# Patient Record
Sex: Male | Born: 1935 | Race: White | Hispanic: No | Marital: Married | Smoking: Former smoker
Health system: Southern US, Community
[De-identification: ages and names within clinical notes are randomized; demographics above are authoritative.]

## PROBLEM LIST (undated history)

## (undated) DIAGNOSIS — R011 Cardiac murmur, unspecified: Secondary | ICD-10-CM

## (undated) DIAGNOSIS — J45909 Unspecified asthma, uncomplicated: Secondary | ICD-10-CM

## (undated) DIAGNOSIS — C61 Malignant neoplasm of prostate: Secondary | ICD-10-CM

## (undated) DIAGNOSIS — E785 Hyperlipidemia, unspecified: Secondary | ICD-10-CM

## (undated) DIAGNOSIS — R0602 Shortness of breath: Secondary | ICD-10-CM

## (undated) DIAGNOSIS — M869 Osteomyelitis, unspecified: Secondary | ICD-10-CM

## (undated) HISTORY — DX: Osteomyelitis, unspecified: M86.9

## (undated) HISTORY — PX: EXCISIONAL HEMORRHOIDECTOMY: SHX1541

## (undated) HISTORY — DX: Malignant neoplasm of prostate: C61

## (undated) HISTORY — PX: TONSILLECTOMY: SUR1361

## (undated) HISTORY — DX: Hypocalcemia: E83.51

---

## 2004-12-29 ENCOUNTER — Ambulatory Visit: Payer: Self-pay | Admitting: Urology

## 2005-01-18 ENCOUNTER — Ambulatory Visit: Payer: Self-pay | Admitting: Radiation Oncology

## 2005-02-04 ENCOUNTER — Ambulatory Visit: Payer: Self-pay | Admitting: Radiation Oncology

## 2005-03-07 ENCOUNTER — Ambulatory Visit: Payer: Self-pay | Admitting: Radiation Oncology

## 2005-04-06 ENCOUNTER — Ambulatory Visit: Payer: Self-pay | Admitting: Radiation Oncology

## 2005-05-07 ENCOUNTER — Ambulatory Visit: Payer: Self-pay | Admitting: Radiation Oncology

## 2005-07-09 ENCOUNTER — Ambulatory Visit: Payer: Self-pay | Admitting: Radiation Oncology

## 2005-08-05 ENCOUNTER — Ambulatory Visit: Payer: Self-pay | Admitting: Radiation Oncology

## 2005-11-08 ENCOUNTER — Ambulatory Visit: Payer: Self-pay | Admitting: Radiation Oncology

## 2005-12-05 ENCOUNTER — Ambulatory Visit: Payer: Self-pay | Admitting: Radiation Oncology

## 2005-12-27 ENCOUNTER — Ambulatory Visit: Payer: Self-pay | Admitting: Urology

## 2006-01-09 ENCOUNTER — Ambulatory Visit: Payer: Self-pay | Admitting: Radiation Oncology

## 2006-02-04 ENCOUNTER — Ambulatory Visit: Payer: Self-pay | Admitting: Radiation Oncology

## 2006-08-14 ENCOUNTER — Ambulatory Visit: Payer: Self-pay | Admitting: Radiation Oncology

## 2006-09-05 ENCOUNTER — Ambulatory Visit: Payer: Self-pay | Admitting: Radiation Oncology

## 2006-10-14 ENCOUNTER — Ambulatory Visit: Payer: Self-pay | Admitting: Gastroenterology

## 2007-02-19 ENCOUNTER — Ambulatory Visit: Payer: Self-pay | Admitting: Radiation Oncology

## 2007-03-08 ENCOUNTER — Ambulatory Visit: Payer: Self-pay | Admitting: Radiation Oncology

## 2007-06-08 ENCOUNTER — Ambulatory Visit: Payer: Self-pay | Admitting: Radiation Oncology

## 2007-06-11 ENCOUNTER — Ambulatory Visit: Payer: Self-pay | Admitting: Radiation Oncology

## 2007-07-06 ENCOUNTER — Ambulatory Visit: Payer: Self-pay | Admitting: Radiation Oncology

## 2007-12-06 ENCOUNTER — Ambulatory Visit: Payer: Self-pay | Admitting: Radiation Oncology

## 2007-12-08 ENCOUNTER — Ambulatory Visit: Payer: Self-pay | Admitting: Radiation Oncology

## 2008-01-06 ENCOUNTER — Ambulatory Visit: Payer: Self-pay | Admitting: Radiation Oncology

## 2009-01-04 ENCOUNTER — Ambulatory Visit: Payer: Self-pay | Admitting: Gastroenterology

## 2009-02-04 ENCOUNTER — Ambulatory Visit: Payer: Self-pay | Admitting: Radiation Oncology

## 2009-03-07 ENCOUNTER — Ambulatory Visit: Payer: Self-pay | Admitting: Radiation Oncology

## 2009-08-05 ENCOUNTER — Ambulatory Visit: Payer: Self-pay | Admitting: Radiation Oncology

## 2009-08-10 ENCOUNTER — Ambulatory Visit: Payer: Self-pay | Admitting: Radiation Oncology

## 2009-09-04 ENCOUNTER — Ambulatory Visit: Payer: Self-pay | Admitting: Radiation Oncology

## 2010-01-05 ENCOUNTER — Ambulatory Visit: Payer: Self-pay | Admitting: Radiation Oncology

## 2010-01-11 ENCOUNTER — Ambulatory Visit: Payer: Self-pay | Admitting: Radiation Oncology

## 2010-02-04 ENCOUNTER — Ambulatory Visit: Payer: Self-pay | Admitting: Radiation Oncology

## 2010-02-15 ENCOUNTER — Ambulatory Visit: Payer: Self-pay | Admitting: Urology

## 2010-08-10 ENCOUNTER — Ambulatory Visit: Payer: Self-pay | Admitting: Radiation Oncology

## 2010-08-11 LAB — PSA

## 2010-09-05 ENCOUNTER — Ambulatory Visit: Payer: Self-pay | Admitting: Radiation Oncology

## 2010-12-27 ENCOUNTER — Ambulatory Visit: Payer: Self-pay | Admitting: Radiation Oncology

## 2011-01-06 ENCOUNTER — Ambulatory Visit: Payer: Self-pay | Admitting: Radiation Oncology

## 2011-02-05 ENCOUNTER — Ambulatory Visit: Payer: Self-pay | Admitting: Oncology

## 2011-04-27 ENCOUNTER — Ambulatory Visit: Payer: Self-pay | Admitting: Oncology

## 2011-05-08 ENCOUNTER — Ambulatory Visit: Payer: Self-pay | Admitting: Oncology

## 2011-07-05 ENCOUNTER — Ambulatory Visit: Payer: Self-pay | Admitting: Oncology

## 2011-07-06 ENCOUNTER — Ambulatory Visit: Payer: Self-pay | Admitting: Oncology

## 2011-08-24 ENCOUNTER — Ambulatory Visit: Payer: Self-pay | Admitting: Oncology

## 2011-08-24 LAB — COMPREHENSIVE METABOLIC PANEL
Alkaline Phosphatase: 94 U/L (ref 50–136)
Anion Gap: 9 (ref 7–16)
Calcium, Total: 9.2 mg/dL (ref 8.5–10.1)
Chloride: 102 mmol/L (ref 98–107)
Creatinine: 0.81 mg/dL (ref 0.60–1.30)
EGFR (African American): >60
EGFR (Non-African Amer.): >60
Glucose: 100 mg/dL — ABNORMAL HIGH (ref 65–99)
Osmolality: 271 (ref 275–301)
Sodium: 135 mmol/L — ABNORMAL LOW (ref 136–145)

## 2011-08-24 LAB — CBC CANCER CENTER
Eosinophil #: 0.1 x10 3/mm (ref 0.0–0.7)
Eosinophil %: 1.4 %
Lymphocyte %: 27.3 %
MCH: 30.7 pg (ref 26.0–34.0)
Monocyte #: 0.5 x10 3/mm (ref 0.2–1.0)
Monocyte %: 9.5 %
Neutrophil #: 3.3 x10 3/mm (ref 1.4–6.5)
WBC: 5.5 x10 3/mm (ref 3.8–10.6)

## 2011-08-26 LAB — PSA: PSA: 2.2 ng/mL (ref 0.0–4.0)

## 2011-09-05 ENCOUNTER — Ambulatory Visit: Payer: Self-pay | Admitting: Oncology

## 2012-01-11 ENCOUNTER — Ambulatory Visit: Payer: Self-pay | Admitting: Oncology

## 2012-01-11 LAB — CBC CANCER CENTER
Basophil #: 0.1 x10 3/mm (ref 0.0–0.1)
Basophil %: 1 %
Eosinophil #: 0.1 x10 3/mm (ref 0.0–0.7)
HGB: 12.4 g/dL — ABNORMAL LOW (ref 13.0–18.0)
Lymphocyte #: 1.7 x10 3/mm (ref 1.0–3.6)
Lymphocyte %: 29.1 %
MCH: 30.8 pg (ref 26.0–34.0)
MCHC: 33.6 g/dL (ref 32.0–36.0)
Monocyte #: 0.5 x10 3/mm (ref 0.2–1.0)
Neutrophil #: 3.5 x10 3/mm (ref 1.4–6.5)
Neutrophil %: 60.1 %
Platelet: 217 x10 3/mm (ref 150–440)
RDW: 14.1 % (ref 11.5–14.5)

## 2012-01-11 LAB — COMPREHENSIVE METABOLIC PANEL
Bilirubin,Total: 0.4 mg/dL (ref 0.2–1.0)
Calcium, Total: 10 mg/dL (ref 8.5–10.1)
Chloride: 103 mmol/L (ref 98–107)
Co2: 26 mmol/L (ref 21–32)
Creatinine: 0.8 mg/dL (ref 0.60–1.30)
EGFR (African American): >60
EGFR (Non-African Amer.): >60
Osmolality: 275 (ref 275–301)
Potassium: 4.6 mmol/L (ref 3.5–5.1)
SGPT (ALT): 18 U/L (ref 12–78)
Sodium: 137 mmol/L (ref 136–145)

## 2012-01-12 LAB — PSA: PSA: 6.8 ng/mL — ABNORMAL HIGH (ref 0.0–4.0)

## 2012-02-05 ENCOUNTER — Ambulatory Visit: Payer: Self-pay | Admitting: Oncology

## 2012-02-19 ENCOUNTER — Ambulatory Visit: Payer: Self-pay | Admitting: Gastroenterology

## 2012-02-21 LAB — PATHOLOGY REPORT

## 2012-03-07 ENCOUNTER — Ambulatory Visit: Payer: Self-pay | Admitting: Oncology

## 2012-04-24 ENCOUNTER — Ambulatory Visit: Payer: Self-pay | Admitting: Oncology

## 2012-04-24 LAB — CBC CANCER CENTER
Basophil %: 0.8 %
Eosinophil %: 1.1 %
HCT: 36.6 % — ABNORMAL LOW (ref 40.0–52.0)
HGB: 12.6 g/dL — ABNORMAL LOW (ref 13.0–18.0)
Lymphocyte #: 1.9 x10 3/mm (ref 1.0–3.6)
MCHC: 34.5 g/dL (ref 32.0–36.0)
Monocyte #: 0.6 x10 3/mm (ref 0.2–1.0)
Neutrophil #: 5 x10 3/mm (ref 1.4–6.5)
Platelet: 208 x10 3/mm (ref 150–440)
RDW: 14.3 % (ref 11.5–14.5)
WBC: 7.7 x10 3/mm (ref 3.8–10.6)

## 2012-04-24 LAB — COMPREHENSIVE METABOLIC PANEL
Albumin: 3.7 g/dL (ref 3.4–5.0)
Anion Gap: 12 (ref 7–16)
BUN: 17 mg/dL (ref 7–18)
Bilirubin,Total: 0.9 mg/dL (ref 0.2–1.0)
Calcium, Total: 8.9 mg/dL (ref 8.5–10.1)
Chloride: 106 mmol/L (ref 98–107)
Co2: 26 mmol/L (ref 21–32)
EGFR (African American): >60
Osmolality: 288 (ref 275–301)
Potassium: 3.8 mmol/L (ref 3.5–5.1)
SGOT(AST): 16 U/L (ref 15–37)
Total Protein: 7.2 g/dL (ref 6.4–8.2)

## 2012-04-25 LAB — PSA: PSA: 1.9 ng/mL (ref 0.0–4.0)

## 2012-05-07 ENCOUNTER — Ambulatory Visit: Payer: Self-pay | Admitting: Oncology

## 2012-06-13 ENCOUNTER — Ambulatory Visit: Payer: Self-pay | Admitting: Oncology

## 2012-07-05 ENCOUNTER — Ambulatory Visit: Payer: Self-pay | Admitting: Oncology

## 2012-09-01 ENCOUNTER — Inpatient Hospital Stay: Payer: Self-pay | Admitting: Specialist

## 2012-09-01 LAB — CBC
HCT: 35 % — ABNORMAL LOW (ref 40.0–52.0)
MCV: 91 fL (ref 80–100)
Platelet: 245 10*3/uL (ref 150–440)
RDW: 13.3 % (ref 11.5–14.5)
WBC: 8.1 10*3/uL (ref 3.8–10.6)

## 2012-09-01 LAB — COMPREHENSIVE METABOLIC PANEL
Albumin: 3.6 g/dL (ref 3.4–5.0)
Alkaline Phosphatase: 90 U/L (ref 50–136)
Anion Gap: 9 (ref 7–16)
BUN: 13 mg/dL (ref 7–18)
Bilirubin,Total: 0.8 mg/dL (ref 0.2–1.0)
Calcium, Total: 9 mg/dL (ref 8.5–10.1)
EGFR (African American): >60
EGFR (Non-African Amer.): >60

## 2012-09-01 LAB — URINALYSIS, COMPLETE
Blood: NEGATIVE
Glucose,UR: NEGATIVE mg/dL (ref 0–75)
Ph: 8 (ref 4.5–8.0)
Protein: NEGATIVE
RBC,UR: 3 /HPF (ref 0–5)

## 2012-09-02 LAB — CBC WITH DIFFERENTIAL/PLATELET
Eosinophil #: 0 10*3/uL (ref 0.0–0.7)
Eosinophil %: 0 %
HCT: 28.2 % — ABNORMAL LOW (ref 40.0–52.0)
Lymphocyte #: 0.7 10*3/uL — ABNORMAL LOW (ref 1.0–3.6)
MCHC: 35.4 g/dL (ref 32.0–36.0)
MCV: 90 fL (ref 80–100)
Monocyte #: 0.5 x10 3/mm (ref 0.2–1.0)
Monocyte %: 6 %
Neutrophil #: 7.3 10*3/uL — ABNORMAL HIGH (ref 1.4–6.5)
Neutrophil %: 85.9 %
Platelet: 193 10*3/uL (ref 150–440)
RBC: 3.12 10*6/uL — ABNORMAL LOW (ref 4.40–5.90)
RDW: 13.7 % (ref 11.5–14.5)

## 2012-09-02 LAB — BASIC METABOLIC PANEL
BUN: 12 mg/dL (ref 7–18)
Calcium, Total: 8.2 mg/dL — ABNORMAL LOW (ref 8.5–10.1)
Chloride: 106 mmol/L (ref 98–107)
EGFR (Non-African Amer.): >60
Glucose: 130 mg/dL — ABNORMAL HIGH (ref 65–99)
Osmolality: 275 (ref 275–301)
Potassium: 4 mmol/L (ref 3.5–5.1)

## 2012-09-04 ENCOUNTER — Encounter: Payer: Self-pay | Admitting: Endocrinology

## 2012-09-04 LAB — CBC WITH DIFFERENTIAL/PLATELET
Eosinophil #: 0 10*3/uL (ref 0.0–0.7)
Eosinophil %: 0.4 %
HGB: 9.7 g/dL — ABNORMAL LOW (ref 13.0–18.0)
Lymphocyte #: 1.9 10*3/uL (ref 1.0–3.6)
Lymphocyte %: 25 %
Monocyte #: 0.6 x10 3/mm (ref 0.2–1.0)
Monocyte %: 8.2 %
Neutrophil #: 4.9 10*3/uL (ref 1.4–6.5)
Platelet: 194 10*3/uL (ref 150–440)
RBC: 3.06 10*6/uL — ABNORMAL LOW (ref 4.40–5.90)

## 2012-09-04 LAB — BASIC METABOLIC PANEL
Calcium, Total: 8.6 mg/dL (ref 8.5–10.1)
Chloride: 106 mmol/L (ref 98–107)
Co2: 24 mmol/L (ref 21–32)
Creatinine: 0.67 mg/dL (ref 0.60–1.30)
EGFR (African American): >60
EGFR (Non-African Amer.): >60
Glucose: 91 mg/dL (ref 65–99)
Osmolality: 275 (ref 275–301)
Potassium: 3.8 mmol/L (ref 3.5–5.1)

## 2012-09-18 LAB — CBC WITH DIFFERENTIAL/PLATELET
Basophil %: 1.1 %
Eosinophil #: 0.1 10*3/uL (ref 0.0–0.7)
HCT: 34.1 % — ABNORMAL LOW (ref 40.0–52.0)
Lymphocyte #: 1.7 10*3/uL (ref 1.0–3.6)
Lymphocyte %: 20.6 %
MCV: 91 fL (ref 80–100)
Monocyte #: 0.5 x10 3/mm (ref 0.2–1.0)
Monocyte %: 6.5 %
Neutrophil #: 5.8 10*3/uL (ref 1.4–6.5)
Neutrophil %: 70.8 %
WBC: 8.1 10*3/uL (ref 3.8–10.6)

## 2012-09-18 LAB — BASIC METABOLIC PANEL
BUN: 14 mg/dL (ref 7–18)
Calcium, Total: 8.9 mg/dL (ref 8.5–10.1)
Co2: 23 mmol/L (ref 21–32)
EGFR (African American): >60
EGFR (Non-African Amer.): >60
Potassium: 4.2 mmol/L (ref 3.5–5.1)

## 2012-09-19 ENCOUNTER — Ambulatory Visit: Payer: Self-pay | Admitting: Gerontology

## 2012-10-10 ENCOUNTER — Ambulatory Visit: Payer: Self-pay | Admitting: Oncology

## 2012-10-10 LAB — COMPREHENSIVE METABOLIC PANEL
Albumin: 3.3 g/dL — ABNORMAL LOW (ref 3.4–5.0)
BUN: 12 mg/dL (ref 7–18)
Bilirubin,Total: 0.5 mg/dL (ref 0.2–1.0)
Calcium, Total: 8.4 mg/dL — ABNORMAL LOW (ref 8.5–10.1)
Chloride: 105 mmol/L (ref 98–107)
Co2: 30 mmol/L (ref 21–32)
Creatinine: 0.8 mg/dL (ref 0.60–1.30)
EGFR (African American): >60
Glucose: 98 mg/dL (ref 65–99)
Osmolality: 285 (ref 275–301)
Potassium: 3.8 mmol/L (ref 3.5–5.1)

## 2012-10-10 LAB — CBC CANCER CENTER
Basophil %: 1.1 %
Eosinophil #: 0.1 x10 3/mm (ref 0.0–0.7)
Eosinophil %: 1.5 %
HCT: 34.3 % — ABNORMAL LOW (ref 40.0–52.0)
HGB: 11.3 g/dL — ABNORMAL LOW (ref 13.0–18.0)
Lymphocyte #: 1.5 x10 3/mm (ref 1.0–3.6)
Lymphocyte %: 21 %
MCHC: 33.1 g/dL (ref 32.0–36.0)
MCV: 90 fL (ref 80–100)
Monocyte #: 0.5 x10 3/mm (ref 0.2–1.0)
Monocyte %: 7.6 %
Neutrophil #: 4.9 x10 3/mm (ref 1.4–6.5)
Neutrophil %: 68.8 %
RBC: 3.82 10*6/uL — ABNORMAL LOW (ref 4.40–5.90)
RDW: 13.8 % (ref 11.5–14.5)

## 2012-11-04 ENCOUNTER — Ambulatory Visit: Payer: Self-pay | Admitting: Oncology

## 2012-12-05 ENCOUNTER — Ambulatory Visit: Payer: Self-pay | Admitting: Oncology

## 2013-01-05 ENCOUNTER — Ambulatory Visit: Payer: Self-pay | Admitting: Oncology

## 2013-01-09 LAB — COMPREHENSIVE METABOLIC PANEL
Albumin: 3.6 g/dL (ref 3.4–5.0)
BUN: 15 mg/dL (ref 7–18)
Bilirubin,Total: 0.6 mg/dL (ref 0.2–1.0)
Co2: 30 mmol/L (ref 21–32)
EGFR (African American): >60
SGOT(AST): 14 U/L — ABNORMAL LOW (ref 15–37)

## 2013-01-09 LAB — CBC CANCER CENTER
HCT: 35.4 % — ABNORMAL LOW (ref 40.0–52.0)
Lymphocyte #: 1.8 x10 3/mm (ref 1.0–3.6)
Lymphocyte %: 24.2 %
MCH: 29.5 pg (ref 26.0–34.0)
MCHC: 33.2 g/dL (ref 32.0–36.0)
MCV: 89 fL (ref 80–100)
Neutrophil #: 5 x10 3/mm (ref 1.4–6.5)
Platelet: 218 x10 3/mm (ref 150–440)
RDW: 15.4 % — ABNORMAL HIGH (ref 11.5–14.5)
WBC: 7.4 x10 3/mm (ref 3.8–10.6)

## 2013-02-04 ENCOUNTER — Ambulatory Visit: Payer: Self-pay | Admitting: Oncology

## 2013-02-20 LAB — COMPREHENSIVE METABOLIC PANEL WITH GFR
Albumin: 3.4 g/dL
Alkaline Phosphatase: 99 U/L
Anion Gap: 10
BUN: 14 mg/dL
Bilirubin,Total: 0.8 mg/dL
Calcium, Total: 8.8 mg/dL
Chloride: 105 mmol/L
Co2: 28 mmol/L
Creatinine: 0.8 mg/dL
EGFR (African American): >60
EGFR (Non-African Amer.): >60
Glucose: 91 mg/dL
Osmolality: 285
Potassium: 3.8 mmol/L
SGOT(AST): 15 U/L
SGPT (ALT): 22 U/L
Sodium: 143 mmol/L
Total Protein: 7.1 g/dL

## 2013-02-20 LAB — CBC CANCER CENTER
Basophil #: 0.1 "x10 3/mm "
Basophil %: 1.1 %
Eosinophil #: 0.1 "x10 3/mm "
Eosinophil %: 1.2 %
HCT: 37 % — ABNORMAL LOW
HGB: 12.4 g/dL — ABNORMAL LOW
Lymphocyte %: 30 %
Lymphs Abs: 1.8 "x10 3/mm "
MCH: 30.4 pg
MCHC: 33.5 g/dL
MCV: 91 fL
Monocyte #: 0.4 "x10 3/mm "
Monocyte %: 6.7 %
Neutrophil #: 3.8 "x10 3/mm "
Neutrophil %: 61 %
Platelet: 209 "x10 3/mm "
RBC: 4.08 "x10 6/mm " — ABNORMAL LOW
RDW: 15 % — ABNORMAL HIGH
WBC: 6.2 "x10 3/mm "

## 2013-03-07 ENCOUNTER — Ambulatory Visit: Payer: Self-pay | Admitting: Oncology

## 2013-04-24 ENCOUNTER — Ambulatory Visit: Payer: Self-pay | Admitting: Oncology

## 2013-04-24 LAB — COMPREHENSIVE METABOLIC PANEL
Albumin: 3.8 g/dL (ref 3.4–5.0)
Alkaline Phosphatase: 87 U/L
Anion Gap: 9 (ref 7–16)
Bilirubin,Total: 0.9 mg/dL (ref 0.2–1.0)
Calcium, Total: 9.5 mg/dL (ref 8.5–10.1)
Chloride: 103 mmol/L (ref 98–107)
Co2: 28 mmol/L (ref 21–32)
Creatinine: 0.87 mg/dL (ref 0.60–1.30)
EGFR (African American): >60
EGFR (Non-African Amer.): >60
Glucose: 97 mg/dL (ref 65–99)
Osmolality: 281 (ref 275–301)
Total Protein: 7.5 g/dL (ref 6.4–8.2)

## 2013-04-24 LAB — CBC CANCER CENTER
Basophil #: 0.1 x10 3/mm (ref 0.0–0.1)
Basophil %: 0.9 %
HCT: 37.6 % — ABNORMAL LOW (ref 40.0–52.0)
Lymphocyte #: 2.4 x10 3/mm (ref 1.0–3.6)
Lymphocyte %: 26.1 %
MCHC: 33.6 g/dL (ref 32.0–36.0)
MCV: 91 fL (ref 80–100)
Neutrophil #: 5.9 x10 3/mm (ref 1.4–6.5)
Neutrophil %: 65 %
Platelet: 227 x10 3/mm (ref 150–440)
RBC: 4.13 10*6/uL — ABNORMAL LOW (ref 4.40–5.90)

## 2013-05-07 ENCOUNTER — Ambulatory Visit: Payer: Self-pay | Admitting: Oncology

## 2013-07-03 ENCOUNTER — Ambulatory Visit: Payer: Self-pay | Admitting: Oncology

## 2013-07-03 LAB — CBC CANCER CENTER
Basophil #: 0.1 x10 3/mm (ref 0.0–0.1)
Basophil %: 0.9 %
EOS PCT: 1.4 %
Eosinophil #: 0.1 x10 3/mm (ref 0.0–0.7)
HCT: 35.9 % — ABNORMAL LOW (ref 40.0–52.0)
HGB: 12.1 g/dL — AB (ref 13.0–18.0)
Lymphocyte #: 2.4 x10 3/mm (ref 1.0–3.6)
Lymphocyte %: 37 %
MCH: 30.7 pg (ref 26.0–34.0)
MCHC: 33.6 g/dL (ref 32.0–36.0)
MCV: 91 fL (ref 80–100)
Monocyte #: 0.4 x10 3/mm (ref 0.2–1.0)
Monocyte %: 6.8 %
NEUTROS PCT: 53.9 %
Neutrophil #: 3.4 x10 3/mm (ref 1.4–6.5)
PLATELETS: 240 x10 3/mm (ref 150–440)
RBC: 3.93 10*6/uL — ABNORMAL LOW (ref 4.40–5.90)
RDW: 14.1 % (ref 11.5–14.5)
WBC: 6.4 x10 3/mm (ref 3.8–10.6)

## 2013-07-03 LAB — COMPREHENSIVE METABOLIC PANEL
ALBUMIN: 3.5 g/dL (ref 3.4–5.0)
ALK PHOS: 90 U/L
ANION GAP: 9 (ref 7–16)
BILIRUBIN TOTAL: 0.7 mg/dL (ref 0.2–1.0)
BUN: 11 mg/dL (ref 7–18)
Calcium, Total: 8.9 mg/dL (ref 8.5–10.1)
Chloride: 105 mmol/L (ref 98–107)
Co2: 27 mmol/L (ref 21–32)
Creatinine: 0.81 mg/dL (ref 0.60–1.30)
EGFR (African American): >60
GLUCOSE: 99 mg/dL (ref 65–99)
Osmolality: 281 (ref 275–301)
POTASSIUM: 3.9 mmol/L (ref 3.5–5.1)
SGOT(AST): 15 U/L (ref 15–37)
SGPT (ALT): 22 U/L (ref 12–78)
Sodium: 141 mmol/L (ref 136–145)
Total Protein: 7 g/dL (ref 6.4–8.2)

## 2013-07-04 LAB — PSA: PSA: 0.1 ng/mL

## 2013-07-05 ENCOUNTER — Ambulatory Visit: Payer: Self-pay | Admitting: Oncology

## 2013-09-18 ENCOUNTER — Ambulatory Visit: Payer: Self-pay | Admitting: Oncology

## 2013-09-18 LAB — CBC CANCER CENTER
Basophil #: 0 x10 3/mm (ref 0.0–0.1)
Basophil %: 0.6 %
EOS ABS: 0.1 x10 3/mm (ref 0.0–0.7)
Eosinophil %: 1 %
HCT: 38.3 % — AB (ref 40.0–52.0)
HGB: 12.8 g/dL — ABNORMAL LOW (ref 13.0–18.0)
LYMPHS ABS: 2.1 x10 3/mm (ref 1.0–3.6)
Lymphocyte %: 33.2 %
MCH: 30.7 pg (ref 26.0–34.0)
MCHC: 33.5 g/dL (ref 32.0–36.0)
MCV: 92 fL (ref 80–100)
Monocyte #: 0.3 x10 3/mm (ref 0.2–1.0)
Monocyte %: 5.1 %
NEUTROS ABS: 3.8 x10 3/mm (ref 1.4–6.5)
Neutrophil %: 60.1 %
Platelet: 235 x10 3/mm (ref 150–440)
RBC: 4.18 10*6/uL — ABNORMAL LOW (ref 4.40–5.90)
RDW: 14 % (ref 11.5–14.5)
WBC: 6.3 x10 3/mm (ref 3.8–10.6)

## 2013-09-18 LAB — COMPREHENSIVE METABOLIC PANEL
ALK PHOS: 88 U/L
ANION GAP: 7 (ref 7–16)
AST: 21 U/L (ref 15–37)
Albumin: 3.6 g/dL (ref 3.4–5.0)
BUN: 12 mg/dL (ref 7–18)
Bilirubin,Total: 0.9 mg/dL (ref 0.2–1.0)
CALCIUM: 8.9 mg/dL (ref 8.5–10.1)
CREATININE: 0.87 mg/dL (ref 0.60–1.30)
Chloride: 104 mmol/L (ref 98–107)
Co2: 30 mmol/L (ref 21–32)
EGFR (African American): >60
EGFR (Non-African Amer.): >60
GLUCOSE: 127 mg/dL — AB (ref 65–99)
OSMOLALITY: 283 (ref 275–301)
Potassium: 3.8 mmol/L (ref 3.5–5.1)
SGPT (ALT): 26 U/L (ref 12–78)
Sodium: 141 mmol/L (ref 136–145)
TOTAL PROTEIN: 7.1 g/dL (ref 6.4–8.2)

## 2013-09-21 LAB — PSA: PSA: 0.3 ng/mL (ref 0.0–4.0)

## 2013-10-05 ENCOUNTER — Ambulatory Visit: Payer: Self-pay | Admitting: Oncology

## 2013-12-18 ENCOUNTER — Ambulatory Visit: Payer: Self-pay | Admitting: Oncology

## 2013-12-18 LAB — CBC CANCER CENTER
Basophil #: 0 x10 3/mm (ref 0.0–0.1)
Basophil %: 0.5 %
EOS PCT: 1.1 %
Eosinophil #: 0.1 x10 3/mm (ref 0.0–0.7)
HCT: 36.9 % — ABNORMAL LOW (ref 40.0–52.0)
HGB: 12.1 g/dL — AB (ref 13.0–18.0)
LYMPHS ABS: 2.2 x10 3/mm (ref 1.0–3.6)
Lymphocyte %: 28 %
MCH: 30.6 pg (ref 26.0–34.0)
MCHC: 32.9 g/dL (ref 32.0–36.0)
MCV: 93 fL (ref 80–100)
MONOS PCT: 6.7 %
Monocyte #: 0.5 x10 3/mm (ref 0.2–1.0)
NEUTROS ABS: 4.9 x10 3/mm (ref 1.4–6.5)
Neutrophil %: 63.7 %
Platelet: 230 x10 3/mm (ref 150–440)
RBC: 3.97 10*6/uL — AB (ref 4.40–5.90)
RDW: 14.5 % (ref 11.5–14.5)
WBC: 7.7 x10 3/mm (ref 3.8–10.6)

## 2013-12-18 LAB — COMPREHENSIVE METABOLIC PANEL
ALBUMIN: 3.5 g/dL (ref 3.4–5.0)
ALT: 22 U/L
Alkaline Phosphatase: 74 U/L
Anion Gap: 7 (ref 7–16)
BUN: 15 mg/dL (ref 7–18)
Bilirubin,Total: 0.6 mg/dL (ref 0.2–1.0)
CHLORIDE: 101 mmol/L (ref 98–107)
CO2: 31 mmol/L (ref 21–32)
Calcium, Total: 9.1 mg/dL (ref 8.5–10.1)
Creatinine: 0.71 mg/dL (ref 0.60–1.30)
EGFR (Non-African Amer.): >60
GLUCOSE: 91 mg/dL (ref 65–99)
OSMOLALITY: 278 (ref 275–301)
Potassium: 3.8 mmol/L (ref 3.5–5.1)
SGOT(AST): 12 U/L — ABNORMAL LOW (ref 15–37)
Sodium: 139 mmol/L (ref 136–145)
Total Protein: 6.9 g/dL (ref 6.4–8.2)

## 2013-12-21 LAB — PSA: PSA: 1.1 ng/mL (ref 0.0–4.0)

## 2014-01-05 ENCOUNTER — Ambulatory Visit: Payer: Self-pay | Admitting: Oncology

## 2014-01-15 LAB — COMPREHENSIVE METABOLIC PANEL
ALK PHOS: 81 U/L
AST: 12 U/L — AB (ref 15–37)
Albumin: 3.4 g/dL (ref 3.4–5.0)
Anion Gap: 8 (ref 7–16)
BUN: 15 mg/dL (ref 7–18)
Bilirubin,Total: 0.7 mg/dL (ref 0.2–1.0)
CHLORIDE: 102 mmol/L (ref 98–107)
Calcium, Total: 8.8 mg/dL (ref 8.5–10.1)
Co2: 29 mmol/L (ref 21–32)
Creatinine: 0.73 mg/dL (ref 0.60–1.30)
EGFR (African American): >60
EGFR (Non-African Amer.): >60
GLUCOSE: 95 mg/dL (ref 65–99)
Osmolality: 278 (ref 275–301)
Potassium: 4 mmol/L (ref 3.5–5.1)
SGPT (ALT): 18 U/L
Sodium: 139 mmol/L (ref 136–145)
Total Protein: 6.6 g/dL (ref 6.4–8.2)

## 2014-01-15 LAB — CBC CANCER CENTER
Basophil #: 0 x10 3/mm (ref 0.0–0.1)
Basophil %: 0.5 %
EOS ABS: 0.1 x10 3/mm (ref 0.0–0.7)
EOS PCT: 0.9 %
HCT: 36.3 % — AB (ref 40.0–52.0)
HGB: 12.1 g/dL — ABNORMAL LOW (ref 13.0–18.0)
Lymphocyte #: 2.3 x10 3/mm (ref 1.0–3.6)
Lymphocyte %: 27.8 %
MCH: 30.9 pg (ref 26.0–34.0)
MCHC: 33.3 g/dL (ref 32.0–36.0)
MCV: 93 fL (ref 80–100)
MONO ABS: 0.6 x10 3/mm (ref 0.2–1.0)
Monocyte %: 7.4 %
Neutrophil #: 5.3 x10 3/mm (ref 1.4–6.5)
Neutrophil %: 63.4 %
Platelet: 239 x10 3/mm (ref 150–440)
RBC: 3.91 10*6/uL — ABNORMAL LOW (ref 4.40–5.90)
RDW: 13.9 % (ref 11.5–14.5)
WBC: 8.3 x10 3/mm (ref 3.8–10.6)

## 2014-01-18 LAB — PSA: PSA: 1.7 ng/mL (ref 0.0–4.0)

## 2014-01-20 ENCOUNTER — Ambulatory Visit: Payer: Self-pay | Admitting: Family Medicine

## 2014-02-04 ENCOUNTER — Ambulatory Visit: Payer: Self-pay | Admitting: Oncology

## 2014-03-05 LAB — COMPREHENSIVE METABOLIC PANEL
AST: 14 U/L — AB (ref 15–37)
Albumin: 3.7 g/dL (ref 3.4–5.0)
Alkaline Phosphatase: 99 U/L
Anion Gap: 7 (ref 7–16)
BUN: 16 mg/dL (ref 7–18)
Bilirubin,Total: 0.8 mg/dL (ref 0.2–1.0)
CHLORIDE: 102 mmol/L (ref 98–107)
CO2: 30 mmol/L (ref 21–32)
Calcium, Total: 9.1 mg/dL (ref 8.5–10.1)
Creatinine: 0.85 mg/dL (ref 0.60–1.30)
EGFR (African American): >60
EGFR (Non-African Amer.): >60
GLUCOSE: 106 mg/dL — AB (ref 65–99)
Osmolality: 279 (ref 275–301)
POTASSIUM: 4.1 mmol/L (ref 3.5–5.1)
SGPT (ALT): 19 U/L
SODIUM: 139 mmol/L (ref 136–145)
Total Protein: 6.9 g/dL (ref 6.4–8.2)

## 2014-03-05 LAB — CBC CANCER CENTER
BASOS PCT: 0.8 %
Basophil #: 0.1 x10 3/mm (ref 0.0–0.1)
Eosinophil #: 0.2 x10 3/mm (ref 0.0–0.7)
Eosinophil %: 2.5 %
HCT: 35.9 % — ABNORMAL LOW (ref 40.0–52.0)
HGB: 12.1 g/dL — AB (ref 13.0–18.0)
Lymphocyte #: 2.1 x10 3/mm (ref 1.0–3.6)
Lymphocyte %: 34.4 %
MCH: 30.9 pg (ref 26.0–34.0)
MCHC: 33.7 g/dL (ref 32.0–36.0)
MCV: 92 fL (ref 80–100)
MONO ABS: 0.5 x10 3/mm (ref 0.2–1.0)
MONOS PCT: 7.8 %
Neutrophil #: 3.3 x10 3/mm (ref 1.4–6.5)
Neutrophil %: 54.5 %
Platelet: 250 x10 3/mm (ref 150–440)
RBC: 3.92 10*6/uL — AB (ref 4.40–5.90)
RDW: 13.5 % (ref 11.5–14.5)
WBC: 6.1 x10 3/mm (ref 3.8–10.6)

## 2014-03-07 ENCOUNTER — Ambulatory Visit: Payer: Self-pay | Admitting: Oncology

## 2014-03-08 LAB — PSA: PSA: 1.1 ng/mL (ref 0.0–4.0)

## 2014-04-26 ENCOUNTER — Ambulatory Visit: Payer: Self-pay | Admitting: Oncology

## 2014-04-26 LAB — COMPREHENSIVE METABOLIC PANEL
ALBUMIN: 3.7 g/dL (ref 3.4–5.0)
ALK PHOS: 93 U/L
ALT: 15 U/L
ANION GAP: 10 (ref 7–16)
BUN: 13 mg/dL (ref 7–18)
Bilirubin,Total: 0.5 mg/dL (ref 0.2–1.0)
Calcium, Total: 8.6 mg/dL (ref 8.5–10.1)
Chloride: 105 mmol/L (ref 98–107)
Co2: 26 mmol/L (ref 21–32)
Creatinine: 0.66 mg/dL (ref 0.60–1.30)
EGFR (African American): >60
EGFR (Non-African Amer.): >60
Glucose: 95 mg/dL (ref 65–99)
OSMOLALITY: 281 (ref 275–301)
Potassium: 4.4 mmol/L (ref 3.5–5.1)
SGOT(AST): 13 U/L — ABNORMAL LOW (ref 15–37)
Sodium: 141 mmol/L (ref 136–145)
Total Protein: 6.7 g/dL (ref 6.4–8.2)

## 2014-04-26 LAB — CBC CANCER CENTER
BASOS ABS: 0.1 x10 3/mm (ref 0.0–0.1)
BASOS PCT: 0.9 %
EOS ABS: 0.1 x10 3/mm (ref 0.0–0.7)
EOS PCT: 1.7 %
HCT: 36.7 % — ABNORMAL LOW (ref 40.0–52.0)
HGB: 12.1 g/dL — AB (ref 13.0–18.0)
LYMPHS ABS: 2 x10 3/mm (ref 1.0–3.6)
LYMPHS PCT: 35.4 %
MCH: 30.3 pg (ref 26.0–34.0)
MCHC: 32.9 g/dL (ref 32.0–36.0)
MCV: 92 fL (ref 80–100)
Monocyte #: 0.5 x10 3/mm (ref 0.2–1.0)
Monocyte %: 8.4 %
NEUTROS ABS: 3.1 x10 3/mm (ref 1.4–6.5)
NEUTROS PCT: 53.6 %
Platelet: 237 x10 3/mm (ref 150–440)
RBC: 3.98 10*6/uL — AB (ref 4.40–5.90)
RDW: 14.4 % (ref 11.5–14.5)
WBC: 5.7 x10 3/mm (ref 3.8–10.6)

## 2014-04-27 LAB — PSA: PSA: 1 ng/mL (ref 0.0–4.0)

## 2014-05-07 ENCOUNTER — Ambulatory Visit: Payer: Self-pay | Admitting: Oncology

## 2014-07-29 ENCOUNTER — Ambulatory Visit
Admit: 2014-07-29 | Disposition: A | Discharge status: Home / Self Care | Payer: Self-pay | Attending: Oncology | Admitting: Oncology

## 2014-07-29 LAB — COMPREHENSIVE METABOLIC PANEL
ALBUMIN: 4.1 g/dL
ALK PHOS: 71 U/L
ANION GAP: 8 (ref 7–16)
BUN: 16 mg/dL
Bilirubin,Total: 0.6 mg/dL
CREATININE: 0.74 mg/dL
Calcium, Total: 8.7 mg/dL — ABNORMAL LOW
Chloride: 106 mmol/L
Co2: 24 mmol/L
EGFR (African American): >60
EGFR (Non-African Amer.): >60
GLUCOSE: 118 mg/dL — AB
Potassium: 3.9 mmol/L
SGOT(AST): 22 U/L
SGPT (ALT): 17 U/L
Sodium: 138 mmol/L
TOTAL PROTEIN: 6.9 g/dL

## 2014-07-29 LAB — CBC CANCER CENTER
Basophil #: 0 x10 3/mm (ref 0.0–0.1)
Basophil %: 0.8 %
Eosinophil #: 0.1 x10 3/mm (ref 0.0–0.7)
Eosinophil %: 2.4 %
HCT: 34.7 % — AB (ref 40.0–52.0)
HGB: 11.7 g/dL — ABNORMAL LOW (ref 13.0–18.0)
Lymphocyte #: 2 x10 3/mm (ref 1.0–3.6)
Lymphocyte %: 37.5 %
MCH: 30.9 pg (ref 26.0–34.0)
MCHC: 33.6 g/dL (ref 32.0–36.0)
MCV: 92 fL (ref 80–100)
MONO ABS: 0.5 x10 3/mm (ref 0.2–1.0)
MONOS PCT: 9.3 %
NEUTROS PCT: 50 %
Neutrophil #: 2.6 x10 3/mm (ref 1.4–6.5)
PLATELETS: 226 x10 3/mm (ref 150–440)
RBC: 3.77 10*6/uL — AB (ref 4.40–5.90)
RDW: 14 % (ref 11.5–14.5)
WBC: 5.2 x10 3/mm (ref 3.8–10.6)

## 2014-07-30 LAB — PSA: PSA: 1.6 ng/mL (ref 0.0–4.0)

## 2014-08-06 ENCOUNTER — Ambulatory Visit
Admit: 2014-08-06 | Disposition: A | Discharge status: Home / Self Care | Payer: Self-pay | Attending: Oncology | Admitting: Oncology

## 2014-08-25 LAB — CBC CANCER CENTER
BASOS PCT: 0.9 %
Basophil #: 0.1 x10 3/mm (ref 0.0–0.1)
EOS PCT: 2.8 %
Eosinophil #: 0.2 x10 3/mm (ref 0.0–0.7)
HCT: 35.5 % — ABNORMAL LOW (ref 40.0–52.0)
HGB: 11.8 g/dL — ABNORMAL LOW (ref 13.0–18.0)
LYMPHS PCT: 33.5 %
Lymphocyte #: 2 x10 3/mm (ref 1.0–3.6)
MCH: 30.6 pg (ref 26.0–34.0)
MCHC: 33.3 g/dL (ref 32.0–36.0)
MCV: 92 fL (ref 80–100)
MONO ABS: 0.5 x10 3/mm (ref 0.2–1.0)
Monocyte %: 7.7 %
NEUTROS PCT: 55.1 %
Neutrophil #: 3.3 x10 3/mm (ref 1.4–6.5)
PLATELETS: 226 x10 3/mm (ref 150–440)
RBC: 3.87 10*6/uL — ABNORMAL LOW (ref 4.40–5.90)
RDW: 13.8 % (ref 11.5–14.5)
WBC: 6 x10 3/mm (ref 3.8–10.6)

## 2014-08-25 LAB — COMPREHENSIVE METABOLIC PANEL
ALK PHOS: 73 U/L
AST: 18 U/L
Albumin: 3.9 g/dL
Anion Gap: 7 (ref 7–16)
BUN: 19 mg/dL
Bilirubin,Total: 0.7 mg/dL
CALCIUM: 9 mg/dL
CHLORIDE: 106 mmol/L
CO2: 25 mmol/L
CREATININE: 0.53 mg/dL — AB
EGFR (African American): >60
Glucose: 138 mg/dL — ABNORMAL HIGH
POTASSIUM: 4.1 mmol/L
SGPT (ALT): 13 U/L — ABNORMAL LOW
Sodium: 138 mmol/L
Total Protein: 6.8 g/dL

## 2014-08-26 LAB — PSA: PSA: 2 ng/mL (ref 0.0–4.0)

## 2014-08-27 NOTE — Consult Note (Signed)
PATIENT NAME:  Preston Wilson, Preston Wilson MR#:  [id number] DATE OF BIRTH:  07-Jun-1935  INTERNAL MEDICINE CONSULTATION  DATE OF CONSULTATION:  09/01/2012  CONSULTING PHYSICIAN:  Vivien Presto, MD  REFERRING PHYSICIAN: Dr. Sabra Heck, from orthopedics.   PRIMARY CARE PHYSICIAN: Dr. Ronnald Collum.   ONCOLOGIST: Dr. Oliva Bustard.   REASON FOR CONSULTATION: Preop evaluation; medical management.   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 He is an active person overall and was doing some yard work, vacuuming leaves and tripped over a cord. This was an hour and a half to 2 hours after initiation of yard work. He is usually active and has no anginal symptoms or shortness of breath. At this time had a fall after tripping over the wire, sustaining a right hip fracture. He is being admitted to the orthopedic service, and the hospitalist service was contacted for preoperative evaluation and medical management. He has no chest pains.   He can go a flight of stairs without any significant symptoms shortness of breath or pains in the chest. He has no history of heart attacks or CHF.   PAST MEDICAL HISTORY: Hyperlipidemia, prostate cancer stage IV, seasonal allergies.   PAST SURGICAL HISTORY: Hemorrhoid surgery, history of osteomyelitis when he was  29 months old, and a right ankle surgery.   FAMILY HISTORY: Dad with colon cancer.   SOCIAL HISTORY: Has smoked a half a pack a day for 55 years. Occasional alcohol. No drugs. Is retired.   OUTPATIENT MEDICATIONS: Aspirin 81 mg, 2 tablets once a day, Centrum  Silver 1 tablet  once a day, prednisone 5 mg 2 times a day, simvastatin 20 mg daily, vitamin D2 50,000 international units 2 times a week, and Zytiga 250 mg tablets, take 4 tablets once a day.   REVIEW OF SYSTEMS:  CONSTITUTIONAL: No fever, fatigue, weakness. Has right hip pain.   EYES: No blurry vision or doubled vision.  EARS/NOSE/THROAT: Has some seasonal allergies and sinus issues when his allergies flare up. No tinnitus. Has decreased hearing.  RESPIRATORY: Has a smoker's cough. No wheezing, shortness of breath, dyspnea on exertion or COPD.  CARDIOVASCULAR: No chest pain. No hypertension. No dyspnea on exertion or palpitations.  GASTROINTESTINAL: No nausea, vomiting, diarrhea, abdominal pain, melena or dark stools.  GENITOURINARY: Denies dysuria, hematuria.  HEMATOLOGIC/LYMPHATIC: He denies anemia or easy bruising.  SKIN: Denies any rashes.  MUSCULOSKELETAL: Has right hip pain.  NEUROLOGIC: Denies weakness focally, stroke or TIA. PSYCHIATRIC: Denies anxiety or insomnia.   PHYSICAL EXAMINATION: VITAL SIGNS: Temperature on arrival 98.9, pulse rate 92, respiratory rate 20, blood pressure 161/76, O2 sat 99% on room air.  GENERAL: The patient is a well-developed Caucasian male laying in bed in mild distress, somewhat irritated-looking.  HEENT: Normocephalic, atraumatic. Pupils are equal and reactive. Anicteric sclerae. Extraocular muscles intact. Moist mucous membranes.  NECK: Supple. No thyroid tenderness. No cervical lymphadenopathy.  CARDIOVASCULAR: S1 and S2, regular rate and rhythm. A 3/6 systolic murmur in the aortic area.  LUNGS: Good air entry. No wheezing, or crackles or rhonchi.  ABDOMEN: Soft, nontender, nondistended. Positive bowel sounds in all quadrants.  EXTREMITIES: No significant lower extremity edema. The patient has significant tenderness on moving the right hip, which appears to be somewhat shorter than the left in terms of lower extremity.  SKIN: No obvious rashes.   NEUROLOGIC: Cranial nerves II-XII appear to be grossly intact. Strength is 5/5  in all extremities. Sensation is intact to light touch.  PSYCHIATRIC: Awake, alert, oriented x 3.   LABS: Pelvis x-ray: No acute bony abnormalities of the pelvis.   Chest x-ray: 1-view finding of  low-grade CHF superimposed upon COPD. No focal pneumonia. There are degenerative changes of both shoulders.   Right hip, complete, showing acute intra-trochanteric fracture of the right hip.   Glucose 135, BUN 13, creatinine 0.88, sodium 140, potassium 4.2.   LFTs within normal limits.   WBC 8.1, hemoglobin 12, hematocrit 35, which is around his baseline. Platelets 245. UA: 1+ leukocyte esterase, trace bacteria, 8 WBCs.  EKG showing normal sinus rhythm, rate 67, no acute ST elevations or depressions.   ASSESSMENT AND PLAN: We have a pleasant 79 year old male with stage IV prostate cancer with metastases to bone, on Zytiga and prednisone, was usually active without any symptoms of shortness of breath or chest pains, who presents with a right hip fracture after a fall. He will undergo right hip surgery per orthopedics, and medicine was consulted for preoperative evaluation. He has no chest pains. He has at least 4 metabolic equivalents and has no congestive heart failure or anginal symptoms. He has no acute EKG changes on EKG as well.   Would recommend proceeding to surgery without further cardiac workup, but given the aortic murmur which likely is stenosis, recommend doing an outpatient echocardiogram. However it appears he is not symptomatic from it, and family was not aware of a previous murmur.   Would also recommend outpatient pulmonary function tests and smoking cessation, which was discussed with him at length.   He is a low-risk patient going for a low- to moderate-risk surgery. Given long-term smoking history will add nebulizers p.r.n. if he needs it. Would start IV hydrocortisone as the patient is on prednisone twice daily to help prevent adrenal crisis, and the prednisone could be started in a day or so. Aspirin can be held today, and we did could start simvastatin tomorrow as he has taken a dose already, as well as his prostate cancer medication.   I would recommend DVT prophylaxis once  he is at acceptable bleeding risk per orthopedics. His pain is somewhat controlled, and he is on Dilaudid 1 mg q.4 h. p.r.n., and we will see how that treats his pain.   Total time spent: Fifty-five minutes.   ____________________________ Vivien Presto, MD sa:dm D: 09/01/2012 15:21:34 ET T: 09/01/2012 15:39:20 ET JOB#: [id number]  cc: Vivien Presto, MD, <Dictator> Lenard Simmer, MD Vivien Presto MD ELECTRONICALLY SIGNED 09/29/2012 15:15

## 2014-08-27 NOTE — Op Note (Signed)
PATIENT NAME:  Preston Wilson, Preston Wilson MR#:  [id number] DATE OF BIRTH:  1936/05/04  DATE OF PROCEDURE:  09/01/2012  PREOPERATIVE DIAGNOSIS: Intertrochanteric fracture, right hip.   POSTOPERATIVE DIAGNOSIS: Intertrochanteric fracture, right hip.  PROCEDURE PERFORMED: Open reduction internal fixation of right hip with a Synthes 140 DHS compression plate and screws ([id number] 4-hole plate, 105 mm lag screw, 4 cortical screws).   SURGEON: Park Breed, MD   ANESTHESIA: Spinal.   COMPLICATIONS: None.   DRAINS: Two Hemovacs.   ESTIMATED BLOOD LOSS: 200 mL.   REPLACEMENT: None.   DESCRIPTION OF PROCEDURE: The patient was brought to the operating room where he underwent satisfactory spinal anesthesia and was placed on the fracture table. The left leg was flexed and abducted, and the right leg was placed in traction and internally rotated. The fracture was anatomically reduced. The hip was prepped and draped in sterile fashion and a longitudinal incision made laterally. Dissection was carried out sharply through subcutaneous tissue and fascia. The vastus lateralis  muscle was elevated up off of the femur from posteriorly and a 1/4-inch drill hole made in the lateral cortex. A guide pin was inserted using a 140-degree guide into excellent position on AP and lateral views. This was advanced into the head and neck. A step cut reamer was then used to enlarge the opening in the cortex and create a tract in the femoral neck. A 105 mm lag screw with a 140-degree 4-hole plate was then inserted, and the plate was fixed to the shaft with 4 cortical screws. The traction on this was released. There was little or no settling. Final fluoroscopy showed all hardware to be in good position. The wound was irrigated and the deep fascia closed with 0 Vicryl over a Hemovac drain. Subcutaneous tissue was closed with 2-0 Vicryl over another Hemovac, and the skin was closed with staples. Dry sterile dressing was applied, and the  Hemovac was activated. The patient was transferred to his hospital bed and taken to recovery in good condition. He had good motion of the hip without crepitus.   ____________________________ Park Breed, MD hem:cb D: 09/01/2012 21:30:37 ET T: 09/01/2012 23:14:13 ET JOB#: [id number]  cc: Park Breed, MD, <Dictator> Park Breed MD ELECTRONICALLY SIGNED 09/02/2012 12:52

## 2014-08-27 NOTE — H&P (Signed)
 Subjective/Chief Complaint Pain right hip   History of Present Illness 79 year old male got his feet tangled in a cord this AM and fell on the right hip.  Brought to Emergency Room where exam and X-rays show a minimally displaced right intertrochanteric hip fracture.  Admitted for surgery following medical clearance which has been done.  He has known metastatic prostate carcinoma and is treated at [facility].  On prednisone and other  meds.  Discussed surgery with patient and wife who wish to proceed as soon as possible.  Risks and benefits of surgery were discussed at length including but not limited to infection, non union, nerve or blood vessed damage, non union, need for repeat surgery, blood clots and lung emboli, and death.   Past Med/Surgical Hx:  Hypocalcemia:   Osteomyelitis:   Prostate Cancer:   Hemorrhoidectomy:   Tonsillectomy:   ALLERGIES:  No Known Allergies:   HOME MEDICATIONS: Medication Instructions Status  Vitamin D2 50,000 intl units oral capsule 1 cap(s) orally 2 times a week Active  simvastatin 20 mg oral tablet 1 tab(s) orally once a day  Active  Centrum Silver oral tablet 1 tab(s) orally once a day Active  predniSONE 5 mg oral tablet 1 tab(s) orally 2 times a day Active  Zytiga 250 mg oral tablet 4 tab(s) orally once a day Active  aspirin 81 mg oral delayed release tablet 2 tab(s) orally once a day Active   Family and Social History:  Family History Non-Contributory   Social History negative tobacco   Place of Living Home   Review of Systems:  Fever/Chills No   Cough No   Sputum No   Abdominal Pain No   Physical Exam:  GEN well developed, well nourished   HEENT pink conjunctivae   NECK supple   RESP normal resp effort   CARD regular rate   ABD denies tenderness   GU foley catheter in place   LYMPH negative neck   EXTR Severe pain with range of motion.  circulation/sensation/motor function good and skin intact.   Externally rotated.   SKIN normal to palpation   NEURO motor/sensory function intact   PSYCH alert, A+O to time, place, person, good insight   Lab Results: Hepatic:  28-Apr-14 13:03   Bilirubin, Total 0.8  Alkaline Phosphatase 90  SGPT (ALT) 25  SGOT (AST) 21  Total Protein, Serum 6.8  Albumin, Serum 3.6  Routine BB:  28-Apr-14 13:03   ABO Group + Rh Type O Positive  Antibody Screen NEGATIVE (Result(s) reported on 01 Sep 2012 at 02:31PM.)  Routine Chem:  28-Apr-14 13:03   Glucose, Serum  135  BUN 13  Creatinine (comp) 0.88  Sodium, Serum 140  Potassium, Serum 4.2  Chloride, Serum 106  CO2, Serum 25  Calcium (Total), Serum 9.0  Osmolality (calc) 282  eGFR (African American) >60  eGFR (Non-African American) >60 (eGFR values <60mL/min/1.73 m2 may be an indication of chronic kidney disease (CKD). Calculated eGFR is useful in patients with stable renal function. The eGFR calculation will not be reliable in acutely ill patients when serum creatinine is changing rapidly. It is not useful in  patients on dialysis. The eGFR calculation may not be applicable to patients at the low and high extremes of body sizes, pregnant women, and vegetarians.)  Anion Gap 9  Routine UA:  28-Apr-14 13:03   Color (UA) Straw  Clarity (UA) Cloudy  Glucose (UA) Negative  Bilirubin (UA) Negative  Ketones (UA) 1+    Specific Gravity (UA) 1.015  Blood (UA) Negative  pH (UA) 8.0  Protein (UA) Negative  Nitrite (UA) Negative  Leukocyte Esterase (UA) 1+ (Result(s) reported on 01 Sep 2012 at 02:57PM.)  RBC (UA) 3 /HPF  WBC (UA) 8 /HPF  Bacteria (UA) TRACE  Epithelial Cells (UA) <1 /HPF  Mucous (UA) PRESENT (Result(s) reported on 01 Sep 2012 at 02:57PM.)  Routine Hem:  28-Apr-14 13:03   WBC (CBC) 8.1  RBC (CBC)  3.84  Hemoglobin (CBC)  12.0  Hematocrit (CBC)  35.0  Platelet Count (CBC) 245 (Result(s) reported on 01 Sep 2012 at 01:29PM.)  MCV 91  MCH 31.2  MCHC 34.3  RDW 13.3    Radiology Results: XRay:    28-Apr-14 12:47, Hip Right Complete  Hip Right Complete  REASON FOR EXAM:    fall, right hip pain  COMMENTS:       PROCEDURE: DXR - DXR HIP RIGHT COMPLETE  - Sep 01 2012 12:47PM     RESULT: AP and lateral views of the right hip reveal the patient to have   sustained an acute intertrochanteric fracture. Thefemoral head and neck   are grossly intact. The observed portions of the right hemipelvis appear   normal.    IMPRESSION:  The patient has sustained an acute intertrochanteric   fracture of the right hip.     Dictation Site: 2    Verified By: DAVID A. JORDAN, M.D., MD    28-Apr-14 12:47, Pelvis AP Only  Pelvis AP Only  REASON FOR EXAM:    preop  COMMENTS:       PROCEDURE: DXR - DXR PELVIS AP ONLY  - Sep 01 2012 12:47PM     RESULT: The bony pelvis is osteopenic. There is lucency that projects   over the intertrochanteric region of the right hip consistent with an   intertrochanteric fracture. No acute pelvic fracture is demonstrated. The   left hip appears intact. The observed portions of the sacrum exhibit no   acute abnormalities.    IMPRESSION:  There is no acute bony abnormality of the pelvis. There is   likely an intertrochanteric fracture of the right hip.     Dictation Site: 2    Verified By: DAVID A. JORDAN, M.D., MD  LabUnknown:    28-Apr-14 12:47, Hip Right Complete  PACS Image    28-Apr-14 12:47, Pelvis AP Only  PACS Image    Assessment/Admission Diagnosis Right intertrochanteric hip fracture   Plan Right compression hip nailing   Electronic Signatures: Miller, Howard E (MD)  (Signed 28-Apr-14 17:48)  Authored: CHIEF COMPLAINT and HISTORY, PAST MEDICAL/SURGIAL HISTORY, ALLERGIES, HOME MEDICATIONS, FAMILY AND SOCIAL HISTORY, REVIEW OF SYSTEMS, PHYSICAL EXAM, LABS, Radiology, ASSESSMENT AND PLAN   Last Updated: 28-Apr-14 17:48 by Miller, Howard E (MD) 

## 2014-08-27 NOTE — Consult Note (Signed)
Brief Consult Note: Diagnosis: right hip fx. pre op eval.   Patient was seen by consultant.   Consult note dictated.   Recommend to proceed with surgery or procedure.   Recommend further assessment or treatment.   Orders entered.   Discussed with Attending MD.   Comments: 79 yo with prostate ca on zytiga/prednisone stage 4, seed dr Oliva Bustard.  active person. doing yard work, tripped over cord.  sustaining right hip fx.  active and mets>4 and no sig ekg changes. likley aortic stenosis but not symptomatic from it.  recommend proceed to or w/t further cardiac workup given urgent surgery and pt not symptomatic.  op echo and pfts. d/w him about smoking cessation for 3 min.   start iv hydrocortisone as pt on prednisone (can be resume in a day or so).  long time smoker but no wheezy.  will add prn nebs.  AC once he is at acceptable bleeding risk per ortho.  Electronic Signatures: Vivien Presto (MD)  (Signed 28-Apr-14 15:11)  Authored: Brief Consult Note   Last Updated: 28-Apr-14 15:11 by Vivien Presto (MD)

## 2014-08-27 NOTE — Discharge Summary (Signed)
PATIENT NAME:  Preston Wilson, Preston Wilson MR#:  [id number] DATE OF BIRTH:  Nov 10, 1935  DATE OF ADMISSION:  09/01/2012  DATE OF DISCHARGE:  09/04/2012    FINAL DIAGNOSES: 1.  Minimally displaced intertrochanteric fracture of right hip.  2.  History of metastatic prostate cancer.  3.  Chronic prednisone use.  4.  Hypocalcemia.  5.  History of osteomyelitis.  6.  Hyperlipidemia.   OPERATION: On 09/01/2012, open reduction and internal fixation of the right hip with a Synthes DHS compression plate and screw.   COMPLICATIONS:  None.   CONSULTATION:  [facility].  HOME AND DISCHARGE MEDICATIONS:  Prednisone 5 mg b.i.d., simvastatin 20 mg daily, vitamin D2, 50,000 units twice a week, Zytiga 250 mg 4 tablets daily, enteric-coated aspirin 1 p.o. b.i.d., Norco 5/325 p.r.n. pain.   HISTORY OF PRESENT ILLNESS:  The patient is a 79 year old male under treatment for metastatic prostate cancer. He got tangled up in a hose out in the yard on the day of admission and tripped over a cord. He fell and injured the right hip. He was brought to the Emergency Room where exam and x-rays revealed a minimally displaced intertrochanteric fracture of the hip. He was seen by the [facility] service and cleared medically for surgery. He was having severe pain and requested surgery as soon as possible. The risks and benefits were discussed with the patient and his wife, who elected to proceed with surgery that night.   PAST MEDICAL HISTORY AND ILLNESSES:  As above.   ALLERGIES: None.   REVIEW OF SYSTEMS:  Unremarkable.   PAST SURGICAL HISTORY:  Hemorrhoid surgery, osteomyelitis when he was young  , and right ankle surgery.     FAMILY HISTORY:  Positive for colon cancer in his father.   SOCIAL HISTORY:  He smoked 1/2 pack a day for 55 years. Occasional alcohol. The patient is retired, lives with his wife at home.   PHYSICAL EXAMINATION:  This is healthy-appearing male in moderate distress. The right leg was rotated but not  shortened. There was severe pain with movement. Neurovascular status was intact. The skin was intact. The left hip and leg were normal. The pelvis was normal otherwise.   LABORATORY DATA:  On admission was satisfactory.   HOSPITAL COURSE:  The patient was cleared for surgery and underwent right compression hip nailing on the evening of admission. Postoperatively, he did quite well. Hemoglobin dropped to 9.1 and was 9.7 on the day of discharge. He made good progress with PT, but had minimal pain. His wife recently had surgery on her arm last week, and she cannot help to lift her or assist him. It is felt that he would benefit from skilled nursing rehab for a short time until he can be more independent. He is discharged today to be seen in my office in 2 weeks. His rehabilitation potential is good.    ____________________________ Park Breed, MD hem:dmm D: 09/04/2012 09:41:00 ET T: 09/04/2012 09:51:49 ET JOB#: [id number]  cc: Lenard Simmer, MD Martie Lee. Oliva Bustard, MD Park Breed, MD, <Dictator> Park Breed MD ELECTRONICALLY SIGNED 09/05/2012 12:25

## 2014-09-17 ENCOUNTER — Other Ambulatory Visit: Payer: Self-pay | Admitting: *Deleted

## 2014-09-17 DIAGNOSIS — C61 Malignant neoplasm of prostate: Secondary | ICD-10-CM

## 2014-09-22 ENCOUNTER — Inpatient Hospital Stay: Payer: Medicare Other | Attending: Oncology | Admitting: Oncology

## 2014-09-22 ENCOUNTER — Encounter (INDEPENDENT_AMBULATORY_CARE_PROVIDER_SITE_OTHER): Payer: Self-pay

## 2014-09-22 ENCOUNTER — Inpatient Hospital Stay: Payer: Medicare Other

## 2014-09-22 ENCOUNTER — Encounter: Payer: Self-pay | Admitting: Oncology

## 2014-09-22 VITALS — BP 135/77 | HR 63 | Temp 96.3°F | Ht 72.0 in | Wt 170.6 lb

## 2014-09-22 DIAGNOSIS — C7951 Secondary malignant neoplasm of bone: Secondary | ICD-10-CM

## 2014-09-22 DIAGNOSIS — R918 Other nonspecific abnormal finding of lung field: Secondary | ICD-10-CM | POA: Insufficient documentation

## 2014-09-22 DIAGNOSIS — Z79899 Other long term (current) drug therapy: Secondary | ICD-10-CM | POA: Insufficient documentation

## 2014-09-22 DIAGNOSIS — Z923 Personal history of irradiation: Secondary | ICD-10-CM | POA: Insufficient documentation

## 2014-09-22 DIAGNOSIS — C61 Malignant neoplasm of prostate: Secondary | ICD-10-CM

## 2014-09-22 DIAGNOSIS — F17210 Nicotine dependence, cigarettes, uncomplicated: Secondary | ICD-10-CM | POA: Diagnosis not present

## 2014-09-22 DIAGNOSIS — Z79818 Long term (current) use of other agents affecting estrogen receptors and estrogen levels: Secondary | ICD-10-CM | POA: Insufficient documentation

## 2014-09-22 LAB — CBC WITH DIFFERENTIAL/PLATELET
BASOS ABS: 0.1 10*3/uL (ref 0–0.1)
Basophils Relative: 1 %
Eosinophils Absolute: 0.1 10*3/uL (ref 0–0.7)
Eosinophils Relative: 2 %
HCT: 35.5 % — ABNORMAL LOW (ref 40.0–52.0)
HEMOGLOBIN: 11.9 g/dL — AB (ref 13.0–18.0)
Lymphocytes Relative: 35 %
Lymphs Abs: 1.8 10*3/uL (ref 1.0–3.6)
MCH: 30.7 pg (ref 26.0–34.0)
MCHC: 33.5 g/dL (ref 32.0–36.0)
MCV: 91.9 fL (ref 80.0–100.0)
MONO ABS: 0.4 10*3/uL (ref 0.2–1.0)
Monocytes Relative: 8 %
Neutro Abs: 2.8 10*3/uL (ref 1.4–6.5)
Neutrophils Relative %: 54 %
Platelets: 215 10*3/uL (ref 150–440)
RBC: 3.87 MIL/uL — ABNORMAL LOW (ref 4.40–5.90)
RDW: 13.6 % (ref 11.5–14.5)
WBC: 5.2 10*3/uL (ref 3.8–10.6)

## 2014-09-22 LAB — COMPREHENSIVE METABOLIC PANEL
ALT: 14 U/L — AB (ref 17–63)
AST: 18 U/L (ref 15–41)
Albumin: 4 g/dL (ref 3.5–5.0)
Alkaline Phosphatase: 74 U/L (ref 38–126)
Anion gap: 6 (ref 5–15)
BUN: 14 mg/dL (ref 6–20)
CO2: 24 mmol/L (ref 22–32)
CREATININE: 0.6 mg/dL — AB (ref 0.61–1.24)
Calcium: 8.6 mg/dL — ABNORMAL LOW (ref 8.9–10.3)
Chloride: 104 mmol/L (ref 101–111)
GFR calc Af Amer: >60 mL/min (ref >60)
Glucose, Bld: 124 mg/dL — ABNORMAL HIGH (ref 65–99)
Potassium: 3.7 mmol/L (ref 3.5–5.1)
Sodium: 134 mmol/L — ABNORMAL LOW (ref 135–145)
Total Bilirubin: 0.5 mg/dL (ref 0.3–1.2)
Total Protein: 6.7 g/dL (ref 6.5–8.1)

## 2014-09-22 LAB — PSA: PSA: 1.99 ng/mL (ref 0.00–4.00)

## 2014-09-22 NOTE — Progress Notes (Signed)
[place] @ [facility] Telephone:(336) [phone]  Fax:(336) [place] OB: 1935/09/20  MR#: [id number]  VOZ#:[id number]  Patient Care Team: Lenard Simmer, MD as PCP - General (Endocrinology)  CHIEF COMPLAINT:  Chief Complaint  Patient presents with  . Follow-up    Oncology History   1. Carcinoma of prostate diagnosis in 2006.  Had received radiation therapy, external beam to prostate and pelvic lymph nodes.  Had Gleason 9 (4+5.)  PainBaseline PSA was 13. 2. Received Lupron injection after her radiation therapy also.  PSA was going up.  Last year or so received Lupron injection in September but did not have any followup.   3. Recently (this August, 2012) PSA was found to be 17. Patient was started on Lupron from September of 2012. 4. Patient has progressive disease by PSA criteria (October, 2013) 5. Patient was started on Zytiga and prednisone in November of 2013. 6.traumatic hip fracture ((right femur) status post internally fixed ,no evidence of metastatic disease(May of 2014) 7.bone scan in July of 2014 at outside institutions shows metastases to left scapula and left 11th rib/.. 8.progressive On ZYTIGA by PSA criteria.  Will start patient on Xtandi, September of2015     Prostate cancer   09/22/2014 Initial Diagnosis Prostate cancer    No flowsheet data found.  INTERVAL HISTORY: 79 year old gentleman was a chronic smoker and is stage IV carcinoma prostate presently onXTANDI and on Lupron.  Patient's appetite is getting better.  Patient continues to smoke.  No difficulty passing urine.  Tolerating treatment very well.  Here for further follow-up and treatment consideration   REVIEW OF SYSTEMS:   GENERAL:  Feels good.  Active.  No fevers, sweats or weight loss. PERFORMANCE STATUS (ECOG):  01 HEENT:  No visual changes, runny nose, sore throat, mouth sores or tenderness. Lungs: No shortness of breath or cough.  No hemoptysis. Cardiac:  No chest pain,  palpitations, orthopnea, or PND. GI:  No nausea, vomiting, diarrhea, constipation, melena or hematochezia. GU:  No urgency, frequency, dysuria, or hematuria. Musculoskeletal:  No back pain.  No joint pain.  No muscle tenderness. Extremities:  No pain or swelling. Skin:  No rashes or skin changes. Neuro:  No headache, numbness or weakness, balance or coordination issues. Endocrine:  No diabetes, thyroid issues, hot flashes or night sweats. Psych:  No mood changes, depression or anxiety. Pain:  No focal pain. Review of systems:  All other systems reviewed and found to be negative. As per HPI. Otherwise, a complete review of systems is negatve.  PAST MEDICAL HISTORY: Past Medical History  Diagnosis Date  . Prostate cancer   . Osteomyelitis   . Hypocalcemia     PAST SURGICAL HISTORY: Past Surgical History  Procedure Laterality Date  . Excisional hemorrhoidectomy    . Tonsillectomy      FAMILY HISTORY  No Known Allergies:   Significant History/PMH:   Hypocalcemia:    Osteomyelitis:    Prostate Cancer:    Right Pinning of Intertrochanteric Femur Fracture  Right: 01-Sep-2012   Hemorrhoidectomy:    Tonsillectomy:   Preventive Screening:  Has patient had any of the following test? Colonscopy  Prostate Exam   Last Colonoscopy: 2011   Last Prostate Exam: August 2012   Smoking History: Smoking History 0.5 Packs per day. Smoking Cessation Information Given to Patient .  PFSH: Family History: No family history of colorectal cancer, breast cancer, or ovarian cancer.  Comments: Patient does not smoke does not drink  Additional Past Medical and Surgical History: Hyper cholesteremia  No other significant past medical history        ADVANCED DIRECTIVES:    HEALTH MAINTENANCE: History  Substance Use Topics  . Smoking status: Not on file  . Smokeless tobacco: Not on file  . Alcohol Use: Not on file     Colonoscopy:  PAP:  Bone density:  Lipid panel:  No  Known Allergies  Current Outpatient Prescriptions  Medication Sig Dispense Refill  . calcium-vitamin D (OSCAL WITH D) 500-200 MG-UNIT per tablet Take 1 tablet by mouth.    . Multiple Vitamin (MULTIVITAMIN) tablet Take 1 tablet by mouth daily.    . Vitamin D, Ergocalciferol, (DRISDOL) 50000 UNITS CAPS capsule Take 50,000 Units by mouth once a week.  0  . XTANDI 40 MG capsule     . calcium-vitamin D 250-100 MG-UNIT per tablet Take 1 tablet by mouth 2 (two) times daily.     No current facility-administered medications for this visit.    OBJECTIVE:  Filed Vitals:   09/22/14 1434  BP: 135/77  Pulse: 63  Temp: 96.3 F (35.7 C)     Body mass index is 23.14 kg/(m^2).    ECOG FS:1 - Symptomatic but completely ambulatory  PHYSICAL EXAM: Gen. status: Slightly apprehensive individual not any acute distress.Head exam was generally normal. There was no scleral icterus or corneal arcus. Mucous membranes were moist.  Lungs: Occasional rhonchi.  No crepitations.  Examination of the chest was unremarkable. There were no bony deformities, no asymmetry, and no other abnormalities.Abdominal exam revealed normal bowel sounds. The abdomen was soft, non-tender, and without masses, organomegaly, or appreciable enlargement of the abdominal aorta.Neurologically, the patient was awake, alert, and oriented to person, place and time. There were no obvious focal neurologic abnormalities.Examination of the skin revealed no evidence of significant rashes, suspicious appearing nevi or other concerning lesions.  Lower extremity no edema. All other systems were examined and they were normal   LAB RESULTS:  Appointment on 09/22/2014  Component Date Value Ref Range Status  . WBC 09/22/2014 5.2  3.8 - 10.6 K/uL Final  . RBC 09/22/2014 3.87* 4.40 - 5.90 MIL/uL Final  . Hemoglobin 09/22/2014 11.9* 13.0 - 18.0 g/dL Final  . HCT 09/22/2014 35.5* 40.0 - 52.0 % Final  . MCV 09/22/2014 91.9  80.0 - 100.0 fL Final  . MCH  09/22/2014 30.7  26.0 - 34.0 pg Final  . MCHC 09/22/2014 33.5  32.0 - 36.0 g/dL Final  . RDW 09/22/2014 13.6  11.5 - 14.5 % Final  . Platelets 09/22/2014 215  150 - 440 K/uL Final  . Neutrophils Relative % 09/22/2014 54   Final  . Neutro Abs 09/22/2014 2.8  1.4 - 6.5 K/uL Final  . Lymphocytes Relative 09/22/2014 35   Final  . Lymphs Abs 09/22/2014 1.8  1.0 - 3.6 K/uL Final  . Monocytes Relative 09/22/2014 8   Final  . Monocytes Absolute 09/22/2014 0.4  0.2 - 1.0 K/uL Final  . Eosinophils Relative 09/22/2014 2   Final  . Eosinophils Absolute 09/22/2014 0.1  0 - 0.7 K/uL Final  . Basophils Relative 09/22/2014 1   Final  . Basophils Absolute 09/22/2014 0.1  0 - 0.1 K/uL Final  . Sodium 09/22/2014 134* 135 - 145 mmol/L Final  . Potassium 09/22/2014 3.7  3.5 - 5.1 mmol/L Final  . Chloride 09/22/2014 104  101 - 111 mmol/L Final  . CO2 09/22/2014 24  22 - 32 mmol/L Final  .   Glucose, Bld 09/22/2014 124* 65 - 99 mg/dL Final  . BUN 09/22/2014 14  6 - 20 mg/dL Final  . Creatinine, Ser 09/22/2014 0.60* 0.61 - 1.24 mg/dL Final  . Calcium 09/22/2014 8.6* 8.9 - 10.3 mg/dL Final  . Total Protein 09/22/2014 6.7  6.5 - 8.1 g/dL Final  . Albumin 09/22/2014 4.0  3.5 - 5.0 g/dL Final  . AST 09/22/2014 18  15 - 41 U/L Final  . ALT 09/22/2014 14* 17 - 63 U/L Final  . Alkaline Phosphatase 09/22/2014 74  38 - 126 U/L Final  . Total Bilirubin 09/22/2014 0.5  0.3 - 1.2 mg/dL Final  . GFR calc non Af Amer 09/22/2014 >60  >60 mL/min Final  . GFR calc Af Amer 09/22/2014 >60  >60 mL/min Final   Comment: (NOTE) The eGFR has been calculated using the CKD EPI equation. This calculation has not been validated in all clinical situations. eGFR's persistently <60 mL/min signify possible Chronic Kidney Disease.   . Anion gap 09/22/2014 6  5 - 15 Final    No results found for: LABCA2 No results found for: CA199 No results found for: CEA Lab Results  Component Value Date   PSA 2.0 08/25/2014         ASSESSMENT: 79 year old gentleman with stage IV carcinoma prostate.  Presently onXTANDI and Lupron. We are waiting for PSA He PSA is declining then will continue present approach.  If PSA continues to go up possibility of chemotherapy can be considered.  MEDICAL DECISION MAKING:  All lab data has been reviewed.  Patient continues to smoke as an abnormal CT scan of chest in the past and may need to be followed up  Patient expressed understanding and was in agreement with this plan. He also understands that He can call clinic at any time with any questions, concerns, or complaints.    No matching staging information was found for the patient.  Forest Gleason, MD   09/22/2014 2:57 PM

## 2014-09-23 ENCOUNTER — Telehealth: Payer: Self-pay | Admitting: *Deleted

## 2014-09-23 NOTE — Telephone Encounter (Signed)
Message left that PSA was reported at 1.99 and will continue same treatment as planned.

## 2014-10-27 ENCOUNTER — Inpatient Hospital Stay: Payer: Medicare Other | Attending: Oncology | Admitting: Oncology

## 2014-10-27 ENCOUNTER — Encounter (INDEPENDENT_AMBULATORY_CARE_PROVIDER_SITE_OTHER): Payer: Self-pay

## 2014-10-27 ENCOUNTER — Inpatient Hospital Stay: Payer: Medicare Other

## 2014-10-27 VITALS — BP 120/71 | HR 87 | Temp 97.0°F | Wt 170.4 lb

## 2014-10-27 DIAGNOSIS — Z8781 Personal history of (healed) traumatic fracture: Secondary | ICD-10-CM | POA: Insufficient documentation

## 2014-10-27 DIAGNOSIS — E780 Pure hypercholesterolemia: Secondary | ICD-10-CM | POA: Insufficient documentation

## 2014-10-27 DIAGNOSIS — C61 Malignant neoplasm of prostate: Secondary | ICD-10-CM | POA: Insufficient documentation

## 2014-10-27 DIAGNOSIS — Z923 Personal history of irradiation: Secondary | ICD-10-CM

## 2014-10-27 DIAGNOSIS — F17210 Nicotine dependence, cigarettes, uncomplicated: Secondary | ICD-10-CM | POA: Diagnosis not present

## 2014-10-27 LAB — COMPREHENSIVE METABOLIC PANEL
ALBUMIN: 3.9 g/dL (ref 3.5–5.0)
ALT: 14 U/L — AB (ref 17–63)
ANION GAP: 7 (ref 5–15)
AST: 19 U/L (ref 15–41)
Alkaline Phosphatase: 71 U/L (ref 38–126)
BILIRUBIN TOTAL: 0.5 mg/dL (ref 0.3–1.2)
BUN: 18 mg/dL (ref 6–20)
CALCIUM: 8.7 mg/dL — AB (ref 8.9–10.3)
CO2: 25 mmol/L (ref 22–32)
Chloride: 104 mmol/L (ref 101–111)
Creatinine, Ser: 0.77 mg/dL (ref 0.61–1.24)
Glucose, Bld: 100 mg/dL — ABNORMAL HIGH (ref 65–99)
Potassium: 4.2 mmol/L (ref 3.5–5.1)
SODIUM: 136 mmol/L (ref 135–145)
TOTAL PROTEIN: 6.6 g/dL (ref 6.5–8.1)

## 2014-10-27 LAB — CBC WITH DIFFERENTIAL/PLATELET
BASOS ABS: 0 10*3/uL (ref 0–0.1)
Basophils Relative: 1 %
Eosinophils Absolute: 0.1 10*3/uL (ref 0–0.7)
Eosinophils Relative: 2 %
HCT: 35.9 % — ABNORMAL LOW (ref 40.0–52.0)
HEMOGLOBIN: 11.9 g/dL — AB (ref 13.0–18.0)
Lymphocytes Relative: 34 %
Lymphs Abs: 1.7 10*3/uL (ref 1.0–3.6)
MCH: 30.5 pg (ref 26.0–34.0)
MCHC: 33.2 g/dL (ref 32.0–36.0)
MCV: 92 fL (ref 80.0–100.0)
Monocytes Absolute: 0.4 10*3/uL (ref 0.2–1.0)
Monocytes Relative: 9 %
NEUTROS ABS: 2.7 10*3/uL (ref 1.4–6.5)
NEUTROS PCT: 54 %
Platelets: 229 10*3/uL (ref 150–440)
RBC: 3.9 MIL/uL — ABNORMAL LOW (ref 4.40–5.90)
RDW: 13.8 % (ref 11.5–14.5)
WBC: 5 10*3/uL (ref 3.8–10.6)

## 2014-10-27 LAB — PSA: PSA: 1.79 ng/mL (ref 0.00–4.00)

## 2014-10-27 NOTE — Progress Notes (Signed)
Patient does not have living will.  Currently smokes. 

## 2014-10-29 ENCOUNTER — Telehealth: Payer: Self-pay | Admitting: *Deleted

## 2014-10-29 NOTE — Telephone Encounter (Signed)
Called pt to inform them of PSA trending down. PSA is currently at 1.79.

## 2014-11-09 ENCOUNTER — Encounter: Payer: Self-pay | Admitting: Oncology

## 2014-11-09 NOTE — Progress Notes (Signed)
[place] @ [facility] Telephone:(336) [phone]  Fax:(336) [place] OB: 1935/12/21  MR#: [id number]  IZT#:[id number]  Patient Care Team: Lenard Simmer, MD as PCP - General (Endocrinology)  CHIEF COMPLAINT:  Chief Complaint  Patient presents with  . Follow-up    Oncology History   1. Carcinoma of prostate diagnosis in 2006.  Had received radiation therapy, external beam to prostate and pelvic lymph nodes.  Had Gleason 9 (4+5.)  PainBaseline PSA was 13. 2. Received Lupron injection after her radiation therapy also.  PSA was going up.  Last year or so received Lupron injection in September but did not have any followup.   3. Recently (this August, 2012) PSA was found to be 17. Patient was started on Lupron from September of 2012. 4. Patient has progressive disease by PSA criteria (October, 2013) 5. Patient was started on Zytiga and prednisone in November of 2013. 6.traumatic hip fracture ((right femur) status post internally fixed ,no evidence of metastatic disease(May of 2014) 7.bone scan in July of 2014 at outside institutions shows metastases to left scapula and left 11th rib/.. 8.progressive On ZYTIGA by PSA criteria.  Will start patient on Xtandi, September of2015     Prostate cancer   09/22/2014 Initial Diagnosis Prostate cancer    No flowsheet data found.  INTERVAL HISTORY: 79 year old gentleman was a chronic smoker and is stage IV carcinoma prostate presently onXTANDI and on Lupron.  Patient's appetite is getting better.  Patient continues to smoke.  No difficulty passing urine.  Tolerating treatment very well.  Here for further follow-up and treatment consideration He  is tolerating treatment very well.  No rash.  No abdominal pain.  No nausea.  Complains of hot flashes Stage IV carcinoma prostate   REVIEW OF SYSTEMS:   GENERAL:  Feels good.  Active.  No fevers, sweats or weight loss. PERFORMANCE STATUS (ECOG):  01 HEENT:  No visual  changes, runny nose, sore throat, mouth sores or tenderness. Lungs: No shortness of breath or cough.  No hemoptysis. Cardiac:  No chest pain, palpitations, orthopnea, or PND. GI:  No nausea, vomiting, diarrhea, constipation, melena or hematochezia. GU:  No urgency, frequency, dysuria, or hematuria. Musculoskeletal:  No back pain.  No joint pain.  No muscle tenderness. Extremities:  No pain or swelling. Skin:  No rashes or skin changes. Neuro:  No headache, numbness or weakness, balance or coordination issues. Endocrine:  No diabetes, thyroid issues, hot flashes or night sweats. Psych:  No mood changes, depression or anxiety. Pain:  No focal pain. Review of systems:  All other systems reviewed and found to be negative. As per HPI. Otherwise, a complete review of systems is negatve.  PAST MEDICAL HISTORY: Past Medical History  Diagnosis Date  . Prostate cancer   . Osteomyelitis   . Hypocalcemia     PAST SURGICAL HISTORY: Past Surgical History  Procedure Laterality Date  . Excisional hemorrhoidectomy    . Tonsillectomy      FAMILY HISTORY  No Known Allergies:   Significant History/PMH:   Hypocalcemia:    Osteomyelitis:    Prostate Cancer:    Right Pinning of Intertrochanteric Femur Fracture  Right: 01-Sep-2012   Hemorrhoidectomy:    Tonsillectomy:   Preventive Screening:  Has patient had any of the following test? Colonscopy  Prostate Exam   Last Colonoscopy: 2011   Last Prostate Exam: August 2012   Smoking History: Smoking History 0.5 Packs per day. Smoking Cessation Information Given to Patient .  PFSH: Family History: No family history of colorectal cancer, breast cancer, or ovarian cancer.  Comments: Patient does not smoke does not drink  Additional Past Medical and Surgical History: Hyper cholesteremia  No other significant past medical history        ADVANCED DIRECTIVES:    HEALTH MAINTENANCE: History  Substance Use Topics  . Smoking status:  Current Every Day Smoker  . Smokeless tobacco: Not on file  . Alcohol Use: Not on file      No Known Allergies  Current Outpatient Prescriptions  Medication Sig Dispense Refill  . calcium-vitamin D (OSCAL WITH D) 500-200 MG-UNIT per tablet Take 1 tablet by mouth.    . Multiple Vitamin (MULTIVITAMIN) tablet Take 1 tablet by mouth daily.    . Vitamin D, Ergocalciferol, (DRISDOL) 50000 UNITS CAPS capsule Take 50,000 Units by mouth once a week.  0  . XTANDI 40 MG capsule     . calcium-vitamin D 250-100 MG-UNIT per tablet Take 1 tablet by mouth 2 (two) times daily.     No current facility-administered medications for this visit.    OBJECTIVE:  Filed Vitals:   10/27/14 1429  BP: 120/71  Pulse: 87  Temp: 97 F (36.1 C)     Body mass index is 23.11 kg/(m^2).    ECOG FS:1 - Symptomatic but completely ambulatory  PHYSICAL EXAM: GENERAL:  Well developed, well nourished, sitting comfortably in the exam room in no acute distress. MENTAL STATUS:  Alert and oriented to person, place and time. HEAD:   Normocephalic, atraumatic, face symmetric, no Cushingoid features. EYES:  .  Pupils equal round and reactive to light and accomodation.  No conjunctivitis or scleral icterus. ENT:  Oropharynx clear without lesion.  Tongue normal. Mucous membranes moist.  RESPIRATORY:  Clear to auscultation without rales, wheezes or rhonchi. CARDIOVASCULAR:  Regular rate and rhythm without murmur, rub or gallop.  ABDOMEN:  Soft, non-tender, with active bowel sounds, and no hepatosplenomegaly.  No masses. BACK:  No CVA tenderness.  No tenderness on percussion of the back or rib cage. SKIN:  No rashes, ulcers or lesions. EXTREMITIES: No edema, no skin discoloration or tenderness.  No palpable cords. LYMPH NODES: No palpable cervical, supraclavicular, axillary or inguinal adenopathy  NEUROLOGICAL: Unremarkable. PSYCH:  Appropriate.   LAB RESULTS:  Appointment on 10/27/2014  Component Date Value Ref Range  Status  . WBC 10/27/2014 5.0  3.8 - 10.6 K/uL Final  . RBC 10/27/2014 3.90* 4.40 - 5.90 MIL/uL Final  . Hemoglobin 10/27/2014 11.9* 13.0 - 18.0 g/dL Final  . HCT 10/27/2014 35.9* 40.0 - 52.0 % Final  . MCV 10/27/2014 92.0  80.0 - 100.0 fL Final  . MCH 10/27/2014 30.5  26.0 - 34.0 pg Final  . MCHC 10/27/2014 33.2  32.0 - 36.0 g/dL Final  . RDW 10/27/2014 13.8  11.5 - 14.5 % Final  . Platelets 10/27/2014 229  150 - 440 K/uL Final  . Neutrophils Relative % 10/27/2014 54   Final  . Neutro Abs 10/27/2014 2.7  1.4 - 6.5 K/uL Final  . Lymphocytes Relative 10/27/2014 34   Final  . Lymphs Abs 10/27/2014 1.7  1.0 - 3.6 K/uL Final  . Monocytes Relative 10/27/2014 9   Final  . Monocytes Absolute 10/27/2014 0.4  0.2 - 1.0 K/uL Final  . Eosinophils Relative 10/27/2014 2   Final  . Eosinophils Absolute 10/27/2014 0.1  0 - 0.7 K/uL Final  . Basophils Relative 10/27/2014 1   Final  . Basophils Absolute 10/27/2014  0.0  0 - 0.1 K/uL Final  . Sodium 10/27/2014 136  135 - 145 mmol/L Final  . Potassium 10/27/2014 4.2  3.5 - 5.1 mmol/L Final  . Chloride 10/27/2014 104  101 - 111 mmol/L Final  . CO2 10/27/2014 25  22 - 32 mmol/L Final  . Glucose, Bld 10/27/2014 100* 65 - 99 mg/dL Final  . BUN 10/27/2014 18  6 - 20 mg/dL Final  . Creatinine, Ser 10/27/2014 0.77  0.61 - 1.24 mg/dL Final  . Calcium 10/27/2014 8.7* 8.9 - 10.3 mg/dL Final  . Total Protein 10/27/2014 6.6  6.5 - 8.1 g/dL Final  . Albumin 10/27/2014 3.9  3.5 - 5.0 g/dL Final  . AST 10/27/2014 19  15 - 41 U/L Final  . ALT 10/27/2014 14* 17 - 63 U/L Final  . Alkaline Phosphatase 10/27/2014 71  38 - 126 U/L Final  . Total Bilirubin 10/27/2014 0.5  0.3 - 1.2 mg/dL Final  . GFR calc non Af Amer 10/27/2014 >60  >60 mL/min Final  . GFR calc Af Amer 10/27/2014 >60  >60 mL/min Final   Comment: (NOTE) The eGFR has been calculated using the CKD EPI equation. This calculation has not been validated in all clinical situations. eGFR's persistently <60  mL/min signify possible Chronic Kidney Disease.   . Anion gap 10/27/2014 7  5 - 15 Final  . PSA 10/27/2014 1.79  0.00 - 4.00 ng/mL Final   Comment: (NOTE) While PSA levels of <=4.0 ng/ml are reported as reference range, some men with levels below 4.0 ng/ml can have prostate cancer and many men with PSA above 4.0 ng/ml do not have prostate cancer.  Other tests such as free PSA, age specific reference ranges, PSA velocity and PSA doubling time may be helpful especially in men less than 60 years old. Performed at [facility]      Lab Results  Component Value Date   PSA 1.79 10/27/2014        ASSESSMENT: 79 year old gentleman with stage IV carcinoma prostate.  Presently onXTANDI and Lupron. PSA shows downward trend  .  MEDICAL DECISION MAKING:  Continue Lupron and xtandi Patient expressed understanding and was in agreement with this plan. He also understands that He can call clinic at any time with any questions, concerns, or complaints.    No matching staging information was found for the patient.  Forest Gleason, MD   11/09/2014 7:46 PM

## 2014-11-24 ENCOUNTER — Inpatient Hospital Stay: Payer: Medicare Other | Attending: Oncology | Admitting: Oncology

## 2014-11-24 ENCOUNTER — Inpatient Hospital Stay: Payer: Medicare Other

## 2014-11-24 VITALS — BP 127/72 | HR 62 | Temp 97.1°F | Wt 171.3 lb

## 2014-11-24 DIAGNOSIS — Z79899 Other long term (current) drug therapy: Secondary | ICD-10-CM | POA: Insufficient documentation

## 2014-11-24 DIAGNOSIS — C61 Malignant neoplasm of prostate: Secondary | ICD-10-CM

## 2014-11-24 DIAGNOSIS — Z923 Personal history of irradiation: Secondary | ICD-10-CM | POA: Diagnosis not present

## 2014-11-24 DIAGNOSIS — C7951 Secondary malignant neoplasm of bone: Secondary | ICD-10-CM | POA: Diagnosis not present

## 2014-11-24 DIAGNOSIS — Z8781 Personal history of (healed) traumatic fracture: Secondary | ICD-10-CM | POA: Insufficient documentation

## 2014-11-24 DIAGNOSIS — Z79818 Long term (current) use of other agents affecting estrogen receptors and estrogen levels: Secondary | ICD-10-CM | POA: Diagnosis not present

## 2014-11-24 DIAGNOSIS — E785 Hyperlipidemia, unspecified: Secondary | ICD-10-CM | POA: Diagnosis not present

## 2014-11-24 DIAGNOSIS — R918 Other nonspecific abnormal finding of lung field: Secondary | ICD-10-CM | POA: Insufficient documentation

## 2014-11-24 DIAGNOSIS — F17210 Nicotine dependence, cigarettes, uncomplicated: Secondary | ICD-10-CM

## 2014-11-24 LAB — COMPREHENSIVE METABOLIC PANEL
ALBUMIN: 4.2 g/dL (ref 3.5–5.0)
ALK PHOS: 78 U/L (ref 38–126)
ALT: 15 U/L — AB (ref 17–63)
ANION GAP: 7 (ref 5–15)
AST: 20 U/L (ref 15–41)
BUN: 13 mg/dL (ref 6–20)
CALCIUM: 8.6 mg/dL — AB (ref 8.9–10.3)
CHLORIDE: 104 mmol/L (ref 101–111)
CO2: 24 mmol/L (ref 22–32)
Creatinine, Ser: 0.58 mg/dL — ABNORMAL LOW (ref 0.61–1.24)
GFR calc non Af Amer: >60 mL/min (ref >60)
GLUCOSE: 132 mg/dL — AB (ref 65–99)
POTASSIUM: 3.8 mmol/L (ref 3.5–5.1)
Sodium: 135 mmol/L (ref 135–145)
TOTAL PROTEIN: 6.8 g/dL (ref 6.5–8.1)
Total Bilirubin: 0.5 mg/dL (ref 0.3–1.2)

## 2014-11-24 LAB — PSA: PSA: 2.12 ng/mL (ref 0.00–4.00)

## 2014-11-24 LAB — CBC WITH DIFFERENTIAL/PLATELET
Basophils Absolute: 0.1 10*3/uL (ref 0–0.1)
Basophils Relative: 1 %
Eosinophils Absolute: 0.1 10*3/uL (ref 0–0.7)
Eosinophils Relative: 3 %
HCT: 36.2 % — ABNORMAL LOW (ref 40.0–52.0)
HEMOGLOBIN: 12 g/dL — AB (ref 13.0–18.0)
Lymphocytes Relative: 36 %
Lymphs Abs: 1.9 10*3/uL (ref 1.0–3.6)
MCH: 30.8 pg (ref 26.0–34.0)
MCHC: 33.2 g/dL (ref 32.0–36.0)
MCV: 92.9 fL (ref 80.0–100.0)
MONOS PCT: 8 %
Monocytes Absolute: 0.4 10*3/uL (ref 0.2–1.0)
Neutro Abs: 2.8 10*3/uL (ref 1.4–6.5)
Neutrophils Relative %: 52 %
Platelets: 237 10*3/uL (ref 150–440)
RBC: 3.89 MIL/uL — ABNORMAL LOW (ref 4.40–5.90)
RDW: 13.8 % (ref 11.5–14.5)
WBC: 5.3 10*3/uL (ref 3.8–10.6)

## 2014-11-24 MED ORDER — LEUPROLIDE ACETATE (3 MONTH) 22.5 MG IM KIT
22.5000 mg | PACK | Freq: Once | INTRAMUSCULAR | Status: AC
  Administered 2014-11-24: 22.5 mg via INTRAMUSCULAR
  Filled 2014-11-24: qty 22.5

## 2014-11-24 NOTE — Progress Notes (Signed)
Patient does not have living will.  Currently smokes. 

## 2014-11-25 ENCOUNTER — Telehealth: Payer: Self-pay | Admitting: *Deleted

## 2014-11-25 ENCOUNTER — Encounter: Payer: Self-pay | Admitting: Oncology

## 2014-11-25 NOTE — Progress Notes (Signed)
[place] @ [facility] [phone]  Fax:(336) [place] OB: 04-Jul-1935  MR#: [id number]  PPI#:[id number]  Patient Care Team: Lenard Simmer, MD as PCP - General (Endocrinology)  CHIEF COMPLAINT:  Chief Complaint  Patient presents with  . Follow-up    Oncology History   1. Carcinoma of prostate diagnosis in 2006.  Had received radiation therapy, external beam to prostate and pelvic lymph nodes.  Had Gleason 9 (4+5.)  PainBaseline PSA was 13. 2. Received Lupron injection after her radiation therapy also.  PSA was going up.  Last year or so received Lupron injection in September but did not have any followup.   3. Recently (this August, 2012) PSA was found to be 17. Patient was started on Lupron from September of 2012. 4. Patient has progressive disease by PSA criteria (October, 2013) 5. Patient was started on Zytiga and prednisone in November of 2013. 6.traumatic hip fracture ((right femur) status post internally fixed ,no evidence of metastatic disease(May of 2014) 7.bone scan in July of 2014 at outside institutions shows metastases to left scapula and left 11th rib/.. 8.progressive On ZYTIGA by PSA criteria.  Will start patient on Xtandi, September of2015     Prostate cancer   09/22/2014 Initial Diagnosis Prostate cancer    Oncology Flowsheet 11/24/2014  leuprolide (LUPRON) IM 22.5 mg    INTERVAL HISTORY: 79 year old gentleman was a chronic smoker and is stage IV carcinoma prostate presently onXTANDI and on Lupron.  Patient's appetite is getting better.  Patient continues to smoke.  No difficulty passing urine.  Tolerating treatment very well.  Here for further follow-up and treatment consideration He  is tolerating treatment very well.  No rash.  No abdominal pain.  No nausea.  Complains of hot flashes Stage IV carcinoma prostate July, 2016 Patient is here for further follow-up regarding carcinoma prostate.  Stage IV disease.  Patient  has been evaluated by primary care physician.  PSA was slightly elevated 2.3.  Patient has a skin lesion removed which was negative for any malignancy.  Does not have any aches and pains.  Patient continues to smoke. The records from primary care physician has been available for review patient is quite concerned about high PSA TAKING XTANDI a regular basis   REVIEW OF SYSTEMS:   GENERAL:  Feels good.  Active.  No fevers, sweats or weight loss. PERFORMANCE STATUS (ECOG):  01 HEENT:  No visual changes, runny nose, sore throat, mouth sores or tenderness. Lungs: No shortness of breath or cough.  No hemoptysis. Cardiac:  No chest pain, palpitations, orthopnea, or PND. GI:  No nausea, vomiting, diarrhea, constipation, melena or hematochezia. GU:  No urgency, frequency, dysuria, or hematuria. Musculoskeletal:  No back pain.  No joint pain.  No muscle tenderness. Extremities:  No pain or swelling. Skin:  No rashes or skin changes. Neuro:  No headache, numbness or weakness, balance or coordination issues. Endocrine:  No diabetes, thyroid issues, hot flashes or night sweats. Psych:  No mood changes, depression or anxiety. Pain:  No focal pain. Review of systems:  All other systems reviewed and found to be negative. As per HPI. Otherwise, a complete review of systems is negatve.  PAST MEDICAL HISTORY: Past Medical History  Diagnosis Date  . Prostate cancer   . Osteomyelitis   . Hypocalcemia     PAST SURGICAL HISTORY: Past Surgical History  Procedure Laterality Date  . Excisional hemorrhoidectomy    . Tonsillectomy  FAMILY HISTORY  No Known Allergies:   Significant History/PMH:   Hypocalcemia:    Osteomyelitis:    Prostate Cancer:    Right Pinning of Intertrochanteric Femur Fracture  Right: 01-Sep-2012   Hemorrhoidectomy:    Tonsillectomy:   Preventive Screening:  Has patient had any of the following test? Colonscopy  Prostate Exam   Last Colonoscopy: 2011   Last  Prostate Exam: August 2012   Smoking History: Smoking History 0.5 Packs per day. Smoking Cessation Information Given to Patient .  PFSH: Family History: No family history of colorectal cancer, breast cancer, or ovarian cancer.  Comments: Patient does not smoke does not drink  Additional Past Medical and Surgical History: Hyper cholesteremia  No other significant past medical history        ADVANCED DIRECTIVES:    HEALTH MAINTENANCE: History  Substance Use Topics  . Smoking status: Current Every Day Smoker  . Smokeless tobacco: Not on file  . Alcohol Use: Not on file      No Known Allergies  Current Outpatient Prescriptions  Medication Sig Dispense Refill  . calcium-vitamin D (OSCAL WITH D) 500-200 MG-UNIT per tablet Take 1 tablet by mouth.    . Multiple Vitamin (MULTIVITAMIN) tablet Take 1 tablet by mouth daily.    . pravastatin (PRAVACHOL) 20 MG tablet Take 20 mg by mouth daily.    . Vitamin D, Ergocalciferol, (DRISDOL) 50000 UNITS CAPS capsule Take 50,000 Units by mouth once a week.  0  . XTANDI 40 MG capsule     . calcium-vitamin D 250-100 MG-UNIT per tablet Take 1 tablet by mouth 2 (two) times daily.     No current facility-administered medications for this visit.    OBJECTIVE:  Filed Vitals:   11/24/14 1502  BP: 127/72  Pulse: 62  Temp: 97.1 F (36.2 C)     Body mass index is 23.23 kg/(m^2).    ECOG FS:1 - Symptomatic but completely ambulatory  PHYSICAL EXAM: GENERAL:  Well developed, well nourished, sitting comfortably in the exam room in no acute distress. MENTAL STATUS:  Alert and oriented to person, place and time. HEAD:   Normocephalic, atraumatic, face symmetric, no Cushingoid features. EYES:  .  Pupils equal round and reactive to light and accomodation.  No conjunctivitis or scleral icterus. ENT:  Oropharynx clear without lesion.  Tongue normal. Mucous membranes moist.  RESPIRATORY:  Clear to auscultation without rales, wheezes or  rhonchi. CARDIOVASCULAR:  Regular rate and rhythm without murmur, rub or gallop.  ABDOMEN:  Soft, non-tender, with active bowel sounds, and no hepatosplenomegaly.  No masses. BACK:  No CVA tenderness.  No tenderness on percussion of the back or rib cage. SKIN:  No rashes, ulcers or lesions. EXTREMITIES: No edema, no skin discoloration or tenderness.  No palpable cords. LYMPH NODES: No palpable cervical, supraclavicular, axillary or inguinal adenopathy  NEUROLOGICAL: Unremarkable. PSYCH:  Appropriate.   LAB RESULTS:  Appointment on 11/24/2014  Component Date Value Ref Range Status  . WBC 11/24/2014 5.3  3.8 - 10.6 K/uL Final  . RBC 11/24/2014 3.89* 4.40 - 5.90 MIL/uL Final  . Hemoglobin 11/24/2014 12.0* 13.0 - 18.0 g/dL Final  . HCT 11/24/2014 36.2* 40.0 - 52.0 % Final  . MCV 11/24/2014 92.9  80.0 - 100.0 fL Final  . MCH 11/24/2014 30.8  26.0 - 34.0 pg Final  . MCHC 11/24/2014 33.2  32.0 - 36.0 g/dL Final  . RDW 11/24/2014 13.8  11.5 - 14.5 % Final  . Platelets 11/24/2014 237  150 -  440 K/uL Final  . Neutrophils Relative % 11/24/2014 52   Final  . Neutro Abs 11/24/2014 2.8  1.4 - 6.5 K/uL Final  . Lymphocytes Relative 11/24/2014 36   Final  . Lymphs Abs 11/24/2014 1.9  1.0 - 3.6 K/uL Final  . Monocytes Relative 11/24/2014 8   Final  . Monocytes Absolute 11/24/2014 0.4  0.2 - 1.0 K/uL Final  . Eosinophils Relative 11/24/2014 3   Final  . Eosinophils Absolute 11/24/2014 0.1  0 - 0.7 K/uL Final  . Basophils Relative 11/24/2014 1   Final  . Basophils Absolute 11/24/2014 0.1  0 - 0.1 K/uL Final  . Sodium 11/24/2014 135  135 - 145 mmol/L Final  . Potassium 11/24/2014 3.8  3.5 - 5.1 mmol/L Final  . Chloride 11/24/2014 104  101 - 111 mmol/L Final  . CO2 11/24/2014 24  22 - 32 mmol/L Final  . Glucose, Bld 11/24/2014 132* 65 - 99 mg/dL Final  . BUN 11/24/2014 13  6 - 20 mg/dL Final  . Creatinine, Ser 11/24/2014 0.58* 0.61 - 1.24 mg/dL Final  . Calcium 11/24/2014 8.6* 8.9 - 10.3 mg/dL  Final  . Total Protein 11/24/2014 6.8  6.5 - 8.1 g/dL Final  . Albumin 11/24/2014 4.2  3.5 - 5.0 g/dL Final  . AST 11/24/2014 20  15 - 41 U/L Final  . ALT 11/24/2014 15* 17 - 63 U/L Final  . Alkaline Phosphatase 11/24/2014 78  38 - 126 U/L Final  . Total Bilirubin 11/24/2014 0.5  0.3 - 1.2 mg/dL Final  . GFR calc non Af Amer 11/24/2014 >60  >60 mL/min Final  . GFR calc Af Amer 11/24/2014 >60  >60 mL/min Final   Comment: (NOTE) The eGFR has been calculated using the CKD EPI equation. This calculation has not been validated in all clinical situations. eGFR's persistently <60 mL/min signify possible Chronic Kidney Disease.   . Anion gap 11/24/2014 7  5 - 15 Final  . PSA 11/24/2014 2.12  0.00 - 4.00 ng/mL Final   Comment: (NOTE) While PSA levels of <=4.0 ng/ml are reported as reference range, some men with levels below 4.0 ng/ml can have prostate cancer and many men with PSA above 4.0 ng/ml do not have prostate cancer.  Other tests such as free PSA, age specific reference ranges, PSA velocity and PSA doubling time may be helpful especially in men less than 42 years old. Performed at [facility]      Lab Results  Component Value Date   PSA 2.12 11/24/2014        ASSESSMENT: 79 year old gentleman with stage IV carcinoma prostate.  Presently onXTANDI and Lupron. Continue Lupron Primary physicians record have been evaluated last PSA was 2.3 with the [facility]  We repeated PSA which was 2.12 in with the same lab. Review of minimal increase in the PSA would like to continue present approach with Lupron AND XTANDI All other lab late has been reviewed Patient was assured As an continue to smoke has also pulmonary nodule which will be followed   .  MEDICAL DECISION MAKING:  Continue Lupron and xtandi All the lab data has been reviewed from primary care physician's office Patient expressed understanding and was in agreement with this plan. He also understands that He  can call clinic at any time with any questions, concerns, or complaints.    No matching staging information was found for the patient.  Forest Gleason, MD   11/25/2014 8:09 AM

## 2014-11-25 NOTE — Telephone Encounter (Signed)
PSA reported at 2.12. Per MD, results are stable and pt will continue on current treatment plan. Pt's wife, Joaquim Lai, called and informed of results. Instructed pt's wife to order next dose of Xtandi. Joaquim Lai verbalized understanding.

## 2014-12-22 ENCOUNTER — Inpatient Hospital Stay: Payer: Medicare Other

## 2014-12-22 ENCOUNTER — Encounter: Payer: Self-pay | Admitting: Oncology

## 2014-12-22 ENCOUNTER — Inpatient Hospital Stay: Payer: Medicare Other | Attending: Oncology | Admitting: Oncology

## 2014-12-22 VITALS — BP 126/77 | HR 88 | Temp 98.0°F | Wt 171.2 lb

## 2014-12-22 DIAGNOSIS — F17210 Nicotine dependence, cigarettes, uncomplicated: Secondary | ICD-10-CM | POA: Diagnosis not present

## 2014-12-22 DIAGNOSIS — Z79899 Other long term (current) drug therapy: Secondary | ICD-10-CM | POA: Insufficient documentation

## 2014-12-22 DIAGNOSIS — C7951 Secondary malignant neoplasm of bone: Secondary | ICD-10-CM | POA: Diagnosis not present

## 2014-12-22 DIAGNOSIS — R972 Elevated prostate specific antigen [PSA]: Secondary | ICD-10-CM | POA: Insufficient documentation

## 2014-12-22 DIAGNOSIS — Z8781 Personal history of (healed) traumatic fracture: Secondary | ICD-10-CM | POA: Diagnosis not present

## 2014-12-22 DIAGNOSIS — C61 Malignant neoplasm of prostate: Secondary | ICD-10-CM | POA: Diagnosis not present

## 2014-12-22 DIAGNOSIS — Z8619 Personal history of other infectious and parasitic diseases: Secondary | ICD-10-CM | POA: Diagnosis not present

## 2014-12-22 DIAGNOSIS — Z79818 Long term (current) use of other agents affecting estrogen receptors and estrogen levels: Secondary | ICD-10-CM | POA: Insufficient documentation

## 2014-12-22 LAB — CBC WITH DIFFERENTIAL/PLATELET
BASOS ABS: 0.1 10*3/uL (ref 0–0.1)
Basophils Relative: 1 %
EOS PCT: 2 %
Eosinophils Absolute: 0.1 10*3/uL (ref 0–0.7)
HCT: 37 % — ABNORMAL LOW (ref 40.0–52.0)
Hemoglobin: 12.7 g/dL — ABNORMAL LOW (ref 13.0–18.0)
Lymphocytes Relative: 36 %
Lymphs Abs: 2 10*3/uL (ref 1.0–3.6)
MCH: 31.3 pg (ref 26.0–34.0)
MCHC: 34.2 g/dL (ref 32.0–36.0)
MCV: 91.5 fL (ref 80.0–100.0)
MONO ABS: 0.4 10*3/uL (ref 0.2–1.0)
Monocytes Relative: 8 %
Neutro Abs: 2.9 10*3/uL (ref 1.4–6.5)
Neutrophils Relative %: 53 %
Platelets: 224 10*3/uL (ref 150–440)
RBC: 4.05 MIL/uL — AB (ref 4.40–5.90)
RDW: 13.6 % (ref 11.5–14.5)
WBC: 5.4 10*3/uL (ref 3.8–10.6)

## 2014-12-22 LAB — COMPREHENSIVE METABOLIC PANEL
ALT: 15 U/L — AB (ref 17–63)
AST: 19 U/L (ref 15–41)
Albumin: 4.1 g/dL (ref 3.5–5.0)
Alkaline Phosphatase: 67 U/L (ref 38–126)
Anion gap: 4 — ABNORMAL LOW (ref 5–15)
BUN: 13 mg/dL (ref 6–20)
CHLORIDE: 106 mmol/L (ref 101–111)
CO2: 26 mmol/L (ref 22–32)
CREATININE: 0.68 mg/dL (ref 0.61–1.24)
Calcium: 8.5 mg/dL — ABNORMAL LOW (ref 8.9–10.3)
GFR calc non Af Amer: >60 mL/min (ref >60)
Glucose, Bld: 112 mg/dL — ABNORMAL HIGH (ref 65–99)
POTASSIUM: 4.1 mmol/L (ref 3.5–5.1)
SODIUM: 136 mmol/L (ref 135–145)
Total Bilirubin: 0.6 mg/dL (ref 0.3–1.2)
Total Protein: 6.9 g/dL (ref 6.5–8.1)

## 2014-12-22 LAB — PSA: PSA: 2.49 ng/mL (ref 0.00–4.00)

## 2014-12-22 NOTE — Progress Notes (Signed)
Patient does not have living will.  Current every day smoker. 

## 2014-12-24 ENCOUNTER — Other Ambulatory Visit: Payer: Self-pay | Admitting: *Deleted

## 2014-12-24 DIAGNOSIS — C61 Malignant neoplasm of prostate: Secondary | ICD-10-CM

## 2014-12-24 MED ORDER — XTANDI 40 MG PO CAPS: 160.0000 mg | ORAL_CAPSULE | Freq: Every day | ORAL | Status: DC

## 2015-01-02 ENCOUNTER — Encounter: Payer: Self-pay | Admitting: Oncology

## 2015-01-02 NOTE — Progress Notes (Signed)
[place] @ [facility] Telephone:([phone]) [phone]  Fax:(336) [place] OB: Jan 17, 1936  MR#: [id number]  DJM#:[id number]  Patient Care Team: Lenard Simmer, MD as PCP - General (Endocrinology)  CHIEF COMPLAINT:  Chief Complaint  Patient presents with  . Follow-up   Oncology History   1. Carcinoma of prostate diagnosis in 2006.  Had received radiation therapy, external beam to prostate and pelvic lymph nodes.  Had Gleason 9 (4+5.)  PainBaseline PSA was 13. 2. Received Lupron injection after her radiation therapy also.  PSA was going up.  Last year or so received Lupron injection in September but did not have any followup.   3. Recently (this August, 2012) PSA was found to be 17. Patient was started on Lupron from September of 2012. 4. Patient has progressive disease by PSA criteria (October, 2013) 5. Patient was started on Zytiga and prednisone in November of 2013. 6.traumatic hip fracture ((right femur) status post internally fixed ,no evidence of metastatic disease(May of 2014) 7.bone scan in July of 2014 at outside institutions shows metastases to left scapula and left 11th rib/.. 8.progressive On ZYTIGA by PSA criteria.  Will start patient on Xtandi, September of2015       Oncology Flowsheet 11/24/2014  leuprolide (LUPRON) IM 22.5 mg    INTERVAL HISTORY: 79 year old gentleman was a chronic smoker and is stage IV carcinoma prostate presently onXTANDI and on Lupron.  Patient's appetite is getting better.  Patient continues to smoke.  No difficulty passing urine.  Tolerating treatment very well.  Here for further follow-up and treatment consideration He  is tolerating treatment very well.  No rash.  No abdominal pain.  No nausea.  Complains of hot flashes Stage IV carcinoma prostate July, 2016 Patient is here for further follow-up regarding carcinoma prostate.  Stage IV disease.  Patient has been evaluated by primary care physician.  PSA was slightly  elevated 2.3.  Patient has a skin lesion removed which was negative for any malignancy.  Does not have any aches and pains.  Patient continues to smoke. The records from primary care physician has been available for review patient is quite concerned about high PSA TAKING XTANDI a regular basis August, 2016 Patient is here for ongoing evaluation and continuation of therapy for stage IV carcinoma of prostate.  Patient continues to smoke. No bony pains. Here for further follow-up and treatment considerations   REVIEW OF SYSTEMS:   GENERAL:  Feels good.  Active.  No fevers, sweats or weight loss. PERFORMANCE STATUS (ECOG):  01 HEENT:  No visual changes, runny nose, sore throat, mouth sores or tenderness. Lungs: No shortness of breath or cough.  No hemoptysis. Cardiac:  No chest pain, palpitations, orthopnea, or PND. GI:  No nausea, vomiting, diarrhea, constipation, melena or hematochezia. GU:  No urgency, frequency, dysuria, or hematuria. Musculoskeletal:  No back pain.  No joint pain.  No muscle tenderness. Extremities:  No pain or swelling. Skin:  No rashes or skin changes. Neuro:  No headache, numbness or weakness, balance or coordination issues. Endocrine:  No diabetes, thyroid issues, hot flashes or night sweats. Psych:  No mood changes, depression or anxiety. Pain:  No focal pain. Review of systems:  All other systems reviewed and found to be negative. As per HPI. Otherwise, a complete review of systems is negatve.  PAST MEDICAL HISTORY: Past Medical History  Diagnosis Date  . Prostate cancer   . Osteomyelitis   . Hypocalcemia     PAST SURGICAL  HISTORY: Past Surgical History  Procedure Laterality Date  . Excisional hemorrhoidectomy    . Tonsillectomy      FAMILY HISTORY  No Known Allergies:   Significant History/PMH:   Hypocalcemia:    Osteomyelitis:    Prostate Cancer:    Right Pinning of Intertrochanteric Femur Fracture  Right: 01-Sep-2012     Hemorrhoidectomy:    Tonsillectomy:   Preventive Screening:  Has patient had any of the following test? Colonscopy  Prostate Exam   Last Colonoscopy: 2011   Last Prostate Exam: August 2012   Smoking History: Smoking History 0.5 Packs per day. Smoking Cessation Information Given to Patient .  PFSH: Family History: No family history of colorectal cancer, breast cancer, or ovarian cancer.  Comments: Patient does not smoke does not drink  Additional Past Medical and Surgical History: Hyper cholesteremia  No other significant past medical history        ADVANCED DIRECTIVES:    HEALTH MAINTENANCE: Social History  Substance Use Topics  . Smoking status: Current Every Day Smoker  . Smokeless tobacco: None  . Alcohol Use: None      No Known Allergies  Current Outpatient Prescriptions  Medication Sig Dispense Refill  . calcium-vitamin D (OSCAL WITH D) 500-200 MG-UNIT per tablet Take 1 tablet by mouth.    . calcium-vitamin D 250-100 MG-UNIT per tablet Take 1 tablet by mouth 2 (two) times daily.    . Multiple Vitamin (MULTIVITAMIN) tablet Take 1 tablet by mouth daily.    . pravastatin (PRAVACHOL) 20 MG tablet Take 20 mg by mouth daily.    . Vitamin D, Ergocalciferol, (DRISDOL) 50000 UNITS CAPS capsule Take 50,000 Units by mouth once a week.  0  . XTANDI 40 MG capsule Take 4 capsules (160 mg total) by mouth daily. 120 capsule 3   No current facility-administered medications for this visit.    OBJECTIVE:  Filed Vitals:   12/22/14 1510  BP: 126/77  Pulse: 88  Temp: 98 F (36.7 C)     Body mass index is 23.22 kg/(m^2).    ECOG FS:1 - Symptomatic but completely ambulatory  PHYSICAL EXAM: GENERAL:  Well developed, well nourished, sitting comfortably in the exam room in no acute distress. MENTAL STATUS:  Alert and oriented to person, place and time. HEAD:   Normocephalic, atraumatic, face symmetric, no Cushingoid features. EYES:  .  Pupils equal round and reactive to  light and accomodation.  No conjunctivitis or scleral icterus. ENT:  Oropharynx clear without lesion.  Tongue normal. Mucous membranes moist.  RESPIRATORY:  Clear to auscultation without rales, wheezes or rhonchi. CARDIOVASCULAR:  Regular rate and rhythm without murmur, rub or gallop.  ABDOMEN:  Soft, non-tender, with active bowel sounds, and no hepatosplenomegaly.  No masses. BACK:  No CVA tenderness.  No tenderness on percussion of the back or rib cage. SKIN:  No rashes, ulcers or lesions. EXTREMITIES: No edema, no skin discoloration or tenderness.  No palpable cords. LYMPH NODES: No palpable cervical, supraclavicular, axillary or inguinal adenopathy  NEUROLOGICAL: Unremarkable. PSYCH:  Appropriate.   LAB RESULTS:  Component     Latest Ref Rng 07/29/2014 08/25/2014 09/22/2014 10/27/2014 11/24/2014  PSA     0.00 - 4.00 ng/mL 1.6 2.0 1.99 1.79 2.12   Component     Latest Ref Rng 12/22/2014  PSA     0.00 - 4.00 ng/mL 2.49     Lab Results  Component Value Date   PSA 2.49 12/22/2014        ASSESSMENT:  79 year old gentleman with stage IV carcinoma prostate.  Presently onXTANDI and Lupron. Continue Lupron Patient is showing slowly rising PSA.  He remains asymptomatic.  At present time we will continue patient on XTANDI. And reevaluate with another PSA in 4-6 weeks and he continues to have problems another bone scan would be planned.   Marland Kitchen  MEDICAL DECISION MAKING:  Continue Lupron and xtandi All the lab data has been reviewed from primary care physician's office Patient expressed understanding and was in agreement with this plan. He also understands that He can call clinic at any time with any questions, concerns, or complaints.    No matching staging information was found for the patient.  Forest Gleason, MD   01/02/2015 8:32 PM

## 2015-01-19 ENCOUNTER — Ambulatory Visit: Payer: Medicare Other

## 2015-01-19 ENCOUNTER — Encounter: Payer: Self-pay | Admitting: Oncology

## 2015-01-19 ENCOUNTER — Inpatient Hospital Stay (HOSPITAL_BASED_OUTPATIENT_CLINIC_OR_DEPARTMENT_OTHER): Payer: Medicare Other | Admitting: Oncology

## 2015-01-19 ENCOUNTER — Inpatient Hospital Stay: Payer: Medicare Other | Attending: Oncology

## 2015-01-19 VITALS — BP 110/72 | HR 87 | Temp 97.9°F | Wt 164.0 lb

## 2015-01-19 DIAGNOSIS — C61 Malignant neoplasm of prostate: Secondary | ICD-10-CM

## 2015-01-19 DIAGNOSIS — Z23 Encounter for immunization: Secondary | ICD-10-CM | POA: Diagnosis not present

## 2015-01-19 DIAGNOSIS — Z923 Personal history of irradiation: Secondary | ICD-10-CM

## 2015-01-19 DIAGNOSIS — Z79899 Other long term (current) drug therapy: Secondary | ICD-10-CM | POA: Insufficient documentation

## 2015-01-19 DIAGNOSIS — F17210 Nicotine dependence, cigarettes, uncomplicated: Secondary | ICD-10-CM | POA: Insufficient documentation

## 2015-01-19 DIAGNOSIS — Z8781 Personal history of (healed) traumatic fracture: Secondary | ICD-10-CM

## 2015-01-19 DIAGNOSIS — C7951 Secondary malignant neoplasm of bone: Secondary | ICD-10-CM

## 2015-01-19 DIAGNOSIS — Z9221 Personal history of antineoplastic chemotherapy: Secondary | ICD-10-CM | POA: Diagnosis not present

## 2015-01-19 LAB — CBC WITH DIFFERENTIAL/PLATELET
BASOS ABS: 0 10*3/uL (ref 0–0.1)
Basophils Relative: 1 %
Eosinophils Absolute: 0.1 10*3/uL (ref 0–0.7)
Eosinophils Relative: 1 %
HEMATOCRIT: 35.8 % — AB (ref 40.0–52.0)
HEMOGLOBIN: 11.9 g/dL — AB (ref 13.0–18.0)
LYMPHS PCT: 36 %
Lymphs Abs: 1.8 10*3/uL (ref 1.0–3.6)
MCH: 30.8 pg (ref 26.0–34.0)
MCHC: 33.2 g/dL (ref 32.0–36.0)
MCV: 92.9 fL (ref 80.0–100.0)
MONO ABS: 0.4 10*3/uL (ref 0.2–1.0)
Monocytes Relative: 8 %
NEUTROS ABS: 2.8 10*3/uL (ref 1.4–6.5)
Neutrophils Relative %: 54 %
Platelets: 239 10*3/uL (ref 150–440)
RBC: 3.86 MIL/uL — AB (ref 4.40–5.90)
RDW: 13.5 % (ref 11.5–14.5)
WBC: 5.1 10*3/uL (ref 3.8–10.6)

## 2015-01-19 LAB — COMPREHENSIVE METABOLIC PANEL
ALK PHOS: 75 U/L (ref 38–126)
ALT: 13 U/L — AB (ref 17–63)
AST: 22 U/L (ref 15–41)
Albumin: 4.1 g/dL (ref 3.5–5.0)
Anion gap: 7 (ref 5–15)
BILIRUBIN TOTAL: 0.6 mg/dL (ref 0.3–1.2)
BUN: 15 mg/dL (ref 6–20)
CO2: 25 mmol/L (ref 22–32)
CREATININE: 0.61 mg/dL (ref 0.61–1.24)
Calcium: 9.1 mg/dL (ref 8.9–10.3)
Chloride: 108 mmol/L (ref 101–111)
GFR calc Af Amer: >60 mL/min (ref >60)
GFR calc non Af Amer: >60 mL/min (ref >60)
Glucose, Bld: 104 mg/dL — ABNORMAL HIGH (ref 65–99)
Potassium: 4.3 mmol/L (ref 3.5–5.1)
Sodium: 140 mmol/L (ref 135–145)
TOTAL PROTEIN: 6.7 g/dL (ref 6.5–8.1)

## 2015-01-19 MED ORDER — INFLUENZA VAC SPLIT QUAD 0.5 ML IM SUSY
0.5000 mL | PREFILLED_SYRINGE | Freq: Once | INTRAMUSCULAR | Status: AC
  Administered 2015-01-19: 0.5 mL via INTRAMUSCULAR

## 2015-01-19 NOTE — Progress Notes (Signed)
[place] @ [facility] Telephone:(336) [phone]  Fax:(336) [place] OB: 12-19-1935  MR#: [id number]  VEL#:[id number]  Patient Care Team: Lenard Simmer, MD as PCP - General (Endocrinology)  CHIEF COMPLAINT:  No chief complaint on file.  Oncology History   1. Carcinoma of prostate diagnosis in 2006.  Had received radiation therapy, external beam to prostate and pelvic lymph nodes.  Had Gleason 9 (4+5.)  PainBaseline PSA was 13. 2. Received Lupron injection after her radiation therapy also.  PSA was going up.  Last year or so received Lupron injection in September but did not have any followup.   3. Recently (this August, 2012) PSA was found to be 17. Patient was started on Lupron from September of 2012. 4. Patient has progressive disease by PSA criteria (October, 2013) 5. Patient was started on Zytiga and prednisone in November of 2013. 6.traumatic hip fracture ((right femur) status post internally fixed ,no evidence of metastatic disease(May of 2014) 7.bone scan in July of 2014 at outside institutions shows metastases to left scapula and left 11th rib/.. 8.progressive On ZYTIGA by PSA criteria.  Will start patient on Xtandi, September of2015       Oncology Flowsheet 11/24/2014  leuprolide (LUPRON) IM 22.5 mg    INTERVAL HISTORY: 79 year old gentleman was a chronic smoker and is stage IV carcinoma prostate presently onXTANDI and on Lupron.  Patient's appetite is getting better.  Patient continues to smoke.  No difficulty passing urine.  Tolerating treatment very well.  Here for further follow-up and treatment consideration He  is tolerating treatment very well.  No rash.  No abdominal pain.  No nausea.  Complains of hot flashes Stage IV carcinoma prostate July, 2016 Patient is here for further follow-up regarding carcinoma prostate.  Stage IV disease.  Patient has been evaluated by primary care physician.  PSA was slightly elevated 2.3.  Patient has a  skin lesion removed which was negative for any malignancy.  Does not have any aches and pains.  Patient continues to smoke. The records from primary care physician has been available for review patient is quite concerned about high PSA TAKING XTANDI a regular basis August, 2016 Patient is here for ongoing evaluation and continuation of therapy for stage IV carcinoma of prostate.  Patient continues to smoke. No bony pains. Here for further follow-up and treatment considerations January 19, 2015 Patient is here for ongoing evaluation and continuation of extended therapy for carcinoma of prostate stage IV disease No bony pain.  Patient continues to smoke.  His PSA has been slowly inching up to 2.49  today's PSA is pending.  No nausea.  No vomiting.  No diarrhea.  Appetite has been stable.    REVIEW OF SYSTEMS:   GENERAL:  Feels good.  Active.  No fevers, sweats or weight loss. PERFORMANCE STATUS (ECOG):  01 HEENT:  No visual changes, runny nose, sore throat, mouth sores or tenderness. Lungs: No shortness of breath or cough.  No hemoptysis. Cardiac:  No chest pain, palpitations, orthopnea, or PND. GI:  No nausea, vomiting, diarrhea, constipation, melena or hematochezia. GU:  No urgency, frequency, dysuria, or hematuria. Musculoskeletal:  No back pain.  No joint pain.  No muscle tenderness. Extremities:  No pain or swelling. Skin:  No rashes or skin changes. Neuro:  No headache, numbness or weakness, balance or coordination issues. Endocrine:  No diabetes, thyroid issues, hot flashes or night sweats. Psych:  No mood changes, depression or anxiety. Pain:  No  focal pain. Review of systems:  All other systems reviewed and found to be negative. As per HPI. Otherwise, a complete review of systems is negatve.  PAST MEDICAL HISTORY: Past Medical History  Diagnosis Date  . Prostate cancer   . Osteomyelitis   . Hypocalcemia     PAST SURGICAL HISTORY: Past Surgical History  Procedure  Laterality Date  . Excisional hemorrhoidectomy    . Tonsillectomy      FAMILY HISTORY  No Known Allergies:   Significant History/PMH:   Hypocalcemia:    Osteomyelitis:    Prostate Cancer:    Right Pinning of Intertrochanteric Femur Fracture  Right: 01-Sep-2012   Hemorrhoidectomy:    Tonsillectomy:   Preventive Screening:  Has patient had any of the following test? Colonscopy  Prostate Exam   Last Colonoscopy: 2011   Last Prostate Exam: August 2012   Smoking History: Smoking History 0.5 Packs per day. Smoking Cessation Information Given to Patient .  PFSH: Family History: No family history of colorectal cancer, breast cancer, or ovarian cancer.  Comments: Patient does not smoke does not drink  Additional Past Medical and Surgical History: Hyper cholesteremia  No other significant past medical history        ADVANCED DIRECTIVES:    HEALTH MAINTENANCE: Social History  Substance Use Topics  . Smoking status: Current Every Day Smoker  . Smokeless tobacco: None  . Alcohol Use: None      No Known Allergies  Current Outpatient Prescriptions  Medication Sig Dispense Refill  . calcium-vitamin D (OSCAL WITH D) 500-200 MG-UNIT per tablet Take 1 tablet by mouth.    . calcium-vitamin D 250-100 MG-UNIT per tablet Take 1 tablet by mouth 2 (two) times daily.    . Multiple Vitamin (MULTIVITAMIN) tablet Take 1 tablet by mouth daily.    . pravastatin (PRAVACHOL) 20 MG tablet Take 20 mg by mouth daily.    . Vitamin D, Ergocalciferol, (DRISDOL) 50000 UNITS CAPS capsule Take 50,000 Units by mouth once a week.  0  . XTANDI 40 MG capsule Take 4 capsules (160 mg total) by mouth daily. 120 capsule 3   No current facility-administered medications for this visit.    OBJECTIVE:  Filed Vitals:   01/19/15 1423  BP: 110/72  Pulse: 87  Temp: 97.9 F (36.6 C)     Body mass index is 22.24 kg/(m^2).    ECOG FS:1 - Symptomatic but completely ambulatory  PHYSICAL  EXAM: GENERAL:  Well developed, well nourished, sitting comfortably in the exam room in no acute distress. MENTAL STATUS:  Alert and oriented to person, place and time. HEAD:   Normocephalic, atraumatic, face symmetric, no Cushingoid features. EYES:  .  Pupils equal round and reactive to light and accomodation.  No conjunctivitis or scleral icterus. ENT:  Oropharynx clear without lesion.  Tongue normal. Mucous membranes moist.  RESPIRATORY:  Clear to auscultation without rales, wheezes or rhonchi. CARDIOVASCULAR:  Regular rate and rhythm without murmur, rub or gallop.  ABDOMEN:  Soft, non-tender, with active bowel sounds, and no hepatosplenomegaly.  No masses. BACK:  No CVA tenderness.  No tenderness on percussion of the back or rib cage. SKIN:  No rashes, ulcers or lesions. EXTREMITIES: No edema, no skin discoloration or tenderness.  No palpable cords. LYMPH NODES: No palpable cervical, supraclavicular, axillary or inguinal adenopathy  NEUROLOGICAL: Unremarkable. PSYCH:  Appropriate.   LAB RESULTS:  Component     Latest Ref Rng 07/29/2014 08/25/2014 09/22/2014 10/27/2014 11/24/2014  PSA     0.00 -  4.00 ng/mL 1.6 2.0 1.99 1.79 2.12   Component     Latest Ref Rng 12/22/2014  PSA     0.00 - 4.00 ng/mL 2.49     Lab Results  Component Value Date   PSA 2.49 12/22/2014        ASSESSMENT: 79 year old gentleman with stage IV carcinoma prostate.  Presently onXTANDI and Lupron. Continue Lupron Patient is showing slowly rising PSA.  He remains asymptomatic.  At present time we will continue patient on XTANDI. And reevaluate with another PSA in 4-6 weeks and he continues to have problems another bone scan would be planned.  A PSA keeps on going up and a bone scan and a CT scan can be done.  For restaging Continue XTANDI patient is tolerating treatment very well .  MEDICAL DECISION MAKING:  Continue Lupron and xtandi All the lab data has been reviewed from primary care physician's  office Patient expressed understanding and was in agreement with this plan. He also understands that He can call clinic at any time with any questions, concerns, or complaints.    No matching staging information was found for the patient.  Forest Gleason, MD   01/19/2015 2:56 PM

## 2015-01-20 LAB — PSA: PSA: 2.6 ng/mL (ref 0.00–4.00)

## 2015-02-16 ENCOUNTER — Inpatient Hospital Stay (HOSPITAL_BASED_OUTPATIENT_CLINIC_OR_DEPARTMENT_OTHER): Payer: Medicare Other | Admitting: Oncology

## 2015-02-16 ENCOUNTER — Inpatient Hospital Stay: Payer: Medicare Other

## 2015-02-16 ENCOUNTER — Inpatient Hospital Stay: Payer: Medicare Other | Attending: Oncology

## 2015-02-16 VITALS — BP 136/78 | HR 65 | Temp 96.5°F | Wt 171.5 lb

## 2015-02-16 DIAGNOSIS — Z79899 Other long term (current) drug therapy: Secondary | ICD-10-CM

## 2015-02-16 DIAGNOSIS — F17210 Nicotine dependence, cigarettes, uncomplicated: Secondary | ICD-10-CM | POA: Diagnosis not present

## 2015-02-16 DIAGNOSIS — Z7951 Long term (current) use of inhaled steroids: Secondary | ICD-10-CM | POA: Insufficient documentation

## 2015-02-16 DIAGNOSIS — C61 Malignant neoplasm of prostate: Secondary | ICD-10-CM

## 2015-02-16 DIAGNOSIS — Z79818 Long term (current) use of other agents affecting estrogen receptors and estrogen levels: Secondary | ICD-10-CM | POA: Insufficient documentation

## 2015-02-16 DIAGNOSIS — Z8781 Personal history of (healed) traumatic fracture: Secondary | ICD-10-CM | POA: Insufficient documentation

## 2015-02-16 DIAGNOSIS — Z923 Personal history of irradiation: Secondary | ICD-10-CM | POA: Insufficient documentation

## 2015-02-16 LAB — CBC WITH DIFFERENTIAL/PLATELET
BASOS ABS: 0 10*3/uL (ref 0–0.1)
Basophils Relative: 1 %
Eosinophils Absolute: 0.1 10*3/uL (ref 0–0.7)
Eosinophils Relative: 3 %
HEMATOCRIT: 33.9 % — AB (ref 40.0–52.0)
HEMOGLOBIN: 11.7 g/dL — AB (ref 13.0–18.0)
LYMPHS PCT: 40 %
Lymphs Abs: 2 10*3/uL (ref 1.0–3.6)
MCH: 31.5 pg (ref 26.0–34.0)
MCHC: 34.4 g/dL (ref 32.0–36.0)
MCV: 91.6 fL (ref 80.0–100.0)
MONO ABS: 0.4 10*3/uL (ref 0.2–1.0)
Monocytes Relative: 7 %
NEUTROS ABS: 2.4 10*3/uL (ref 1.4–6.5)
Neutrophils Relative %: 49 %
Platelets: 217 10*3/uL (ref 150–440)
RBC: 3.7 MIL/uL — AB (ref 4.40–5.90)
RDW: 13.5 % (ref 11.5–14.5)
WBC: 4.9 10*3/uL (ref 3.8–10.6)

## 2015-02-16 LAB — COMPREHENSIVE METABOLIC PANEL
ALK PHOS: 65 U/L (ref 38–126)
ALT: 14 U/L — AB (ref 17–63)
AST: 23 U/L (ref 15–41)
Albumin: 4 g/dL (ref 3.5–5.0)
Anion gap: 5 (ref 5–15)
BILIRUBIN TOTAL: 0.6 mg/dL (ref 0.3–1.2)
BUN: 11 mg/dL (ref 6–20)
CO2: 24 mmol/L (ref 22–32)
CREATININE: 0.72 mg/dL (ref 0.61–1.24)
Calcium: 8.1 mg/dL — ABNORMAL LOW (ref 8.9–10.3)
Chloride: 105 mmol/L (ref 101–111)
GFR calc Af Amer: >60 mL/min (ref >60)
Glucose, Bld: 101 mg/dL — ABNORMAL HIGH (ref 65–99)
Potassium: 3.9 mmol/L (ref 3.5–5.1)
Sodium: 134 mmol/L — ABNORMAL LOW (ref 135–145)
TOTAL PROTEIN: 6.3 g/dL — AB (ref 6.5–8.1)

## 2015-02-16 MED ORDER — LEUPROLIDE ACETATE (3 MONTH) 22.5 MG IM KIT
22.5000 mg | PACK | Freq: Once | INTRAMUSCULAR | Status: AC
  Administered 2015-02-16: 22.5 mg via INTRAMUSCULAR
  Filled 2015-02-16: qty 22.5

## 2015-02-16 NOTE — Progress Notes (Signed)
Patient does have living will.  Current every day smoker. 

## 2015-02-17 LAB — PSA: PSA: 2.92 ng/mL (ref 0.00–4.00)

## 2015-02-18 ENCOUNTER — Telehealth: Payer: Self-pay | Admitting: *Deleted

## 2015-02-18 NOTE — Telephone Encounter (Signed)
Pt called to request results from recent PSA. PSA result of 2.92 given to patient. Pt verbalized understanding.

## 2015-02-20 ENCOUNTER — Encounter: Payer: Self-pay | Admitting: Oncology

## 2015-02-20 NOTE — Progress Notes (Signed)
[place] @ [facility] Telephone:([phone]) [phone]  Fax:(336) [place] OB: 07-27-1935  MR#: [id number]  IAX#:[id number]  Patient Care Team: Lenard Simmer, MD as PCP - General (Endocrinology)  CHIEF COMPLAINT:  Chief Complaint  Patient presents with  . OTHER   Oncology History   1. Carcinoma of prostate diagnosis in 2006.  Had received radiation therapy, external beam to prostate and pelvic lymph nodes.  Had Gleason 9 (4+5.)  PainBaseline PSA was 13. 2. Received Lupron injection after her radiation therapy also.  PSA was going up.  Last year or so received Lupron injection in September but did not have any followup.   3. Recently (this August, 2012) PSA was found to be 17. Patient was started on Lupron from September of 2012. 4. Patient has progressive disease by PSA criteria (October, 2013) 5. Patient was started on Zytiga and prednisone in November of 2013. 6.traumatic hip fracture ((right femur) status post internally fixed ,no evidence of metastatic disease(May of 2014) 7.bone scan in July of 2014 at outside institutions shows metastases to left scapula and left 11th rib/.. 8.progressive On ZYTIGA by PSA criteria.  Will start patient on Xtandi, September of2015       Oncology Flowsheet 11/24/2014 02/16/2015  leuprolide (LUPRON) IM 22.5 mg 22.5 mg    INTERVAL HISTORY: 79 year old gentleman was a chronic smoker and is stage IV carcinoma prostate presently onXTANDI and on Lupron.  Patient's appetite is getting better.  Patient continues to smoke.  No difficulty passing urine.  Tolerating treatment very well.  Here for further follow-up and treatment consideration He  is tolerating treatment very well.  No rash.  No abdominal pain.  No nausea.  Complains of hot flashes Stage IV carcinoma prostate He is taking   XTANDI and tolerating treatment very well.  Patient continues to smoke.  No chills or fever.  Appetite has been stable.   REVIEW OF SYSTEMS:     GENERAL:  Feels good.  Active.  No fevers, sweats or weight loss. PERFORMANCE STATUS (ECOG):  01 HEENT:  No visual changes, runny nose, sore throat, mouth sores or tenderness. Lungs: No shortness of breath or cough.  No hemoptysis. Cardiac:  No chest pain, palpitations, orthopnea, or PND. GI:  No nausea, vomiting, diarrhea, constipation, melena or hematochezia. GU:  No urgency, frequency, dysuria, or hematuria. Musculoskeletal:  No back pain.  No joint pain.  No muscle tenderness. Extremities:  No pain or swelling. Skin:  No rashes or skin changes. Neuro:  No headache, numbness or weakness, balance or coordination issues. Endocrine:  No diabetes, thyroid issues, hot flashes or night sweats. Psych:  No mood changes, depression or anxiety. Pain:  No focal pain. Review of systems:  All other systems reviewed and found to be negative. As per HPI. Otherwise, a complete review of systems is negatve.  PAST MEDICAL HISTORY: Past Medical History  Diagnosis Date  . Prostate cancer ([place])   . Osteomyelitis ([place])   . Hypocalcemia     PAST SURGICAL HISTORY: Past Surgical History  Procedure Laterality Date  . Excisional hemorrhoidectomy    . Tonsillectomy      FAMILY HISTORY  No Known Allergies:   Significant History/PMH:   Hypocalcemia:    Osteomyelitis:    Prostate Cancer:    Right Pinning of Intertrochanteric Femur Fracture  Right: 01-Sep-2012   Hemorrhoidectomy:    Tonsillectomy:   Preventive Screening:  Has patient had any of the following test? Colonscopy  Prostate Exam  Last Colonoscopy: 2011   Last Prostate Exam: August 2012   Smoking History: Smoking History 0.5 Packs per day. Smoking Cessation Information Given to Patient .  PFSH: Family History: No family history of colorectal cancer, breast cancer, or ovarian cancer.  Comments: Patient does not smoke does not drink  Additional Past Medical and Surgical History: Hyper cholesteremia  No other significant  past medical history        ADVANCED DIRECTIVES:    HEALTH MAINTENANCE: Social History  Substance Use Topics  . Smoking status: Current Every Day Smoker  . Smokeless tobacco: None  . Alcohol Use: None      No Known Allergies  Current Outpatient Prescriptions  Medication Sig Dispense Refill  . ADVAIR DISKUS 100-50 MCG/DOSE AEPB inhale 1 dose by mouth twice a day  0  . calcium-vitamin D (OSCAL WITH D) 500-200 MG-UNIT per tablet Take 1 tablet by mouth.    . Multiple Vitamin (MULTIVITAMIN) tablet Take 1 tablet by mouth daily.    . pravastatin (PRAVACHOL) 20 MG tablet Take 20 mg by mouth daily.    . Vitamin D, Ergocalciferol, (DRISDOL) 50000 UNITS CAPS capsule Take 50,000 Units by mouth once a week.  0  . XTANDI 40 MG capsule Take 4 capsules (160 mg total) by mouth daily. 120 capsule 3  . calcium-vitamin D 250-100 MG-UNIT per tablet Take 1 tablet by mouth 2 (two) times daily.     No current facility-administered medications for this visit.    OBJECTIVE:  Filed Vitals:   02/16/15 1506  BP: 136/78  Pulse: 65  Temp: 96.5 F (35.8 C)     Body mass index is 23.26 kg/(m^2).    ECOG FS:1 - Symptomatic but completely ambulatory  PHYSICAL EXAM: GENERAL:  Well developed, well nourished, sitting comfortably in the exam room in no acute distress. MENTAL STATUS:  Alert and oriented to person, place and time. HEAD:   Normocephalic, atraumatic, face symmetric, no Cushingoid features. EYES:  .  Pupils equal round and reactive to light and accomodation.  No conjunctivitis or scleral icterus. ENT:  Oropharynx clear without lesion.  Tongue normal. Mucous membranes moist.  RESPIRATORY:  Clear to auscultation without rales, wheezes or rhonchi. CARDIOVASCULAR:  Regular rate and rhythm without murmur, rub or gallop.  ABDOMEN:  Soft, non-tender, with active bowel sounds, and no hepatosplenomegaly.  No masses. BACK:  No CVA tenderness.  No tenderness on percussion of the back or rib  cage. SKIN:  No rashes, ulcers or lesions. EXTREMITIES: No edema, no skin discoloration or tenderness.  No palpable cords. LYMPH NODES: No palpable cervical, supraclavicular, axillary or inguinal adenopathy  NEUROLOGICAL: Unremarkable. PSYCH:  Appropriate.   LAB RESULTS:  Component     Latest Ref Rng 04/26/2014 07/29/2014 08/25/2014 09/22/2014 10/27/2014  PSA     0.00 - 4.00 ng/mL 1.0 1.6 2.0 1.99 1.79   Component     Latest Ref Rng 11/24/2014 12/22/2014 01/19/2015 02/16/2015  PSA     0.00 - 4.00 ng/mL 2.12 2.49 2.60 2.92      Lab Results  Component Value Date   PSA 2.92 02/16/2015        ASSESSMENT: 79 year old gentleman with stage IV carcinoma prostate.  Presently onXTANDI and Lupron. Continue Lupron Patient has progressively rising PSA without any CT scan or bone scan appearance of progressing disease. We will continue present approach with  XTANDI and Lupron. We again counseled regarding smoking scissors and program    Consider getting another his bone scan and CT  scan A PSA continues to go up or patient becomes symptomatic   No matching staging information was found for the patient.  Forest Gleason, MD   02/20/2015 3:36 PM

## 2015-02-22 DIAGNOSIS — R011 Cardiac murmur, unspecified: Secondary | ICD-10-CM | POA: Insufficient documentation

## 2015-02-22 DIAGNOSIS — R0681 Apnea, not elsewhere classified: Secondary | ICD-10-CM | POA: Insufficient documentation

## 2015-02-22 DIAGNOSIS — E782 Mixed hyperlipidemia: Secondary | ICD-10-CM | POA: Insufficient documentation

## 2015-03-02 DIAGNOSIS — I350 Nonrheumatic aortic (valve) stenosis: Secondary | ICD-10-CM | POA: Insufficient documentation

## 2015-04-11 ENCOUNTER — Inpatient Hospital Stay: Payer: Medicare Other | Attending: Oncology

## 2015-04-11 ENCOUNTER — Inpatient Hospital Stay (HOSPITAL_BASED_OUTPATIENT_CLINIC_OR_DEPARTMENT_OTHER): Payer: Medicare Other | Admitting: Oncology

## 2015-04-11 ENCOUNTER — Encounter: Payer: Self-pay | Admitting: Oncology

## 2015-04-11 ENCOUNTER — Encounter: Payer: Self-pay | Admitting: *Deleted

## 2015-04-11 ENCOUNTER — Other Ambulatory Visit: Payer: Self-pay

## 2015-04-11 ENCOUNTER — Ambulatory Visit: Payer: Self-pay | Admitting: Oncology

## 2015-04-11 VITALS — BP 138/81 | HR 78 | Temp 96.9°F | Wt 166.4 lb

## 2015-04-11 DIAGNOSIS — F17210 Nicotine dependence, cigarettes, uncomplicated: Secondary | ICD-10-CM

## 2015-04-11 DIAGNOSIS — Z9221 Personal history of antineoplastic chemotherapy: Secondary | ICD-10-CM

## 2015-04-11 DIAGNOSIS — C7951 Secondary malignant neoplasm of bone: Secondary | ICD-10-CM | POA: Insufficient documentation

## 2015-04-11 DIAGNOSIS — R9721 Rising PSA following treatment for malignant neoplasm of prostate: Secondary | ICD-10-CM

## 2015-04-11 DIAGNOSIS — C61 Malignant neoplasm of prostate: Secondary | ICD-10-CM | POA: Diagnosis not present

## 2015-04-11 DIAGNOSIS — Z8781 Personal history of (healed) traumatic fracture: Secondary | ICD-10-CM | POA: Insufficient documentation

## 2015-04-11 DIAGNOSIS — Z79899 Other long term (current) drug therapy: Secondary | ICD-10-CM | POA: Diagnosis not present

## 2015-04-11 DIAGNOSIS — Z923 Personal history of irradiation: Secondary | ICD-10-CM | POA: Insufficient documentation

## 2015-04-11 DIAGNOSIS — M869 Osteomyelitis, unspecified: Secondary | ICD-10-CM | POA: Insufficient documentation

## 2015-04-11 LAB — COMPREHENSIVE METABOLIC PANEL
ALBUMIN: 4.6 g/dL (ref 3.5–5.0)
ALK PHOS: 72 U/L (ref 38–126)
ALT: 17 U/L (ref 17–63)
AST: 26 U/L (ref 15–41)
Anion gap: 8 (ref 5–15)
BILIRUBIN TOTAL: 1.2 mg/dL (ref 0.3–1.2)
BUN: 12 mg/dL (ref 6–20)
CALCIUM: 8.9 mg/dL (ref 8.9–10.3)
CO2: 25 mmol/L (ref 22–32)
Chloride: 100 mmol/L — ABNORMAL LOW (ref 101–111)
Creatinine, Ser: 0.66 mg/dL (ref 0.61–1.24)
GLUCOSE: 103 mg/dL — AB (ref 65–99)
Potassium: 4 mmol/L (ref 3.5–5.1)
Sodium: 133 mmol/L — ABNORMAL LOW (ref 135–145)
TOTAL PROTEIN: 7.2 g/dL (ref 6.5–8.1)

## 2015-04-11 LAB — CBC WITH DIFFERENTIAL/PLATELET
BASOS ABS: 0.1 10*3/uL (ref 0–0.1)
Basophils Relative: 1 %
EOS PCT: 2 %
Eosinophils Absolute: 0.1 10*3/uL (ref 0–0.7)
HEMATOCRIT: 37.5 % — AB (ref 40.0–52.0)
HEMOGLOBIN: 12.6 g/dL — AB (ref 13.0–18.0)
LYMPHS ABS: 2.4 10*3/uL (ref 1.0–3.6)
LYMPHS PCT: 43 %
MCH: 31.5 pg (ref 26.0–34.0)
MCHC: 33.6 g/dL (ref 32.0–36.0)
MCV: 94 fL (ref 80.0–100.0)
MONO ABS: 0.4 10*3/uL (ref 0.2–1.0)
Monocytes Relative: 8 %
NEUTROS ABS: 2.6 10*3/uL (ref 1.4–6.5)
Neutrophils Relative %: 46 %
Platelets: 235 10*3/uL (ref 150–440)
RBC: 3.99 MIL/uL — AB (ref 4.40–5.90)
RDW: 13.9 % (ref 11.5–14.5)
WBC: 5.6 10*3/uL (ref 3.8–10.6)

## 2015-04-11 LAB — PSA: PSA: 4.68 ng/mL — ABNORMAL HIGH (ref 0.00–4.00)

## 2015-04-11 NOTE — Progress Notes (Signed)
[facility] Telephone:(336) [phone]  Fax:(336) [place] OB: 12/18/1973  MR#: [id number]  [id number]  Patient Care Team: Lenard Simmer, MD as PCP - General (Endocrinology)  CHIEF COMPLAINT:  Chief Complaint  Patient presents with  . Prostate Cancer   Oncology History   1. Carcinoma of prostate diagnosis in 2006.  Had received radiation therapy, external beam to prostate and pelvic lymph nodes.  Had Gleason 9 (4+5.)  PainBaseline PSA was 13. 2. Received Lupron injection after her radiation therapy also.  PSA was going up.  Last year or so received Lupron injection in September but did not have any followup.   3. Recently (this August, 2012) PSA was found to be 17. Patient was started on Lupron from September of 2012. 4. Patient has progressive disease by PSA criteria (October, 2013) 5. Patient was started on Zytiga and prednisone in November of 2013. 6.traumatic hip fracture ((right femur) status post internally fixed ,no evidence of metastatic disease(May of 2014) 7.bone scan in July of 2014 at outside institutions shows metastases to left scapula and left 11th rib/.. 8.progressive On ZYTIGA by PSA criteria.  Will start patient on Xtandi, September of2015       Oncology Flowsheet 11/24/2014 02/16/2015  leuprolide (LUPRON) IM 22.5 mg 22.5 mg    INTERVAL HISTORY: 79 year old gentleman was a chronic smoker and is stage IV carcinoma prostate presently onXTANDI and on Lupron.  Patient's appetite is getting better.  Patient continues to smoke.  No difficulty passing urine.  Tolerating treatment very well.  Here for further follow-up and treatment consideration He  is tolerating treatment very well.  No rash.  No abdominal pain.  No nausea.  Complains of hot flashes Stage IV carcinoma prostate He is taking   XTANDI and tolerating treatment very well.  Patient continues to smoke.  No chills or fever.  Appetite has been stable. Patient is  here for ongoing evaluation and treatment consideration.  No bony pain.  Patient has colonoscopy scheduled tomorrow and will be reviewed.  Had a recent check up with a cardiologist for echocardiogram and stress test   REVIEW OF SYSTEMS:   GENERAL:  Feels good.  Active.  No fevers, sweats or weight loss. PERFORMANCE STATUS (ECOG):  01 HEENT:  No visual changes, runny nose, sore throat, mouth sores or tenderness. Lungs: No shortness of breath or cough.  No hemoptysis. Cardiac:  No chest pain, palpitations, orthopnea, or PND. GI:  No nausea, vomiting, diarrhea, constipation, melena or hematochezia. GU:  No urgency, frequency, dysuria, or hematuria. Musculoskeletal:  No back pain.  No joint pain.  No muscle tenderness. Extremities:  No pain or swelling. Skin:  No rashes or skin changes. Neuro:  No headache, numbness or weakness, balance or coordination issues. Endocrine:  No diabetes, thyroid issues, hot flashes or night sweats. Psych:  No mood changes, depression or anxiety. Pain:  No focal pain. Review of systems:  All other systems reviewed and found to be negative. As per HPI. Otherwise, a complete review of systems is negatve.  PAST MEDICAL HISTORY: Past Medical History  Diagnosis Date  . Prostate cancer ([place])   . Osteomyelitis ([place])   . Hypocalcemia     PAST SURGICAL HISTORY: Past Surgical History  Procedure Laterality Date  . Excisional hemorrhoidectomy    . Tonsillectomy      FAMILY HISTORY  No Known Allergies:   Significant History/PMH:   Hypocalcemia:    Osteomyelitis:    Prostate  Cancer:    Right Pinning of Intertrochanteric Femur Fracture  Right: 01-Sep-2012   Hemorrhoidectomy:    Tonsillectomy:   Preventive Screening:  Has patient had any of the following test? Colonscopy  Prostate Exam   Last Colonoscopy: 2011   Last Prostate Exam: August 2012   Smoking History: Smoking History 0.5 Packs per day. Smoking Cessation Information Given to Patient  .  PFSH: Family History: No family history of colorectal cancer, breast cancer, or ovarian cancer.  Comments: Patient does not smoke does not drink  Additional Past Medical and Surgical History: Hyper cholesteremia  No other significant past medical history        ADVANCED DIRECTIVES:    HEALTH MAINTENANCE: Social History  Substance Use Topics  . Smoking status: Current Every Day Smoker  . Smokeless tobacco: None  . Alcohol Use: None      No Known Allergies  Current Outpatient Prescriptions  Medication Sig Dispense Refill  . ADVAIR DISKUS 100-50 MCG/DOSE AEPB inhale 1 dose by mouth twice a day  0  . calcium-vitamin D (OSCAL WITH D) 500-200 MG-UNIT per tablet Take 1 tablet by mouth.    . calcium-vitamin D 250-100 MG-UNIT per tablet Take 1 tablet by mouth 2 (two) times daily.    . Multiple Vitamin (MULTIVITAMIN) tablet Take 1 tablet by mouth daily.    . pravastatin (PRAVACHOL) 20 MG tablet Take 20 mg by mouth daily.    . Vitamin D, Ergocalciferol, (DRISDOL) 50000 UNITS CAPS capsule Take 50,000 Units by mouth once a week.  0  . XTANDI 40 MG capsule Take 4 capsules (160 mg total) by mouth daily. 120 capsule 3   No current facility-administered medications for this visit.    OBJECTIVE:  Filed Vitals:   04/11/15 0955  BP: 138/81  Pulse: 78  Temp: 96.9 F (36.1 C)     Body mass index is 22.57 kg/(m^2).    ECOG FS:1 - Symptomatic but completely ambulatory  PHYSICAL EXAM: GENERAL:  Well developed, well nourished, sitting comfortably in the exam room in no acute distress. MENTAL STATUS:  Alert and oriented to person, place and time. HEAD:   Normocephalic, atraumatic, face symmetric, no Cushingoid features. EYES:  .  Pupils equal round and reactive to light and accomodation.  No conjunctivitis or scleral icterus. ENT:  Oropharynx clear without lesion.  Tongue normal. Mucous membranes moist.  RESPIRATORY:  Clear to auscultation without rales, wheezes or  rhonchi. CARDIOVASCULAR:  Regular rate and rhythm without murmur, rub or gallop.  ABDOMEN:  Soft, non-tender, with active bowel sounds, and no hepatosplenomegaly.  No masses. BACK:  No CVA tenderness.  No tenderness on percussion of the back or rib cage. SKIN:  No rashes, ulcers or lesions. EXTREMITIES: No edema, no skin discoloration or tenderness.  No palpable cords. LYMPH NODES: No palpable cervical, supraclavicular, axillary or inguinal adenopathy  NEUROLOGICAL: Unremarkable. PSYCH:  Appropriate.   LAB RESULTS:  Component     Latest Ref Rng 04/26/2014 07/29/2014 08/25/2014 09/22/2014 10/27/2014  PSA     0.00 - 4.00 ng/mL 1.0 1.6 2.0 1.99 1.79   Component     Latest Ref Rng 11/24/2014 12/22/2014 01/19/2015 02/16/2015  PSA     0.00 - 4.00 ng/mL 2.12 2.49 2.60 2.92      Lab Results  Component Value Date   PSA 2.92 02/16/2015        ASSESSMENT: 79 year old gentleman with stage IV carcinoma prostate.  Presently onXTANDI and Lupron. Continue Lupron Patient has progressively rising PSA without  any CT scan or bone scan appearance of progressing disease. We will continue present approach with  XTANDI and Lupron. We again counseled regarding smoking scissors and program    Consider getting another his bone scan and CT scan A PSA continues to go up or patient becomes symptomatic Patient will get next Lupron on 25th of January will be reevaluated at that time. The new XTANDI all the lab data has been reviewed which remains stable  No matching staging information was found for the patient.  Forest Gleason, MD   04/11/2015 10:00 AM

## 2015-04-12 ENCOUNTER — Ambulatory Visit: Payer: Medicare Other | Admitting: Anesthesiology

## 2015-04-12 ENCOUNTER — Encounter
Admission: RE | Disposition: A | Discharge status: Home / Self Care | Payer: Self-pay | Source: Ambulatory Visit | Attending: Gastroenterology

## 2015-04-12 ENCOUNTER — Ambulatory Visit
Admission: RE | Admit: 2015-04-12 | Discharge: 2015-04-12 | Disposition: A | Discharge status: Home / Self Care | Payer: Medicare Other | Source: Ambulatory Visit | Attending: Gastroenterology | Admitting: Gastroenterology

## 2015-04-12 DIAGNOSIS — Z800 Family history of malignant neoplasm of digestive organs: Secondary | ICD-10-CM | POA: Insufficient documentation

## 2015-04-12 DIAGNOSIS — D122 Benign neoplasm of ascending colon: Secondary | ICD-10-CM | POA: Diagnosis not present

## 2015-04-12 DIAGNOSIS — E785 Hyperlipidemia, unspecified: Secondary | ICD-10-CM | POA: Insufficient documentation

## 2015-04-12 DIAGNOSIS — D124 Benign neoplasm of descending colon: Secondary | ICD-10-CM | POA: Diagnosis not present

## 2015-04-12 DIAGNOSIS — Z79899 Other long term (current) drug therapy: Secondary | ICD-10-CM | POA: Insufficient documentation

## 2015-04-12 DIAGNOSIS — Z8546 Personal history of malignant neoplasm of prostate: Secondary | ICD-10-CM | POA: Insufficient documentation

## 2015-04-12 DIAGNOSIS — Z1211 Encounter for screening for malignant neoplasm of colon: Secondary | ICD-10-CM | POA: Insufficient documentation

## 2015-04-12 DIAGNOSIS — I350 Nonrheumatic aortic (valve) stenosis: Secondary | ICD-10-CM | POA: Insufficient documentation

## 2015-04-12 DIAGNOSIS — R011 Cardiac murmur, unspecified: Secondary | ICD-10-CM | POA: Diagnosis not present

## 2015-04-12 DIAGNOSIS — K5730 Diverticulosis of large intestine without perforation or abscess without bleeding: Secondary | ICD-10-CM | POA: Insufficient documentation

## 2015-04-12 DIAGNOSIS — K621 Rectal polyp: Secondary | ICD-10-CM | POA: Diagnosis not present

## 2015-04-12 DIAGNOSIS — K648 Other hemorrhoids: Secondary | ICD-10-CM | POA: Diagnosis not present

## 2015-04-12 HISTORY — DX: Shortness of breath: R06.02

## 2015-04-12 HISTORY — DX: Hyperlipidemia, unspecified: E78.5

## 2015-04-12 HISTORY — DX: Cardiac murmur, unspecified: R01.1

## 2015-04-12 HISTORY — PX: COLONOSCOPY WITH PROPOFOL: SHX5780

## 2015-04-12 SURGERY — COLONOSCOPY WITH PROPOFOL
Anesthesia: General

## 2015-04-12 MED ORDER — EPHEDRINE SULFATE 50 MG/ML IJ SOLN
INTRAMUSCULAR | Status: DC | PRN
  Administered 2015-04-12: 10 mg via INTRAVENOUS
  Administered 2015-04-12: 5 mg via INTRAVENOUS
  Administered 2015-04-12: 10 mg via INTRAVENOUS

## 2015-04-12 MED ORDER — FENTANYL CITRATE (PF) 100 MCG/2ML IJ SOLN
INTRAMUSCULAR | Status: DC | PRN
  Administered 2015-04-12 (×2): 25 ug via INTRAVENOUS

## 2015-04-12 MED ORDER — SODIUM CHLORIDE 0.9 % IV SOLN: INTRAVENOUS | Status: DC

## 2015-04-12 MED ORDER — PROPOFOL 500 MG/50ML IV EMUL
INTRAVENOUS | Status: DC | PRN
  Administered 2015-04-12: 100 ug/kg/min via INTRAVENOUS

## 2015-04-12 MED ORDER — LIDOCAINE HCL (PF) 2 % IJ SOLN
INTRAMUSCULAR | Status: DC | PRN
  Administered 2015-04-12: 50 mg

## 2015-04-12 MED ORDER — SODIUM CHLORIDE 0.9 % IV SOLN
INTRAVENOUS | Status: DC
  Administered 2015-04-12: 09:00:00 via INTRAVENOUS

## 2015-04-12 MED ORDER — PROPOFOL 10 MG/ML IV BOLUS
INTRAVENOUS | Status: DC | PRN
  Administered 2015-04-12: 30 mg via INTRAVENOUS
  Administered 2015-04-12 (×2): 10 mg via INTRAVENOUS
  Administered 2015-04-12: 20 mg via INTRAVENOUS

## 2015-04-12 NOTE — Anesthesia Postprocedure Evaluation (Signed)
Anesthesia Post Note  Patient: [facility]  Procedure(s) Performed: Procedure(s) (LRB): COLONOSCOPY WITH PROPOFOL (N/A)  Patient location during evaluation: PACU Anesthesia Type: General Level of consciousness: awake and alert Pain management: pain level controlled Vital Signs Assessment: post-procedure vital signs reviewed and stable Respiratory status: spontaneous breathing, nonlabored ventilation, respiratory function stable and patient connected to nasal cannula oxygen Cardiovascular status: blood pressure returned to baseline and stable Postop Assessment: no signs of nausea or vomiting Anesthetic complications: no    Last Vitals:  Filed Vitals:   04/12/15 1110 04/12/15 1120  BP: 112/63 106/76  Pulse: 73 69  Temp:    Resp: 14 15    Last Pain: There were no vitals filed for this visit.               Molli Barrows

## 2015-04-12 NOTE — H&P (Signed)
Outpatient short stay form Pre-procedure 04/12/2015 9:53 AM Preston Sails MD  Primary Physician: Dr. Belinda Fisher  Reason for visit:  Colonoscopy  History of present illness:  Patient is a 79 year old male presenting today for colonoscopy. He has had several colonoscopies in the past with removal of adenomatous-type polyps. His father had colon cancer. He tolerated his prep well. He denies use of any blood thinning agents. He takes no aspirin or aspirin products.  He was recently found to have a relatively new heart murmur and found have aortic stenosis on echocardiogram. We'll review the study.  Current facility-administered medications:  .  0.9 %  sodium chloride infusion, , Intravenous, Continuous, Preston Sails, MD, Last Rate: 20 mL/hr at 04/12/15 0849 .  0.9 %  sodium chloride infusion, , Intravenous, Continuous, Preston Sails, MD  Prescriptions prior to admission  Medication Sig Dispense Refill Last Dose  . XTANDI 40 MG capsule Take 4 capsules (160 mg total) by mouth daily. 120 capsule 3 04/11/2015 at 0900  . ADVAIR DISKUS 100-50 MCG/DOSE AEPB inhale 1 dose by mouth twice a day  0 Taking  . calcium-vitamin D (OSCAL WITH D) 500-200 MG-UNIT per tablet Take 1 tablet by mouth.   Taking  . calcium-vitamin D 250-100 MG-UNIT per tablet Take 1 tablet by mouth 2 (two) times daily.   Taking  . Multiple Vitamin (MULTIVITAMIN) tablet Take 1 tablet by mouth daily.   Taking  . pravastatin (PRAVACHOL) 20 MG tablet Take 20 mg by mouth daily.   Taking  . Vitamin D, Ergocalciferol, (DRISDOL) 50000 UNITS CAPS capsule Take 50,000 Units by mouth once a week.  0 Taking     No Known Allergies   Past Medical History  Diagnosis Date  . Prostate cancer ([place])   . Osteomyelitis ([place])   . Hypocalcemia   . Hyperlipidemia   . Heart murmur   . SOB (shortness of breath)     Review of systems:      Physical Exam    Heart and lungs: Regular rate and rhythm without rub or gallop, lungs  are bilaterally clear. There is a 2/4 aortic murmur.    HEENT: Normocephalic atraumatic eyes are anicteric    Other:     Pertinant exam for procedure: Soft nontender nondistended bowel sounds positive normoactive.    Planned proceedures: Colonoscopy and indicated procedures. I have discussed the risks benefits and complications of procedures to include not limited to bleeding, infection, perforation and the risk of sedation and the patient wishes to proceed.    Preston Sails, MD Gastroenterology 04/12/2015  9:53 AM

## 2015-04-12 NOTE — Op Note (Signed)
[facility] Gastroenterology Patient Name: Preston Wilson Procedure Date: 04/12/2015 9:53 AM MRN: [id number] Account #: [id number] Date of Birth: [birth year] Admit Type: Outpatient Age: 79 Room: [facility] ENDO ROOM 3 Gender: Male Note Status: Finalized Procedure:         Colonoscopy Indications:       Family history of colon cancer in a first-degree relative,                     Personal history of colonic polyps Providers:         Lollie Sails, MD Referring MD:      Lenard Simmer, MD (Referring MD) Medicines:         Monitored Anesthesia Care Complications:     No immediate complications. Procedure:         Pre-Anesthesia Assessment:                    - ASA Grade Assessment: III - A patient with severe                     systemic disease.                    After obtaining informed consent, the colonoscope was                     passed under direct vision. Throughout the procedure, the                     patient's blood pressure, pulse, and oxygen saturations                     were monitored continuously. The Olympus PCF-H180AL                     colonoscope ( S#: [id number] ) was introduced through the                     anus and advanced to the the cecum, identified by                     appendiceal orifice and ileocecal valve. The colonoscopy                     was performed with moderate difficulty due to poor bowel                     prep. Successful completion of the procedure was aided by                     lavage. The patient tolerated the procedure well. Findings:      Multiple medium-mouthed diverticula were found in the sigmoid colon and       in the descending colon.      A 8 mm polyp was found at the ileocecal valve. The polyp was sessile.       The polyp was removed with a cold biopsy forceps. The polyp was removed       with a cold snare. The polyp was removed with a lift and cut technique       using a cold snare. Resection and  retrieval were complete.      Two sessile polyps were found in the ascending colon and in the proximal       ascending colon.  The polyps were 2 to 3 mm in size. These polyps were       removed with a cold snare. Resection and retrieval were complete.      A 3 mm polyp was found in the descending colon. The polyp was sessile.       The polyp was removed with a cold biopsy forceps. Resection and       retrieval were complete.      A 4 mm polyp was found in the rectum. The polyp was sessile. The polyp       was removed with a cold biopsy forceps. Resection and retrieval were       complete.      Non-bleeding internal hemorrhoids were found during retroflexion. The       hemorrhoids were small.      The digital rectal exam was normal. Impression:        - Diverticulosis in the sigmoid colon and in the                     descending colon.                    - One 8 mm polyp at the ileocecal valve. Resected and                     retrieved.                    - Two 2 to 3 mm polyps in the ascending colon and in the                     proximal ascending colon. Resected and retrieved.                    - One 3 mm polyp in the descending colon. Resected and                     retrieved.                    - One 4 mm polyp in the rectum. Resected and retrieved.                    - Non-bleeding internal hemorrhoids. Recommendation:    - Await pathology results.                    - Telephone GI clinic for pathology results in 1 week. Procedure Code(s): --- Professional ---                    [phone], Colonoscopy, flexible; with removal of tumor(s),                     polyp(s), or other lesion(s) by snare technique                    45380, 40, Colonoscopy, flexible; with biopsy, single or                     multiple Diagnosis Code(s): --- Professional ---                    K64.8, Other hemorrhoids                    D12.0, Benign neoplasm of cecum  D12.4, Benign  neoplasm of descending colon                    K62.1, Rectal polyp                    D12.2, Benign neoplasm of ascending colon                    Z80.0, Family history of malignant neoplasm of digestive                     organs                    Z86.010, Personal history of colonic polyps                    K57.30, Diverticulosis of large intestine without                     perforation or abscess without bleeding CPT copyright 2014 American Medical Association. All rights reserved. The codes documented in this report are preliminary and upon coder review may  be revised to meet current compliance requirements. Lollie Sails, MD 04/12/2015 10:46:52 AM This report has been signed electronically. Number of Addenda: 0 Note Initiated On: 04/12/2015 9:53 AM Scope Withdrawal Time: 0 hours 11 minutes 24 seconds  Total Procedure Duration: 0 hours 34 minutes 58 seconds       [facility]

## 2015-04-12 NOTE — Transfer of Care (Signed)
Immediate Anesthesia Transfer of Care Note  Patient: Preston Wilson  Procedure(s) Performed: Procedure(s): COLONOSCOPY WITH PROPOFOL (N/A)  Patient Location: PACU  Anesthesia Type:General  Level of Consciousness: sedated  Airway & Oxygen Therapy: Patient Spontanous Breathing and Patient connected to nasal cannula oxygen  Post-op Assessment: Report given to RN and Post -op Vital signs reviewed and stable  Post vital signs: Reviewed and stable  Last Vitals:  Filed Vitals:   04/12/15 0831  BP: 126/59  Pulse: 73  Temp: 36 C  Resp: 16    Complications: No apparent anesthesia complications

## 2015-04-12 NOTE — Anesthesia Preprocedure Evaluation (Signed)
Anesthesia Evaluation  Patient identified by MRN, date of birth, ID band Patient awake    Reviewed: Allergy & Precautions, H&P , NPO status , Patient's Chart, lab work & pertinent test results, reviewed documented beta blocker date and time   Airway Mallampati: II  TM Distance: >3 FB Neck ROM: full    Dental no notable dental hx.    Pulmonary neg pulmonary ROS, shortness of breath, Current Smoker,    Pulmonary exam normal breath sounds clear to auscultation       Cardiovascular Exercise Tolerance: Good negative cardio ROS   Rhythm:regular Rate:Normal     Neuro/Psych negative neurological ROS  negative psych ROS   GI/Hepatic negative GI ROS, Neg liver ROS,   Endo/Other  negative endocrine ROS  Renal/GU negative Renal ROS  negative genitourinary   Musculoskeletal   Abdominal   Peds  Hematology negative hematology ROS (+)   Anesthesia Other Findings   Reproductive/Obstetrics negative OB ROS                             Anesthesia Physical Anesthesia Plan  ASA: II  Anesthesia Plan: General   Post-op Pain Management:    Induction:   Airway Management Planned:   Additional Equipment:   Intra-op Plan:   Post-operative Plan:   Informed Consent: I have reviewed the patients History and Physical, chart, labs and discussed the procedure including the risks, benefits and alternatives for the proposed anesthesia with the patient or authorized representative who has indicated his/her understanding and acceptance.   Dental Advisory Given  Plan Discussed with: CRNA  Anesthesia Plan Comments:         Anesthesia Quick Evaluation

## 2015-04-13 ENCOUNTER — Encounter: Payer: Self-pay | Admitting: Gastroenterology

## 2015-04-13 ENCOUNTER — Telehealth: Payer: Self-pay | Admitting: *Deleted

## 2015-04-13 LAB — SURGICAL PATHOLOGY

## 2015-04-13 NOTE — Telephone Encounter (Signed)
PSA is 4.68. MD would like to keep as scheduled at this time if patient is agreeable. If patient to wishes to see MD sooner then can arrange for him to see MD before christmas.

## 2015-04-13 NOTE — Telephone Encounter (Signed)
Attempted to call patient with PSA results, however, received message that the party I was trying to reach is not accepting calls at this time.

## 2015-04-14 NOTE — Telephone Encounter (Signed)
Spoke with pt's wife and informed her of PSA results. Pt's wife would like to make an appt to see Dr. Oliva Bustard prior to [facility]. Appt arranged for 04/26/15 @ 2:45pm. Pt's wife verbalized understanding.

## 2015-04-18 ENCOUNTER — Telehealth: Payer: Self-pay | Admitting: *Deleted

## 2015-04-18 DIAGNOSIS — C61 Malignant neoplasm of prostate: Secondary | ICD-10-CM

## 2015-04-18 NOTE — Addendum Note (Signed)
Addended by: Telford Nab on: 04/18/2015 09:27 AM   Modules accepted: Orders

## 2015-04-18 NOTE — Telephone Encounter (Signed)
Per Dr Oliva Bustard, stop Preston Wilson, will order bone scan. I spoke with Preston Wilson, she asked that it be scheduled for next week and to call her cell phone since they are out of town [phone]

## 2015-04-18 NOTE — Telephone Encounter (Signed)
Appt is not until 12/20 and no lab appt is scheduled, he runs out if his Xtandi in  A couple of days and she needs to know if she should reorder it, the company has contacted her asking if they are ready for a shipment.

## 2015-04-22 ENCOUNTER — Encounter
Admission: RE | Admit: 2015-04-22 | Discharge: 2015-04-22 | Disposition: A | Discharge status: Home / Self Care | Payer: Medicare Other | Source: Ambulatory Visit | Attending: Oncology | Admitting: Oncology

## 2015-04-22 DIAGNOSIS — C61 Malignant neoplasm of prostate: Secondary | ICD-10-CM | POA: Diagnosis present

## 2015-04-22 MED ORDER — TECHNETIUM TC 99M MEDRONATE IV KIT
23.6100 | PACK | Freq: Once | INTRAVENOUS | Status: AC | PRN
  Administered 2015-04-22: 23.61 via INTRAVENOUS

## 2015-04-26 ENCOUNTER — Encounter: Payer: Self-pay | Admitting: Oncology

## 2015-04-26 ENCOUNTER — Inpatient Hospital Stay (HOSPITAL_BASED_OUTPATIENT_CLINIC_OR_DEPARTMENT_OTHER): Payer: Medicare Other | Admitting: Oncology

## 2015-04-26 VITALS — BP 163/84 | HR 62 | Temp 97.7°F | Resp 18 | Wt 167.8 lb

## 2015-04-26 DIAGNOSIS — R9721 Rising PSA following treatment for malignant neoplasm of prostate: Secondary | ICD-10-CM

## 2015-04-26 DIAGNOSIS — Z9221 Personal history of antineoplastic chemotherapy: Secondary | ICD-10-CM

## 2015-04-26 DIAGNOSIS — Z8781 Personal history of (healed) traumatic fracture: Secondary | ICD-10-CM

## 2015-04-26 DIAGNOSIS — C7951 Secondary malignant neoplasm of bone: Secondary | ICD-10-CM

## 2015-04-26 DIAGNOSIS — C61 Malignant neoplasm of prostate: Secondary | ICD-10-CM

## 2015-04-26 DIAGNOSIS — F17210 Nicotine dependence, cigarettes, uncomplicated: Secondary | ICD-10-CM

## 2015-04-26 DIAGNOSIS — Z79899 Other long term (current) drug therapy: Secondary | ICD-10-CM

## 2015-04-26 DIAGNOSIS — Z9223 Personal history of estrogen therapy: Secondary | ICD-10-CM

## 2015-04-26 DIAGNOSIS — M869 Osteomyelitis, unspecified: Secondary | ICD-10-CM

## 2015-04-26 NOTE — Progress Notes (Signed)
Patient here for results today. 

## 2015-04-26 NOTE — Progress Notes (Signed)
Susan Moore @ [facility] [phone]  Fax:([phone] [place] OB: Feb 14, 1936  MR#: [id number]  [id number]  Patient Care Team: Lenard Simmer, MD as PCP - General (Endocrinology)  CHIEF COMPLAINT:  Chief Complaint  Patient presents with  . Prostate Cancer   Oncology History   1. Carcinoma of prostate diagnosis in 2006.  Had received radiation therapy, external beam to prostate and pelvic lymph nodes.  Had Gleason 9 (4+5.)  PainBaseline PSA was 13. 2. Received Lupron injection after her radiation therapy also.  PSA was going up.  Last year or so received Lupron injection in September but did not have any followup.   3. Recently (this August, 2012) PSA was found to be 17. Patient was started on Lupron from September of 2012. 4. Patient has progressive disease by PSA criteria (October, 2013) 5. Patient was started on Zytiga and prednisone in November of 2013. 6.traumatic hip fracture ((right femur) status post internally fixed ,no evidence of metastatic disease(May of 2014) 7.bone scan in July of 2014 at outside institutions shows metastases to left scapula and left 11th rib/.. 8.progressive On ZYTIGA by PSA criteria.  Will start patient on [place], September of2015   9.progressive disease on XTANDI by tumor markers and bone scan (December, 2016 [name] has been discontinued    Oncology Flowsheet 11/24/2014 02/16/2015  leuprolide (LUPRON) IM 22.5 mg 22.5 mg    INTERVAL HISTORY: 79 year old gentleman was a chronic smoker and is stage IV carcinoma prostate presently onXTANDI and on Lupron.  Patient's appetite is getting better.  Patient continues to smoke.  No difficulty passing urine.  Tolerating treatment very well.  Here for further follow-up and treatment consideration He  is tolerating treatment very well.  No rash.  No abdominal pain.  No nausea.  Complains of hot flashes Stage IV carcinoma prostate His PSA is rising is 4.62.  Almost double  in last few weeks Bone scan shows progressive disease in right scapula and cervical spine He should not is here for further follow-up and treatment consideration   REVIEW OF SYSTEMS:   GENERAL:  Feels good.  Active.  No fevers, sweats or weight loss. PERFORMANCE STATUS (ECOG):  01 HEENT:  No visual changes, runny nose, sore throat, mouth sores or tenderness. Lungs: No shortness of breath or cough.  No hemoptysis. Cardiac:  No chest pain, palpitations, orthopnea, or PND. GI:  No nausea, vomiting, diarrhea, constipation, melena or hematochezia. GU:  No urgency, frequency, dysuria, or hematuria. Musculoskeletal:  No back pain.  No joint pain.  No muscle tenderness. Extremities:  No pain or swelling. Skin:  No rashes or skin changes. Neuro:  No headache, numbness or weakness, balance or coordination issues. Endocrine:  No diabetes, thyroid issues, hot flashes or night sweats. Psych:  No mood changes, depression or anxiety. Pain:  No focal pain. Review of systems:  All other systems reviewed and found to be negative. As per HPI. Otherwise, a complete review of systems is negatve.  PAST MEDICAL HISTORY: Past Medical History  Diagnosis Date  . Prostate cancer ([place])   . Osteomyelitis ([place])   . Hypocalcemia   . Hyperlipidemia   . Heart murmur   . SOB (shortness of breath)     PAST SURGICAL HISTORY: Past Surgical History  Procedure Laterality Date  . Excisional hemorrhoidectomy    . Tonsillectomy    . Colonoscopy with propofol N/A 04/12/2015    Procedure: COLONOSCOPY WITH PROPOFOL;  Surgeon: Lollie Sails, MD;  Location: [facility] ENDOSCOPY;  Service: Endoscopy;  Laterality: N/A;    FAMILY HISTORY  No Known Allergies:   Significant History/PMH:   Hypocalcemia:    Osteomyelitis:    Prostate Cancer:    Right Pinning of Intertrochanteric Femur Fracture  Right: 01-Sep-2012   Hemorrhoidectomy:    Tonsillectomy:   Preventive Screening:  Has patient had any of the following  test? Colonscopy  Prostate Exam   Last Colonoscopy: 2011   Last Prostate Exam: August 2012   Smoking History: Smoking History 0.5 Packs per day. Smoking Cessation Information Given to Patient .  PFSH: Family History: No family history of colorectal cancer, breast cancer, or ovarian cancer.  Comments: Patient does not smoke does not drink  Additional Past Medical and Surgical History: Hyper cholesteremia  No other significant past medical history        ADVANCED DIRECTIVES:    HEALTH MAINTENANCE: Social History  Substance Use Topics  . Smoking status: Current Every Day Smoker  . Smokeless tobacco: Never Used  . Alcohol Use: No      No Known Allergies  Current Outpatient Prescriptions  Medication Sig Dispense Refill  . ADVAIR DISKUS 100-50 MCG/DOSE AEPB inhale 1 dose by mouth twice a day  0  . calcium-vitamin D (OSCAL WITH D) 500-200 MG-UNIT per tablet Take 1 tablet by mouth.    . calcium-vitamin D 250-100 MG-UNIT per tablet Take 1 tablet by mouth 2 (two) times daily.    . Multiple Vitamin (MULTIVITAMIN) tablet Take 1 tablet by mouth daily.    . pravastatin (PRAVACHOL) 20 MG tablet Take 20 mg by mouth daily.    . Vitamin D, Ergocalciferol, (DRISDOL) 50000 UNITS CAPS capsule Take 50,000 Units by mouth once a week.  0   No current facility-administered medications for this visit.    OBJECTIVE:  Filed Vitals:   04/26/15 1524  BP: 163/84  Pulse: 62  Temp: 97.7 F (36.5 C)  Resp: 18     Body mass index is 22.75 kg/(m^2).    ECOG FS:1 - Symptomatic but completely ambulatory  PHYSICAL EXAM: GENERAL:  Well developed, well nourished, sitting comfortably in the exam room in no acute distress. MENTAL STATUS:  Alert and oriented to person, place and time. HEAD:   Normocephalic, atraumatic, face symmetric, no Cushingoid features. EYES:  .  Pupils equal round and reactive to light and accomodation.  No conjunctivitis or scleral icterus. ENT:  Oropharynx clear without  lesion.  Tongue normal. Mucous membranes moist.  RESPIRATORY:  Clear to auscultation without rales, wheezes or rhonchi. CARDIOVASCULAR:  Regular rate and rhythm without murmur, rub or gallop.  ABDOMEN:  Soft, non-tender, with active bowel sounds, and no hepatosplenomegaly.  No masses. BACK:  No CVA tenderness.  No tenderness on percussion of the back or rib cage. SKIN:  No rashes, ulcers or lesions. EXTREMITIES: No edema, no skin discoloration or tenderness.  No palpable cords. LYMPH NODES: No palpable cervical, supraclavicular, axillary or inguinal adenopathy  NEUROLOGICAL: Unremarkable. PSYCH:  Appropriate.   LAB RESULTS:  Component     Latest Ref Rng 04/26/2014 07/29/2014 08/25/2014 09/22/2014 10/27/2014  PSA     0.00 - 4.00 ng/mL 1.0 1.6 2.0 1.99 1.79   Component     Latest Ref Rng 11/24/2014 12/22/2014 01/19/2015 02/16/2015  PSA     0.00 - 4.00 ng/mL 2.12 2.49 2.60 2.92      Lab Results  Component Value Date   PSA 4.68* 04/11/2015        ASSESSMENT: 79 year old  gentleman with stage IV carcinoma prostate.  Presently onXTANDI and Lupron. Rising PSA.  (Almost double in last 4 weeks)Will  Bone scan shows progressive disease in relapse Cervical spine Reviewed bone scan with the patient and family reviewed bone scan independently. Wife is extremely apprehensive Discontinue XTANDI Complete restaging with CT scan of chest abdomen pelvis  If there is no other disease in the lymph node or visceral organs likely liver or lung then consider possibility of xofigo treatment and that has been discussed with the patient and family. Procedure required to consider [place] Pupa has been discussed with the patient. We will also try to get approval for XGEVA  No matching staging information was found for the patient.  Forest Gleason, MD   04/26/2015 4:12 PM

## 2015-05-23 ENCOUNTER — Ambulatory Visit
Admission: RE | Admit: 2015-05-23 | Discharge: 2015-05-23 | Disposition: A | Discharge status: Home / Self Care | Payer: Medicare Other | Source: Ambulatory Visit | Attending: Oncology | Admitting: Oncology

## 2015-05-23 ENCOUNTER — Inpatient Hospital Stay: Payer: Medicare Other

## 2015-05-23 DIAGNOSIS — Z79899 Other long term (current) drug therapy: Secondary | ICD-10-CM | POA: Insufficient documentation

## 2015-05-23 DIAGNOSIS — Z923 Personal history of irradiation: Secondary | ICD-10-CM | POA: Diagnosis not present

## 2015-05-23 DIAGNOSIS — R011 Cardiac murmur, unspecified: Secondary | ICD-10-CM | POA: Diagnosis not present

## 2015-05-23 DIAGNOSIS — Z79818 Long term (current) use of other agents affecting estrogen receptors and estrogen levels: Secondary | ICD-10-CM | POA: Insufficient documentation

## 2015-05-23 DIAGNOSIS — J45909 Unspecified asthma, uncomplicated: Secondary | ICD-10-CM | POA: Insufficient documentation

## 2015-05-23 DIAGNOSIS — E785 Hyperlipidemia, unspecified: Secondary | ICD-10-CM | POA: Diagnosis not present

## 2015-05-23 DIAGNOSIS — Z8781 Personal history of (healed) traumatic fracture: Secondary | ICD-10-CM | POA: Diagnosis not present

## 2015-05-23 DIAGNOSIS — I700 Atherosclerosis of aorta: Secondary | ICD-10-CM | POA: Insufficient documentation

## 2015-05-23 DIAGNOSIS — M899 Disorder of bone, unspecified: Secondary | ICD-10-CM | POA: Diagnosis not present

## 2015-05-23 DIAGNOSIS — C61 Malignant neoplasm of prostate: Secondary | ICD-10-CM

## 2015-05-23 DIAGNOSIS — F17210 Nicotine dependence, cigarettes, uncomplicated: Secondary | ICD-10-CM | POA: Insufficient documentation

## 2015-05-23 DIAGNOSIS — M869 Osteomyelitis, unspecified: Secondary | ICD-10-CM | POA: Diagnosis not present

## 2015-05-23 DIAGNOSIS — R918 Other nonspecific abnormal finding of lung field: Secondary | ICD-10-CM | POA: Insufficient documentation

## 2015-05-23 DIAGNOSIS — R7989 Other specified abnormal findings of blood chemistry: Secondary | ICD-10-CM | POA: Insufficient documentation

## 2015-05-23 DIAGNOSIS — R0602 Shortness of breath: Secondary | ICD-10-CM | POA: Diagnosis not present

## 2015-05-23 DIAGNOSIS — C7951 Secondary malignant neoplasm of bone: Secondary | ICD-10-CM | POA: Diagnosis not present

## 2015-05-23 HISTORY — DX: Unspecified asthma, uncomplicated: J45.909

## 2015-05-23 LAB — CBC WITH DIFFERENTIAL/PLATELET
BASOS PCT: 1 %
Basophils Absolute: 0 10*3/uL (ref 0–0.1)
Eosinophils Absolute: 0.1 10*3/uL (ref 0–0.7)
Eosinophils Relative: 1 %
HEMATOCRIT: 38.5 % — AB (ref 40.0–52.0)
HEMOGLOBIN: 13.1 g/dL (ref 13.0–18.0)
LYMPHS ABS: 1.7 10*3/uL (ref 1.0–3.6)
LYMPHS PCT: 30 %
MCH: 31.1 pg (ref 26.0–34.0)
MCHC: 33.9 g/dL (ref 32.0–36.0)
MCV: 91.7 fL (ref 80.0–100.0)
MONOS PCT: 7 %
Monocytes Absolute: 0.4 10*3/uL (ref 0.2–1.0)
NEUTROS ABS: 3.5 10*3/uL (ref 1.4–6.5)
NEUTROS PCT: 61 %
Platelets: 209 10*3/uL (ref 150–440)
RBC: 4.2 MIL/uL — ABNORMAL LOW (ref 4.40–5.90)
RDW: 13.6 % (ref 11.5–14.5)
WBC: 5.8 10*3/uL (ref 3.8–10.6)

## 2015-05-23 LAB — COMPREHENSIVE METABOLIC PANEL
ALBUMIN: 4.3 g/dL (ref 3.5–5.0)
ALK PHOS: 81 U/L (ref 38–126)
ALT: 15 U/L — ABNORMAL LOW (ref 17–63)
ANION GAP: 9 (ref 5–15)
AST: 16 U/L (ref 15–41)
BILIRUBIN TOTAL: 0.7 mg/dL (ref 0.3–1.2)
BUN: 11 mg/dL (ref 6–20)
CALCIUM: 9.1 mg/dL (ref 8.9–10.3)
CO2: 25 mmol/L (ref 22–32)
Chloride: 98 mmol/L — ABNORMAL LOW (ref 101–111)
Creatinine, Ser: 0.57 mg/dL — ABNORMAL LOW (ref 0.61–1.24)
GFR calc Af Amer: >60 mL/min (ref >60)
GFR calc non Af Amer: >60 mL/min (ref >60)
GLUCOSE: 97 mg/dL (ref 65–99)
Potassium: 4.2 mmol/L (ref 3.5–5.1)
Sodium: 132 mmol/L — ABNORMAL LOW (ref 135–145)
TOTAL PROTEIN: 7.6 g/dL (ref 6.5–8.1)

## 2015-05-23 MED ORDER — IOHEXOL 300 MG/ML  SOLN
100.0000 mL | Freq: Once | INTRAMUSCULAR | Status: AC | PRN
  Administered 2015-05-23: 100 mL via INTRAVENOUS

## 2015-05-24 ENCOUNTER — Inpatient Hospital Stay (HOSPITAL_BASED_OUTPATIENT_CLINIC_OR_DEPARTMENT_OTHER): Payer: Medicare Other | Admitting: Oncology

## 2015-05-24 ENCOUNTER — Ambulatory Visit: Admission: RE | Admit: 2015-05-24 | Payer: Medicare Other | Source: Ambulatory Visit | Admitting: Radiation Oncology

## 2015-05-24 ENCOUNTER — Inpatient Hospital Stay: Payer: Medicare Other

## 2015-05-24 ENCOUNTER — Encounter: Payer: Self-pay | Admitting: Oncology

## 2015-05-24 ENCOUNTER — Ambulatory Visit: Payer: Medicare Other

## 2015-05-24 ENCOUNTER — Inpatient Hospital Stay: Payer: Medicare Other | Attending: Oncology | Admitting: Oncology

## 2015-05-24 VITALS — BP 137/78 | HR 85 | Temp 97.5°F | Resp 18 | Wt 169.3 lb

## 2015-05-24 DIAGNOSIS — Z8781 Personal history of (healed) traumatic fracture: Secondary | ICD-10-CM

## 2015-05-24 DIAGNOSIS — C61 Malignant neoplasm of prostate: Secondary | ICD-10-CM

## 2015-05-24 DIAGNOSIS — J45909 Unspecified asthma, uncomplicated: Secondary | ICD-10-CM

## 2015-05-24 DIAGNOSIS — Z923 Personal history of irradiation: Secondary | ICD-10-CM

## 2015-05-24 DIAGNOSIS — R011 Cardiac murmur, unspecified: Secondary | ICD-10-CM

## 2015-05-24 DIAGNOSIS — R7989 Other specified abnormal findings of blood chemistry: Secondary | ICD-10-CM

## 2015-05-24 DIAGNOSIS — F17210 Nicotine dependence, cigarettes, uncomplicated: Secondary | ICD-10-CM

## 2015-05-24 DIAGNOSIS — C7951 Secondary malignant neoplasm of bone: Secondary | ICD-10-CM | POA: Diagnosis not present

## 2015-05-24 DIAGNOSIS — Z79818 Long term (current) use of other agents affecting estrogen receptors and estrogen levels: Secondary | ICD-10-CM | POA: Diagnosis not present

## 2015-05-24 DIAGNOSIS — R0602 Shortness of breath: Secondary | ICD-10-CM

## 2015-05-24 DIAGNOSIS — M869 Osteomyelitis, unspecified: Secondary | ICD-10-CM

## 2015-05-24 DIAGNOSIS — Z79899 Other long term (current) drug therapy: Secondary | ICD-10-CM | POA: Diagnosis not present

## 2015-05-24 DIAGNOSIS — R918 Other nonspecific abnormal finding of lung field: Secondary | ICD-10-CM

## 2015-05-24 DIAGNOSIS — E785 Hyperlipidemia, unspecified: Secondary | ICD-10-CM

## 2015-05-24 LAB — PSA: PSA: 7.03 ng/mL — AB (ref 0.00–4.00)

## 2015-05-24 MED ORDER — DENOSUMAB 120 MG/1.7ML ~~LOC~~ SOLN
120.0000 mg | Freq: Once | SUBCUTANEOUS | Status: AC
  Administered 2015-05-24: 120 mg via SUBCUTANEOUS
  Filled 2015-05-24: qty 1.7

## 2015-05-24 MED ORDER — LEUPROLIDE ACETATE (3 MONTH) 22.5 MG IM KIT
22.5000 mg | PACK | Freq: Once | INTRAMUSCULAR | Status: AC
  Administered 2015-05-24: 22.5 mg via INTRAMUSCULAR
  Filled 2015-05-24: qty 22.5

## 2015-05-24 NOTE — Progress Notes (Signed)
[place] @ [facility] Telephone:([phone]) [phone]  Fax:(336) [place] OB: [birth year]  MR#: [id number]  [id number]  Patient Care Team: Lenard Simmer, MD as PCP - General (Endocrinology)  CHIEF COMPLAINT:  Chief Complaint  Patient presents with  . Prostate Cancer   Oncology History   1. Carcinoma of prostate diagnosis in 2006.  Had received radiation therapy, external beam to prostate and pelvic lymph nodes.  Had Gleason 9 (4+5.)  PainBaseline PSA was 13. 2. Received Lupron injection after her radiation therapy also.  PSA was going up.  Last year or so received Lupron injection in September but did not have any followup.   3. Recently (this August, 2012) PSA was found to be 17. Patient was started on Lupron from September of 2012. 4. Patient has progressive disease by PSA criteria (October, 2013) 5. Patient was started on Zytiga and prednisone in November of 2013. 6.traumatic hip fracture ((right femur) status post internally fixed ,no evidence of metastatic disease(May of 2014) 7.bone scan in July of 2014 at outside institutions shows metastases to left scapula and left 11th rib/.. 8.progressive On ZYTIGA by PSA criteria.  Will start patient on [place], September of2015   9.progressive disease on XTANDI by tumor markers and bone scan (December, 2016 [name] has been discontinued    Oncology Flowsheet 11/24/2014 02/16/2015  leuprolide (LUPRON) IM 22.5 mg 22.5 mg    INTERVAL HISTORY: 80 year old gentleman was a chronic smoker and is stage IV carcinoma prostate presently onXTANDI and on Lupron.  Patient's appetite is getting better.  Patient continues to smoke.  No difficulty passing urine.  Tolerating treatment very well.  Here for further follow-up and treatment consideration He  is tolerating treatment very well.  No rash.  No abdominal pain.  No nausea.  Complains of hot flashes Stage IV carcinoma prostate Patient is here for further follow-up had  a CT scan of chest and abdomen and a bone scan. The scan of chest and abdomen does not show any evidence of retroperitoneal lymphadenopathy.  There are only 2 areas in the bone shows persistent increased uptake but there is no evidence of bony pain at present time. Patient has a stable pulmonary nodules .Marland Kitchen And patient had a history of smoking.   REVIEW OF SYSTEMS:   GENERAL:  Feels good.  Active.  No fevers, sweats or weight loss. PERFORMANCE STATUS (ECOG):  01 HEENT:  No visual changes, runny nose, sore throat, mouth sores or tenderness. Lungs: No shortness of breath or cough.  No hemoptysis. Cardiac:  No chest pain, palpitations, orthopnea, or PND. GI:  No nausea, vomiting, diarrhea, constipation, melena or hematochezia. GU:  No urgency, frequency, dysuria, or hematuria. Musculoskeletal:  No back pain.  No joint pain.  No muscle tenderness. Extremities:  No pain or swelling. Skin:  No rashes or skin changes. Neuro:  No headache, numbness or weakness, balance or coordination issues. Endocrine:  No diabetes, thyroid issues, hot flashes or night sweats. Psych:  No mood changes, depression or anxiety. Pain:  No focal pain. Review of systems:  All other systems reviewed and found to be negative. As per HPI. Otherwise, a complete review of systems is negatve.  PAST MEDICAL HISTORY: Past Medical History  Diagnosis Date  . Prostate cancer ([place])   . Osteomyelitis ([place])   . Hypocalcemia   . Hyperlipidemia   . Heart murmur   . SOB (shortness of breath)   . Asthma     PAST SURGICAL HISTORY:  Past Surgical History  Procedure Laterality Date  . Excisional hemorrhoidectomy    . Tonsillectomy    . Colonoscopy with propofol N/A 04/12/2015    Procedure: COLONOSCOPY WITH PROPOFOL;  Surgeon: Lollie Sails, MD;  Location[facility] END[facility]COPY;  Service: Endoscopy;  Laterality: N/A;    FAMILY HISTORY  No Known Allergies:   Significant History/PMH:   Hypocalcemia:    Osteomyelitis:      Prostate Cancer:    Right Pinning of Intertrochanteric Femur Fracture  Right: 01-Sep-2012   Hemorrhoidectomy:    Tonsillectomy:   Preventive Screening:  Has patient had any of the following test? Colonscopy  Prostate Exam   Last Colonoscopy: 2011   Last Prostate Exam: August 2012   Smoking History: Smoking History 0.5 Packs per day. Smoking Cessation Information Given to Patient .  PFSH: Family History: No family history of colorectal cancer, breast cancer, or ovarian cancer.  Comments: Patient does not smoke does not drink  Additional Past Medical and Surgical History: Hyper cholesteremia  No other significant past medical history        ADVANCED DIRECTIVES:    HEALTH MAINTENANCE: Social History  Substance Use Topics  . Smoking status: Current Every Day Smoker  . Smokeless tobacco: Never Used  . Alcohol Use: No      No Known Allergies  Current Outpatient Prescriptions  Medication Sig Dispense Refill  . ADVAIR DISKUS 100-50 MCG/DOSE AEPB inhale 1 dose by mouth twice a day  0  . calcium-vitamin D (OSCAL WITH D) 500-200 MG-UNIT per tablet Take 1 tablet by mouth.    . calcium-vitamin D 250-100 MG-UNIT per tablet Take 1 tablet by mouth 2 (two) times daily.    . Multiple Vitamin (MULTIVITAMIN) tablet Take 1 tablet by mouth daily.    . pravastatin (PRAVACHOL) 20 MG tablet Take 20 mg by mouth daily.    . Vitamin D, Ergocalciferol, (DRISDOL) 50000 UNITS CAPS capsule Take 50,000 Units by mouth once a week.  0   No current facility-administered medications for this visit.    OBJECTIVE:  Filed Vitals:   05/24/15 1341  BP: 137/78  Pulse: 85  Temp: 97.5 F (36.4 C)  Resp: 18     Body mass index is 22.96 kg/(m^2).    ECOG FS:1 - Symptomatic but completely ambulatory  PHYSICAL EXAM: GENERAL:  Well developed, well nourished, sitting comfortably in the exam room in no acute distress. MENTAL STATUS:  Alert and oriented to person, place and time. HEAD:    Normocephalic, atraumatic, face symmetric, no Cushingoid features. EYES:  .  Pupils equal round and reactive to light and accomodation.  No conjunctivitis or scleral icterus. ENT:  Oropharynx clear without lesion.  Tongue normal. Mucous membranes moist.  RESPIRATORY:  Clear to auscultation without rales, wheezes or rhonchi. CARDIOVASCULAR:  Regular rate and rhythm without murmur, rub or gallop.  ABDOMEN:  Soft, non-tender, with active bowel sounds, and no hepatosplenomegaly.  No masses. BACK:  No CVA tenderness.  No tenderness on percussion of the back or rib cage. SKIN:  No rashes, ulcers or lesions. EXTREMITIES: No edema, no skin discoloration or tenderness.  No palpable cords. LYMPH NODES: No palpable cervical, supraclavicular, axillary or inguinal adenopathy  NEUROLOGICAL: Unremarkable. PSYCH:  Appropriate.   LAB RESULTS:       Lab Results  Component Value Date   PSA 4.68* 04/11/2015        ASSESSMENT: 80 year old gentleman with stage IV carcinoma prostate.  Presently onXTANDI and Lupron. Rising PSA.  (Almost double  in last 4 weeks)Will  CT scan of chest and abdomen has been in reviewed independently and reviewed with the patient. Start patient on next eval we will hold off  XOFIGO.  As patient does not have any painful bony metastases.. Continue Lupron reevaluate patient in 4 weeks possibility of chemotherapy also being considered.  A PSA Rising. Total duration of visit was 25 minutes.  50% or more time was spent in counseling patient and family regarding prognosis and options of treatment and available resources  Forest Gleason, MD   05/24/2015 2:05 PM

## 2015-05-24 NOTE — Progress Notes (Signed)
Patient here for CT results.  

## 2015-05-25 ENCOUNTER — Encounter: Payer: Self-pay | Admitting: Oncology

## 2015-05-25 NOTE — Progress Notes (Signed)
Patient already has been seen yesterday

## 2015-06-01 ENCOUNTER — Ambulatory Visit: Payer: Self-pay

## 2015-06-01 ENCOUNTER — Other Ambulatory Visit: Payer: Self-pay

## 2015-06-01 ENCOUNTER — Ambulatory Visit: Payer: Self-pay | Admitting: Oncology

## 2015-06-02 ENCOUNTER — Other Ambulatory Visit: Payer: Self-pay

## 2015-06-02 ENCOUNTER — Ambulatory Visit: Payer: Self-pay | Admitting: Oncology

## 2015-06-02 ENCOUNTER — Ambulatory Visit: Payer: Self-pay

## 2015-06-21 ENCOUNTER — Inpatient Hospital Stay: Payer: Medicare Other

## 2015-06-21 ENCOUNTER — Inpatient Hospital Stay (HOSPITAL_BASED_OUTPATIENT_CLINIC_OR_DEPARTMENT_OTHER): Payer: Medicare Other | Admitting: Oncology

## 2015-06-21 ENCOUNTER — Encounter: Payer: Self-pay | Admitting: Oncology

## 2015-06-21 ENCOUNTER — Inpatient Hospital Stay: Payer: Medicare Other | Attending: Oncology

## 2015-06-21 VITALS — BP 147/74 | HR 91 | Temp 97.8°F | Resp 18 | Wt 172.4 lb

## 2015-06-21 DIAGNOSIS — F17210 Nicotine dependence, cigarettes, uncomplicated: Secondary | ICD-10-CM | POA: Insufficient documentation

## 2015-06-21 DIAGNOSIS — Z9221 Personal history of antineoplastic chemotherapy: Secondary | ICD-10-CM | POA: Diagnosis not present

## 2015-06-21 DIAGNOSIS — J45909 Unspecified asthma, uncomplicated: Secondary | ICD-10-CM | POA: Diagnosis not present

## 2015-06-21 DIAGNOSIS — C61 Malignant neoplasm of prostate: Secondary | ICD-10-CM

## 2015-06-21 DIAGNOSIS — R011 Cardiac murmur, unspecified: Secondary | ICD-10-CM

## 2015-06-21 DIAGNOSIS — C7951 Secondary malignant neoplasm of bone: Secondary | ICD-10-CM | POA: Diagnosis not present

## 2015-06-21 DIAGNOSIS — M869 Osteomyelitis, unspecified: Secondary | ICD-10-CM | POA: Diagnosis not present

## 2015-06-21 DIAGNOSIS — R0602 Shortness of breath: Secondary | ICD-10-CM | POA: Diagnosis not present

## 2015-06-21 DIAGNOSIS — Z79899 Other long term (current) drug therapy: Secondary | ICD-10-CM

## 2015-06-21 DIAGNOSIS — R918 Other nonspecific abnormal finding of lung field: Secondary | ICD-10-CM

## 2015-06-21 DIAGNOSIS — E785 Hyperlipidemia, unspecified: Secondary | ICD-10-CM

## 2015-06-21 LAB — COMPREHENSIVE METABOLIC PANEL
ALBUMIN: 4.4 g/dL (ref 3.5–5.0)
ALT: 20 U/L (ref 17–63)
ANION GAP: 9 (ref 5–15)
AST: 18 U/L (ref 15–41)
Alkaline Phosphatase: 78 U/L (ref 38–126)
BUN: 15 mg/dL (ref 6–20)
CHLORIDE: 102 mmol/L (ref 101–111)
CO2: 25 mmol/L (ref 22–32)
Calcium: 9.3 mg/dL (ref 8.9–10.3)
Creatinine, Ser: 0.59 mg/dL — ABNORMAL LOW (ref 0.61–1.24)
GFR calc Af Amer: >60 mL/min (ref >60)
GFR calc non Af Amer: >60 mL/min (ref >60)
GLUCOSE: 137 mg/dL — AB (ref 65–99)
POTASSIUM: 4 mmol/L (ref 3.5–5.1)
SODIUM: 136 mmol/L (ref 135–145)
TOTAL PROTEIN: 7.1 g/dL (ref 6.5–8.1)
Total Bilirubin: 0.6 mg/dL (ref 0.3–1.2)

## 2015-06-21 LAB — CBC WITH DIFFERENTIAL/PLATELET
BASOS ABS: 0 10*3/uL (ref 0–0.1)
Basophils Relative: 1 %
EOS PCT: 1 %
Eosinophils Absolute: 0.1 10*3/uL (ref 0–0.7)
HEMATOCRIT: 35.6 % — AB (ref 40.0–52.0)
Hemoglobin: 12.4 g/dL — ABNORMAL LOW (ref 13.0–18.0)
LYMPHS ABS: 2.4 10*3/uL (ref 1.0–3.6)
Lymphocytes Relative: 36 %
MCH: 31.7 pg (ref 26.0–34.0)
MCHC: 34.7 g/dL (ref 32.0–36.0)
MCV: 91.3 fL (ref 80.0–100.0)
MONO ABS: 0.4 10*3/uL (ref 0.2–1.0)
Monocytes Relative: 6 %
NEUTROS ABS: 3.8 10*3/uL (ref 1.4–6.5)
NEUTROS PCT: 56 %
PLATELETS: 232 10*3/uL (ref 150–440)
RBC: 3.9 MIL/uL — ABNORMAL LOW (ref 4.40–5.90)
RDW: 13.6 % (ref 11.5–14.5)
WBC: 6.8 10*3/uL (ref 3.8–10.6)

## 2015-06-21 MED ORDER — DENOSUMAB 120 MG/1.7ML ~~LOC~~ SOLN
120.0000 mg | Freq: Once | SUBCUTANEOUS | Status: AC
  Administered 2015-06-21: 120 mg via SUBCUTANEOUS
  Filled 2015-06-21: qty 1.7

## 2015-06-21 NOTE — Progress Notes (Signed)
Patient already has been seen yesterday  [place] @ [facility] [phone]  Fax:(336) [place] OB: 04-05-1936  MR#: [id number]  [id number]  Patient Care Team: Lenard Simmer, MD as PCP - General (Endocrinology)  CHIEF COMPLAINT:  Chief Complaint  Patient presents with  . Prostate Cancer   Oncology History   1. Carcinoma of prostate diagnosis in 2006.  Had received radiation therapy, external beam to prostate and pelvic lymph nodes.  Had Gleason 9 (4+5.)  PainBaseline PSA was 13. 2. Received Lupron injection after her radiation therapy also.  PSA was going up.  Last year or so received Lupron injection in September but did not have any followup.   3. Recently (this August, 2012) PSA was found to be 17. Patient was started on Lupron from September of 2012. 4. Patient has progressive disease by PSA criteria (October, 2013) 5. Patient was started on Zytiga and prednisone in November of 2013. 6.traumatic hip fracture ((right femur) status post internally fixed ,no evidence of metastatic disease(May of 2014) 7.bone scan in July of 2014 at outside institutions shows metastases to left scapula and left 11th rib/.. 8.progressive On ZYTIGA by PSA criteria.  Will start patient on [place], September of2015   9.progressive disease on XTANDI by tumor markers and bone scan (December, 2016 [name] has been discontinued    Oncology Flowsheet 11/24/2014 02/16/2015 05/24/2015  denosumab (XGEVA) [place] - - 120 mg  leuprolide (LUPRON) IM 22.5 mg 22.5 mg 22.5 mg    INTERVAL HISTORY: 80 year old gentleman was a chronic smoker and is stage IV carcinoma prostate presently onXTANDI and on Lupron.  Patient's appetite is getting better.  Patient continues to smoke.  No difficulty passing urine.  Tolerating treatment very well.  Here for further follow-up and treatment consideration He  is tolerating treatment very well.  No rash.  No abdominal pain.  No nausea.   Complains of hot flashes Stage IV carcinoma prostate Patient is here for further follow-up had a CT scan of chest and abdomen and a bone scan. The scan of chest and abdomen does not show any evidence of retroperitoneal lymphadenopathy.  There are only 2 areas in the bone shows persistent increased uptake but there is no evidence of bony pain at present time. Patient has a stable pulmonary nodules .Marland Kitchen And patient had a history of smoking. Patient does not have any bony pain appetite has been stable gaining weight. On XGEVA because of bone metastases.   REVIEW OF SYSTEMS:   GENERAL:  Feels good.  Active.  No fevers, sweats or weight loss. PERFORMANCE STATUS (ECOG):  01 HEENT:  No visual changes, runny nose, sore throat, mouth sores or tenderness. Lungs: No shortness of breath or cough.  No hemoptysis. Cardiac:  No chest pain, palpitations, orthopnea, or PND. GI:  No nausea, vomiting, diarrhea, constipation, melena or hematochezia. GU:  No urgency, frequency, dysuria, or hematuria. Musculoskeletal:  No back pain.  No joint pain.  No muscle tenderness. Extremities:  No pain or swelling. Skin:  No rashes or skin changes. Neuro:  No headache, numbness or weakness, balance or coordination issues. Endocrine:  No diabetes, thyroid issues, hot flashes or night sweats. Psych:  No mood changes, depression or anxiety. Pain:  No focal pain. Review of systems:  All other systems reviewed and found to be negative. As per HPI. Otherwise, a complete review of systems is negatve.  PAST MEDICAL HISTORY: Past Medical History  Diagnosis Date  . Prostate cancer (  [place])   . Osteomyelitis ([place])   . Hypocalcemia   . Hyperlipidemia   . Heart murmur   . SOB (shortness of breath)   . Asthma     PAST SURGICAL HISTORY: Past Surgical History  Procedure Laterality Date  . Excisional hemorrhoidectomy    . Tonsillectomy    . Colonoscopy with propofol N/A 04/12/2015    Procedure: COLONOSCOPY WITH PROPOFOL;   Surgeon: Lollie Sails, MD;  Location: [facility] ENDOSCOPY;  Service: Endoscopy;  Laterality: N/A;    FAMILY HISTORY  No Known Allergies:   Significant History/PMH:   Hypocalcemia:    Osteomyelitis:    Prostate Cancer:    Right Pinning of Intertrochanteric Femur Fracture  Right: 78-Apr-2014   Hemorrhoidectomy:    Tonsillectomy:   Preventive Screening:  Has patient had any of the following test? Colonscopy  Prostate Exam   Last Colonoscopy: 2011   Last Prostate Exam: August 2012   Smoking History: Smoking History 0.5 Packs per day. Smoking Cessation Information Given to Patient .  PFSH: Family History: No family history of colorectal cancer, breast cancer, or ovarian cancer.  Comments: Patient does not smoke does not drink  Additional Past Medical and Surgical History: Hyper cholesteremia  No other significant past medical history        ADVANCED DIRECTIVES:    HEALTH MAINTENANCE: Social History  Substance Use Topics  . Smoking status: Current Every Day Smoker  . Smokeless tobacco: Never Used  . Alcohol Use: No      No Known Allergies  Current Outpatient Prescriptions  Medication Sig Dispense Refill  . ADVAIR DISKUS 100-50 MCG/DOSE AEPB inhale 1 dose by mouth twice a day  0  . calcium-vitamin D (OSCAL WITH D) 500-200 MG-UNIT per tablet Take 1 tablet by mouth.    . calcium-vitamin D 250-100 MG-UNIT per tablet Take 1 tablet by mouth 2 (two) times daily.    . Multiple Vitamin (MULTIVITAMIN) tablet Take 1 tablet by mouth daily.    . pravastatin (PRAVACHOL) 20 MG tablet Take 20 mg by mouth daily.    . Vitamin D, Ergocalciferol, (DRISDOL) 50000 UNITS CAPS capsule Take 50,000 Units by mouth once a week.  0   No current facility-administered medications for this visit.    OBJECTIVE:  Filed Vitals:   06/21/15 1414  BP: 147/74  Pulse: 91  Temp: 97.8 F (36.6 C)  Resp: 18     Body mass index is 23.38 kg/(m^2).    ECOG FS:1 - Symptomatic but completely  ambulatory  PHYSICAL EXAM: GENERAL:  Well developed, well nourished, sitting comfortably in the exam room in no acute distress. MENTAL STATUS:  Alert and oriented to person, place and time. HEAD:   Normocephalic, atraumatic, face symmetric, no Cushingoid features. EYES:  .  Pupils equal round and reactive to light and accomodation.  No conjunctivitis or scleral icterus. ENT:  Oropharynx clear without lesion.  Tongue normal. Mucous membranes moist.  RESPIRATORY:  Clear to auscultation without rales, wheezes or rhonchi. CARDIOVASCULAR:  Regular rate and rhythm without murmur, rub or gallop.  ABDOMEN:  Soft, non-tender, with active bowel sounds, and no hepatosplenomegaly.  No masses. BACK:  No CVA tenderness.  No tenderness on percussion of the back or rib cage. SKIN:  No rashes, ulcers or lesions. EXTREMITIES: No edema, no skin discoloration or tenderness.  No palpable cords. LYMPH NODES: No palpable cervical, supraclavicular, axillary or inguinal adenopathy  NEUROLOGICAL: Unremarkable. PSYCH:  Appropriate.   LAB RESULTS:   Lab results has been  reviewed.   CALCIUM IS 9.3     Lab Results  Component Value Date   PSA 7.03* 05/23/2015        ASSESSMENT: 80 year old gentleman with stage IV carcinoma prostate.  Presently onXTANDI and Lupron. Rising PSA.  (Almost double in last 4 weeks)Will  CT scan of chest and abdomen has been in reviewed independently and reviewed with the patient. Start patient on next eval we will hold off  XOFIGO.  As patient does not have any painful bony metastases.. Proceed with XGEVA The lab data has been reviewed Continue Lupron reevaluate patient in 4 weeks possibility of chemotherapy also being considered.  A PSA Rising.   Forest Gleason, MD   06/21/2015 2:30 PM

## 2015-06-22 LAB — PSA: PSA: 9.99 ng/mL — AB (ref 0.00–4.00)

## 2015-07-19 ENCOUNTER — Inpatient Hospital Stay: Payer: Medicare Other

## 2015-07-19 ENCOUNTER — Inpatient Hospital Stay: Payer: Medicare Other | Attending: Oncology | Admitting: Oncology

## 2015-07-19 ENCOUNTER — Encounter: Payer: Self-pay | Admitting: Oncology

## 2015-07-19 VITALS — BP 136/77 | HR 80 | Temp 96.9°F | Resp 18 | Wt 176.4 lb

## 2015-07-19 DIAGNOSIS — R9721 Rising PSA following treatment for malignant neoplasm of prostate: Secondary | ICD-10-CM | POA: Diagnosis not present

## 2015-07-19 DIAGNOSIS — F17210 Nicotine dependence, cigarettes, uncomplicated: Secondary | ICD-10-CM | POA: Diagnosis not present

## 2015-07-19 DIAGNOSIS — M869 Osteomyelitis, unspecified: Secondary | ICD-10-CM | POA: Insufficient documentation

## 2015-07-19 DIAGNOSIS — Z79899 Other long term (current) drug therapy: Secondary | ICD-10-CM | POA: Diagnosis not present

## 2015-07-19 DIAGNOSIS — J45909 Unspecified asthma, uncomplicated: Secondary | ICD-10-CM | POA: Diagnosis not present

## 2015-07-19 DIAGNOSIS — C7951 Secondary malignant neoplasm of bone: Secondary | ICD-10-CM

## 2015-07-19 DIAGNOSIS — R0602 Shortness of breath: Secondary | ICD-10-CM

## 2015-07-19 DIAGNOSIS — C61 Malignant neoplasm of prostate: Secondary | ICD-10-CM | POA: Insufficient documentation

## 2015-07-19 DIAGNOSIS — E785 Hyperlipidemia, unspecified: Secondary | ICD-10-CM | POA: Diagnosis not present

## 2015-07-19 DIAGNOSIS — Z8781 Personal history of (healed) traumatic fracture: Secondary | ICD-10-CM | POA: Diagnosis not present

## 2015-07-19 DIAGNOSIS — R918 Other nonspecific abnormal finding of lung field: Secondary | ICD-10-CM | POA: Diagnosis not present

## 2015-07-19 LAB — COMPREHENSIVE METABOLIC PANEL
ALT: 16 U/L — AB (ref 17–63)
AST: 22 U/L (ref 15–41)
Albumin: 4.3 g/dL (ref 3.5–5.0)
Alkaline Phosphatase: 60 U/L (ref 38–126)
Anion gap: 6 (ref 5–15)
BUN: 14 mg/dL (ref 6–20)
CHLORIDE: 106 mmol/L (ref 101–111)
CO2: 24 mmol/L (ref 22–32)
CREATININE: 0.66 mg/dL (ref 0.61–1.24)
Calcium: 8.9 mg/dL (ref 8.9–10.3)
GFR calc non Af Amer: >60 mL/min (ref >60)
Glucose, Bld: 109 mg/dL — ABNORMAL HIGH (ref 65–99)
Potassium: 4.1 mmol/L (ref 3.5–5.1)
SODIUM: 136 mmol/L (ref 135–145)
Total Bilirubin: 0.6 mg/dL (ref 0.3–1.2)
Total Protein: 7.2 g/dL (ref 6.5–8.1)

## 2015-07-19 LAB — CBC WITH DIFFERENTIAL/PLATELET
BASOS ABS: 0 10*3/uL (ref 0–0.1)
Basophils Relative: 1 %
EOS ABS: 0.1 10*3/uL (ref 0–0.7)
EOS PCT: 2 %
HCT: 35.9 % — ABNORMAL LOW (ref 40.0–52.0)
Hemoglobin: 12.3 g/dL — ABNORMAL LOW (ref 13.0–18.0)
Lymphocytes Relative: 40 %
Lymphs Abs: 2.1 10*3/uL (ref 1.0–3.6)
MCH: 31.5 pg (ref 26.0–34.0)
MCHC: 34.3 g/dL (ref 32.0–36.0)
MCV: 92 fL (ref 80.0–100.0)
Monocytes Absolute: 0.3 10*3/uL (ref 0.2–1.0)
Monocytes Relative: 6 %
Neutro Abs: 2.7 10*3/uL (ref 1.4–6.5)
Neutrophils Relative %: 51 %
PLATELETS: 185 10*3/uL (ref 150–440)
RBC: 3.9 MIL/uL — AB (ref 4.40–5.90)
RDW: 14 % (ref 11.5–14.5)
WBC: 5.3 10*3/uL (ref 3.8–10.6)

## 2015-07-19 MED ORDER — DENOSUMAB 120 MG/1.7ML ~~LOC~~ SOLN
120.0000 mg | Freq: Once | SUBCUTANEOUS | Status: AC
  Administered 2015-07-19: 120 mg via SUBCUTANEOUS
  Filled 2015-07-19: qty 1.7

## 2015-07-19 NOTE — Progress Notes (Signed)
Patient already has been seen yesterday  [place] @ [facility] [phone]  Fax:(336) [place] OB: Jan 11, 1936  MR#: [id number]  [id number]  Patient Care Team: Lenard Simmer, MD as PCP - General (Endocrinology)  CHIEF COMPLAINT:  Chief Complaint  Patient presents with  . Prostate Cancer   Oncology History   1. Carcinoma of prostate diagnosis in 2006.  Had received radiation therapy, external beam to prostate and pelvic lymph nodes.  Had Gleason 9 (4+5.)  PainBaseline PSA was 13. 2. Received Lupron injection after her radiation therapy also.  PSA was going up.  Last year or so received Lupron injection in September but did not have any followup.   3. Recently (this August, 2012) PSA was found to be 17. Patient was started on Lupron from September of 2012. 4. Patient has progressive disease by PSA criteria (October, 2013) 5. Patient was started on Zytiga and prednisone in November of 2013. 6.traumatic hip fracture ((right femur) status post internally fixed ,no evidence of metastatic disease(May of 2014) 7.bone scan in July of 2014 at outside institutions shows metastases to left scapula and left 11th rib/.. 8.progressive On ZYTIGA by PSA criteria.  Will start patient on [place], September of2015   9.progressive disease on XTANDI by tumor markers and bone scan (December, 2016 [name] has been discontinued Recent is on Lupron and [place] 11/24/2014 02/16/2015 05/24/2015 06/21/2015  denosumab (XGEVA) [place] - - 120 mg 120 mg  leuprolide (LUPRON) IM 22.5 mg 22.5 mg 22.5 mg -    INTERVAL HISTORY: 80 year old gentleman was a chronic smoker and is stage IV carcinoma prostate presently onXTANDI and on Lupron.  Patient's appetite is getting better.  Patient continues to smoke.  No difficulty passing urine.  Tolerating treatment very well.  Here for further follow-up and treatment consideration He  is tolerating treatment very  well.  No rash.  No abdominal pain.  No nausea.  Complains of hot flashes Stage IV carcinoma prostate Patient is here for further follow-up had a CT scan of chest and abdomen and a bone scan. The scan of chest and abdomen does not show any evidence of retroperitoneal lymphadenopathy.  There are only 2 areas in the bone shows persistent increased uptake but there is no evidence of bony pain at present time. Patient has a stable pulmonary nodules .Marland Kitchen And patient had a history of smoking. Patient does not have any bony pain appetite has been stable gaining weight. On XGEVA because of bone metastases.  Patient is quite concerned about rising PSA which is 9.9 last month.  No bony pains.  Appetite has been stable. Patient continues to smoke at pulmonary nodule on CT scan which is being observed REVIEW OF SYSTEMS:   GENERAL:  Feels good.  Active.  No fevers, sweats or weight loss. PERFORMANCE STATUS (ECOG):  01 HEENT:  No visual changes, runny nose, sore throat, mouth sores or tenderness. Lungs: No shortness of breath or cough.  No hemoptysis. Cardiac:  No chest pain, palpitations, orthopnea, or PND. GI:  No nausea, vomiting, diarrhea, constipation, melena or hematochezia. GU:  No urgency, frequency, dysuria, or hematuria. Musculoskeletal:  No back pain.  No joint pain.  No muscle tenderness. Extremities:  No pain or swelling. Skin:  No rashes or skin changes. Neuro:  No headache, numbness or weakness, balance or coordination issues. Endocrine:  No diabetes, thyroid issues, hot flashes or night sweats. Psych:  No mood changes, depression or  anxiety. Pain:  No focal pain. Review of systems:  All other systems reviewed and found to be negative. As per HPI. Otherwise, a complete review of systems is negatve.  PAST MEDICAL HISTORY: Past Medical History  Diagnosis Date  . Prostate cancer ([place])   . Osteomyelitis ([place])   . Hypocalcemia   . Hyperlipidemia   . Heart murmur   . SOB (shortness of  breath)   . Asthma     PAST SURGICAL HISTORY: Past Surgical History  Procedure Laterality Date  . Excisional hemorrhoidectomy    . Tonsillectomy    . Colonoscopy with propofol N/A 04/12/2015    Procedure: COLONOSCOPY WITH PROPOFOL;  Surgeon: Lollie Sails, MD;  Location: [facility] ENDOSCOPY;  Service: Endoscopy;  Laterality: N/A;    FAMILY HISTORY  No Known Allergies:   Significant History/PMH:   Hypocalcemia:    Osteomyelitis:    Prostate Cancer:    Right Pinning of Intertrochanteric Femur Fracture  Right: 01-Sep-2012   Hemorrhoidectomy:    Tonsillectomy:   Preventive Screening:  Has patient had any of the following test? Colonscopy  Prostate Exam   Last Colonoscopy: 2011   Last Prostate Exam: August 2012   Smoking History: Smoking History 0.5 Packs per day. Smoking Cessation Information Given to Patient .  PFSH: Family History: No family history of colorectal cancer, breast cancer, or ovarian cancer.  Comments: Patient does not smoke does not drink  Additional Past Medical and Surgical History: Hyper cholesteremia  No other significant past medical history        ADVANCED DIRECTIVES:    HEALTH MAINTENANCE: Social History  Substance Use Topics  . Smoking status: Current Every Day Smoker  . Smokeless tobacco: Never Used  . Alcohol Use: No      No Known Allergies  Current Outpatient Prescriptions  Medication Sig Dispense Refill  . ADVAIR DISKUS 100-50 MCG/DOSE AEPB inhale 1 dose by mouth twice a day  0  . calcium-vitamin D (OSCAL WITH D) 500-200 MG-UNIT per tablet Take 1 tablet by mouth.    . calcium-vitamin D 250-100 MG-UNIT per tablet Take 1 tablet by mouth 2 (two) times daily.    . Multiple Vitamin (MULTIVITAMIN) tablet Take 1 tablet by mouth daily.    . pravastatin (PRAVACHOL) 20 MG tablet Take 20 mg by mouth daily.    . Vitamin D, Ergocalciferol, (DRISDOL) 50000 UNITS CAPS capsule Take 50,000 Units by mouth once a week.  0   No current  facility-administered medications for this visit.    OBJECTIVE:  Filed Vitals:   07/19/15 1425  BP: 136/77  Pulse: 80  Temp: 96.9 F (36.1 C)  Resp: 18     Body mass index is 23.91 kg/(m^2).    ECOG FS:1 - Symptomatic but completely ambulatory  PHYSICAL EXAM: GENERAL:  Well developed, well nourished, sitting comfortably in the exam room in no acute distress. MENTAL STATUS:  Alert and oriented to person, place and time. HEAD:   Normocephalic, atraumatic, face symmetric, no Cushingoid features. EYES:  .  Pupils equal round and reactive to light and accomodation.  No conjunctivitis or scleral icterus. ENT:  Oropharynx clear without lesion.  Tongue normal. Mucous membranes moist.  RESPIRATORY:  Clear to auscultation without rales, wheezes or rhonchi. CARDIOVASCULAR:  Regular rate and rhythm without murmur, rub or gallop.  ABDOMEN:  Soft, non-tender, with active bowel sounds, and no hepatosplenomegaly.  No masses. BACK:  No CVA tenderness.  No tenderness on percussion of the back or rib cage. SKIN:  No rashes, ulcers or lesions. EXTREMITIES: No edema, no skin discoloration or tenderness.  No palpable cords. LYMPH NODES: No palpable cervical, supraclavicular, axillary or inguinal adenopathy  NEUROLOGICAL: Unremarkable. PSYCH:  Appropriate.   LAB RESULTS:   Lab results has been reviewed.   CALCIUM IS 9.3     Lab Results  Component Value Date   PSA 9.99* 06/21/2015        ASSESSMENT: 80 year old gentleman with stage IV carcinoma prostate.  Presently onXTANDI and Lupron. Rising PSA.  (Almost double in last 4 weeks)Will  Continue follow-up with Lupron and XGEVA.  The PSA continues to go up CT scan will be scheduled prior to next appointment [place] that with the patient and family.  The PSA remains in stable range then patient will come on April 11 and we will get his extremity and Lupron.  His son is advised to call me if has any significant new problems.  Patient is  again has been counseled regarding smoking   Forest Gleason, MD   07/19/2015 2:50 PM

## 2015-07-20 LAB — PSA: PSA: 9.54 ng/mL — ABNORMAL HIGH (ref 0.00–4.00)

## 2015-07-22 ENCOUNTER — Telehealth: Payer: Self-pay | Admitting: *Deleted

## 2015-07-22 NOTE — Telephone Encounter (Signed)
Left message with psa results of 9.54. Instructed pt to call back with any further questions.

## 2015-08-16 ENCOUNTER — Inpatient Hospital Stay: Payer: Medicare Other | Attending: Oncology

## 2015-08-16 ENCOUNTER — Inpatient Hospital Stay: Payer: Medicare Other

## 2015-08-16 DIAGNOSIS — J45909 Unspecified asthma, uncomplicated: Secondary | ICD-10-CM | POA: Insufficient documentation

## 2015-08-16 DIAGNOSIS — C61 Malignant neoplasm of prostate: Secondary | ICD-10-CM | POA: Insufficient documentation

## 2015-08-16 DIAGNOSIS — Z923 Personal history of irradiation: Secondary | ICD-10-CM | POA: Diagnosis not present

## 2015-08-16 DIAGNOSIS — Z79899 Other long term (current) drug therapy: Secondary | ICD-10-CM | POA: Diagnosis not present

## 2015-08-16 DIAGNOSIS — C7951 Secondary malignant neoplasm of bone: Secondary | ICD-10-CM | POA: Insufficient documentation

## 2015-08-16 DIAGNOSIS — R011 Cardiac murmur, unspecified: Secondary | ICD-10-CM | POA: Insufficient documentation

## 2015-08-16 DIAGNOSIS — R9721 Rising PSA following treatment for malignant neoplasm of prostate: Secondary | ICD-10-CM | POA: Insufficient documentation

## 2015-08-16 DIAGNOSIS — E785 Hyperlipidemia, unspecified: Secondary | ICD-10-CM | POA: Insufficient documentation

## 2015-08-16 DIAGNOSIS — Z8619 Personal history of other infectious and parasitic diseases: Secondary | ICD-10-CM | POA: Insufficient documentation

## 2015-08-16 DIAGNOSIS — Z79818 Long term (current) use of other agents affecting estrogen receptors and estrogen levels: Secondary | ICD-10-CM | POA: Diagnosis not present

## 2015-08-16 DIAGNOSIS — F17210 Nicotine dependence, cigarettes, uncomplicated: Secondary | ICD-10-CM | POA: Insufficient documentation

## 2015-08-16 DIAGNOSIS — R0602 Shortness of breath: Secondary | ICD-10-CM | POA: Diagnosis not present

## 2015-08-16 DIAGNOSIS — R918 Other nonspecific abnormal finding of lung field: Secondary | ICD-10-CM | POA: Diagnosis not present

## 2015-08-16 LAB — COMPREHENSIVE METABOLIC PANEL
ALK PHOS: 57 U/L (ref 38–126)
ALT: 16 U/L — AB (ref 17–63)
AST: 20 U/L (ref 15–41)
Albumin: 4.4 g/dL (ref 3.5–5.0)
Anion gap: 4 — ABNORMAL LOW (ref 5–15)
BUN: 16 mg/dL (ref 6–20)
CALCIUM: 9.1 mg/dL (ref 8.9–10.3)
CHLORIDE: 106 mmol/L (ref 101–111)
CO2: 25 mmol/L (ref 22–32)
CREATININE: 0.68 mg/dL (ref 0.61–1.24)
GFR calc Af Amer: >60 mL/min (ref >60)
GFR calc non Af Amer: >60 mL/min (ref >60)
GLUCOSE: 105 mg/dL — AB (ref 65–99)
Potassium: 3.8 mmol/L (ref 3.5–5.1)
SODIUM: 135 mmol/L (ref 135–145)
Total Bilirubin: 0.5 mg/dL (ref 0.3–1.2)
Total Protein: 7 g/dL (ref 6.5–8.1)

## 2015-08-16 LAB — CBC WITH DIFFERENTIAL/PLATELET
BASOS PCT: 1 %
Basophils Absolute: 0 10*3/uL (ref 0–0.1)
EOS ABS: 0.1 10*3/uL (ref 0–0.7)
EOS PCT: 2 %
HCT: 34.1 % — ABNORMAL LOW (ref 40.0–52.0)
Hemoglobin: 12 g/dL — ABNORMAL LOW (ref 13.0–18.0)
LYMPHS ABS: 2.3 10*3/uL (ref 1.0–3.6)
Lymphocytes Relative: 41 %
MCH: 31.7 pg (ref 26.0–34.0)
MCHC: 35.2 g/dL (ref 32.0–36.0)
MCV: 90 fL (ref 80.0–100.0)
Monocytes Absolute: 0.4 10*3/uL (ref 0.2–1.0)
Monocytes Relative: 7 %
Neutro Abs: 2.8 10*3/uL (ref 1.4–6.5)
Neutrophils Relative %: 49 %
PLATELETS: 181 10*3/uL (ref 150–440)
RBC: 3.79 MIL/uL — AB (ref 4.40–5.90)
RDW: 14 % (ref 11.5–14.5)
WBC: 5.7 10*3/uL (ref 3.8–10.6)

## 2015-08-16 MED ORDER — DENOSUMAB 120 MG/1.7ML ~~LOC~~ SOLN
120.0000 mg | Freq: Once | SUBCUTANEOUS | Status: AC
  Administered 2015-08-16: 120 mg via SUBCUTANEOUS
  Filled 2015-08-16: qty 1.7

## 2015-08-16 MED ORDER — LEUPROLIDE ACETATE (3 MONTH) 22.5 MG IM KIT
22.5000 mg | PACK | Freq: Once | INTRAMUSCULAR | Status: AC
  Administered 2015-08-16: 22.5 mg via INTRAMUSCULAR
  Filled 2015-08-16: qty 22.5

## 2015-08-17 LAB — PSA: PSA: 15.08 ng/mL — AB (ref 0.00–4.00)

## 2015-08-23 ENCOUNTER — Encounter: Payer: Self-pay | Admitting: Oncology

## 2015-08-23 ENCOUNTER — Inpatient Hospital Stay (HOSPITAL_BASED_OUTPATIENT_CLINIC_OR_DEPARTMENT_OTHER): Payer: Medicare Other | Admitting: Oncology

## 2015-08-23 VITALS — BP 146/71 | HR 69 | Temp 97.7°F | Resp 18 | Wt 173.1 lb

## 2015-08-23 DIAGNOSIS — J45909 Unspecified asthma, uncomplicated: Secondary | ICD-10-CM

## 2015-08-23 DIAGNOSIS — Z79899 Other long term (current) drug therapy: Secondary | ICD-10-CM | POA: Diagnosis not present

## 2015-08-23 DIAGNOSIS — C61 Malignant neoplasm of prostate: Secondary | ICD-10-CM

## 2015-08-23 DIAGNOSIS — R0602 Shortness of breath: Secondary | ICD-10-CM

## 2015-08-23 DIAGNOSIS — Z79818 Long term (current) use of other agents affecting estrogen receptors and estrogen levels: Secondary | ICD-10-CM

## 2015-08-23 DIAGNOSIS — Z8619 Personal history of other infectious and parasitic diseases: Secondary | ICD-10-CM

## 2015-08-23 DIAGNOSIS — R9721 Rising PSA following treatment for malignant neoplasm of prostate: Secondary | ICD-10-CM

## 2015-08-23 DIAGNOSIS — R918 Other nonspecific abnormal finding of lung field: Secondary | ICD-10-CM

## 2015-08-23 DIAGNOSIS — F17210 Nicotine dependence, cigarettes, uncomplicated: Secondary | ICD-10-CM

## 2015-08-23 DIAGNOSIS — Z923 Personal history of irradiation: Secondary | ICD-10-CM

## 2015-08-23 DIAGNOSIS — E785 Hyperlipidemia, unspecified: Secondary | ICD-10-CM

## 2015-08-23 DIAGNOSIS — R011 Cardiac murmur, unspecified: Secondary | ICD-10-CM

## 2015-08-23 DIAGNOSIS — C7951 Secondary malignant neoplasm of bone: Secondary | ICD-10-CM | POA: Diagnosis not present

## 2015-08-23 NOTE — Progress Notes (Signed)
Patient already has been seen yesterday  [place] @ [facility] Telephone:([phone]) [phone]  Fax:([phone]) [place] OB: March 29, 1936  MR#: [id number]  [id number]  Patient Care Team: Preston Simmer, MD as PCP - General (Endocrinology)  CHIEF COMPLAINT:  Chief Complaint  Patient presents with  . Prostate Cancer   Oncology History   1. Carcinoma of prostate diagnosis in 2006.  Had received radiation therapy, external beam to prostate and pelvic lymph nodes.  Had Gleason 9 (4+5.)  PainBaseline PSA was 13. 2. Received Lupron injection after her radiation therapy also.  PSA was going up.  Last year or so received Lupron injection in September but did not have any followup.   3. Recently (this August, 2012) PSA was found to be 17. Patient was started on Lupron from September of 2012. 4. Patient has progressive disease by PSA criteria (October, 2013) 5. Patient was started on Zytiga and prednisone in November of 2013. 6.traumatic hip fracture ((right femur) status post internally fixed ,no evidence of metastatic disease(May of 2014) 7.bone scan in July of 2014 at outside institutions shows metastases to left scapula and left 11th rib/.. 8.progressive On ZYTIGA by PSA criteria.  Will start patient on [place], September of2015   9.progressive disease on XTANDI by tumor markers and bone scan (December, 2016 [name] has been discontinued PATIENT is on Lupron and [place] 11/24/2014 02/16/2015 05/24/2015 06/21/2015 07/19/2015 08/16/2015  denosumab (XGEVA) [place] - - 120 mg 120 mg 120 mg 120 mg  leuprolide (LUPRON) IM 22.5 mg 22.5 mg 22.5 mg - - 22.5 mg    INTERVAL HISTORY: 80 year old gentleman was a chronic smoker and is stage IV carcinoma prostate presently onXTANDI and on Lupron.  Patient's appetite is getting better.  Patient continues to smoke.  No difficulty passing urine.  Tolerating treatment very well.  Here for further follow-up and treatment  consideration He  is tolerating treatment very well.  No rash.  No abdominal pain.  No nausea.  Complains of hot flashes Stage IV carcinoma prostate Patient is here for further follow-up had a CT scan of chest and abdomen and a bone scan. The scan of chest and abdomen does not show any evidence of retroperitoneal lymphadenopathy.  There are only 2 areas in the bone shows persistent increased uptake but there is no evidence of bony pain at present time. Patient has a stable pulmonary nodules .Marland Kitchen And patient had a history of smoking. Patient does not have any bony pain appetite has been stable gaining weight. On XGEVA because of bone metastases.  Patient is quite concerned about rising PSA which is 9.9 last month.  No bony pains.  Appetite has been stable. Patient continues to smoke at pulmonary nodule on CT scan which is being observed.  Patient continues to smoke has no bone pain appetite has been stable.  Patient continues to rising PSA REVIEW OF SYSTEMS:   GENERAL:  Feels good.  Active.  No fevers, sweats or weight loss. PERFORMANCE STATUS (ECOG):  01 HEENT:  No visual changes, runny nose, sore throat, mouth sores or tenderness. Lungs: No shortness of breath or cough.  No hemoptysis. Cardiac:  No chest pain, palpitations, orthopnea, or PND. GI:  No nausea, vomiting, diarrhea, constipation, melena or hematochezia. GU:  No urgency, frequency, dysuria, or hematuria. Musculoskeletal:  No back pain.  No joint pain.  No muscle tenderness. Extremities:  No pain or swelling. Skin:  No rashes or skin changes. Neuro:  No headache, numbness or weakness, balance or coordination issues. Endocrine:  No diabetes, thyroid issues, hot flashes or night sweats. Psych:  No mood changes, depression or anxiety. Pain:  No focal pain. Review of systems:  All other systems reviewed and found to be negative. As per HPI. Otherwise, a complete review of systems is negatve.  PAST MEDICAL HISTORY: Past Medical  History  Diagnosis Date  . Prostate cancer ([place])   . Osteomyelitis ([place])   . Hypocalcemia   . Hyperlipidemia   . Heart murmur   . SOB (shortness of breath)   . Asthma     PAST SURGICAL HISTORY: Past Surgical History  Procedure Laterality Date  . Excisional hemorrhoidectomy    . Tonsillectomy    . Colonoscopy with propofol N/A 04/12/2015    Procedure: COLONOSCOPY WITH PROPOFOL;  Surgeon: Lollie Sails, MD;  Location: [facility]COPY;  Service: Endoscopy;  Laterality: N/A;    FAMILY HISTORY  No Known Allergies:   Significant History/PMH:   Hypocalcemia:    Osteomyelitis:    Prostate Cancer:    Right Pinning of Intertrochanteric Femur Fracture  Right: 01-Sep-2012   Hemorrhoidectomy:    Tonsillectomy:   Preventive Screening:  Has patient had any of the following test? Colonscopy  Prostate Exam   Last Colonoscopy: 2011   Last Prostate Exam: August 2012   Smoking History: Smoking History 0.5 Packs per day. Smoking Cessation Information Given to Patient .  PFSH: Family History: No family history of colorectal cancer, breast cancer, or ovarian cancer.  Comments: Patient does not smoke does not drink  Additional Past Medical and Surgical History: Hyper cholesteremia  No other significant past medical history        ADVANCED DIRECTIVES:    HEALTH MAINTENANCE: Social History  Substance Use Topics  . Smoking status: Current Every Day Smoker  . Smokeless tobacco: Never Used  . Alcohol Use: No      No Known Allergies  Current Outpatient Prescriptions  Medication Sig Dispense Refill  . ADVAIR DISKUS 100-50 MCG/DOSE AEPB inhale 1 dose by mouth twice a day  0  . calcium-vitamin D (OSCAL WITH D) 500-200 MG-UNIT per tablet Take 1 tablet by mouth.    . calcium-vitamin D 250-100 MG-UNIT per tablet Take 1 tablet by mouth 2 (two) times daily.    . Multiple Vitamin (MULTIVITAMIN) tablet Take 1 tablet by mouth daily.    . pravastatin (PRAVACHOL) 20 MG tablet  Take 20 mg by mouth daily.    . Vitamin D, Ergocalciferol, (DRISDOL) 50000 UNITS CAPS capsule Take 50,000 Units by mouth once a week.  0   No current facility-administered medications for this visit.    OBJECTIVE:  Filed Vitals:   08/23/15 1442  BP: 146/71  Pulse: 69  Temp: 97.7 F (36.5 C)  Resp: 18     Body mass index is 23.47 kg/(m^2).    ECOG FS:1 - Symptomatic but completely ambulatory  PHYSICAL EXAM: GENERAL:  Well developed, well nourished, sitting comfortably in the exam room in no acute distress. MENTAL STATUS:  Alert and oriented to person, place and time. HEAD:   Normocephalic, atraumatic, face symmetric, no Cushingoid features. EYES:  .  Pupils equal round and reactive to light and accomodation.  No conjunctivitis or scleral icterus. ENT:  Oropharynx clear without lesion.  Tongue normal. Mucous membranes moist.  RESPIRATORY:  Clear to auscultation without rales, wheezes or rhonchi. CARDIOVASCULAR:  Regular rate and rhythm without murmur, rub or gallop.  ABDOMEN:  Soft, non-tender,  with active bowel sounds, and no hepatosplenomegaly.  No masses. BACK:  No CVA tenderness.  No tenderness on percussion of the back or rib cage. SKIN:  No rashes, ulcers or lesions. EXTREMITIES: No edema, no skin discoloration or tenderness.  No palpable cords. LYMPH NODES: No palpable cervical, supraclavicular, axillary or inguinal adenopathy  NEUROLOGICAL: Unremarkable. PSYCH:  Appropriate.   LAB RESULTS:   Lab results has been reviewed.   CALCIUM IS 9.3     Lab Results  Component Value Date   PSA 15.08* 08/16/2015        ASSESSMENT: 80 year old gentleman with stage IV carcinoma prostate. Progressing disease on XTANDI so which has been discontinued.  Patient is on Lupron and [place]. Rising PSA.  (Almost double in last 4 weeks)Will  Continue follow-up with Lupron and XGEVA.  The PSA continues to go up CT scan will be scheduled prior to next appointment Continue  observation as patient remains asymptomatic at present time  Patient is again has been counseled regarding smoking   Forest Gleason, MD   08/23/2015 3:03 PM

## 2015-09-20 ENCOUNTER — Encounter: Payer: Self-pay | Admitting: Oncology

## 2015-09-21 ENCOUNTER — Inpatient Hospital Stay (HOSPITAL_BASED_OUTPATIENT_CLINIC_OR_DEPARTMENT_OTHER): Payer: Medicare Other | Admitting: Oncology

## 2015-09-21 ENCOUNTER — Inpatient Hospital Stay: Payer: Medicare Other | Attending: Oncology

## 2015-09-21 ENCOUNTER — Encounter: Payer: Self-pay | Admitting: Oncology

## 2015-09-21 ENCOUNTER — Ambulatory Visit
Admission: RE | Admit: 2015-09-21 | Discharge: 2015-09-21 | Disposition: A | Discharge status: Home / Self Care | Payer: Medicare Other | Source: Ambulatory Visit | Attending: Oncology | Admitting: Oncology

## 2015-09-21 ENCOUNTER — Inpatient Hospital Stay: Payer: Medicare Other

## 2015-09-21 VITALS — BP 123/72 | HR 93 | Temp 97.5°F | Resp 18 | Wt 170.6 lb

## 2015-09-21 DIAGNOSIS — Z79899 Other long term (current) drug therapy: Secondary | ICD-10-CM | POA: Insufficient documentation

## 2015-09-21 DIAGNOSIS — C61 Malignant neoplasm of prostate: Secondary | ICD-10-CM

## 2015-09-21 DIAGNOSIS — E875 Hyperkalemia: Secondary | ICD-10-CM

## 2015-09-21 DIAGNOSIS — Z8619 Personal history of other infectious and parasitic diseases: Secondary | ICD-10-CM

## 2015-09-21 DIAGNOSIS — N281 Cyst of kidney, acquired: Secondary | ICD-10-CM | POA: Diagnosis not present

## 2015-09-21 DIAGNOSIS — R9721 Rising PSA following treatment for malignant neoplasm of prostate: Secondary | ICD-10-CM | POA: Insufficient documentation

## 2015-09-21 DIAGNOSIS — R918 Other nonspecific abnormal finding of lung field: Secondary | ICD-10-CM | POA: Insufficient documentation

## 2015-09-21 DIAGNOSIS — R0602 Shortness of breath: Secondary | ICD-10-CM

## 2015-09-21 DIAGNOSIS — J45909 Unspecified asthma, uncomplicated: Secondary | ICD-10-CM | POA: Insufficient documentation

## 2015-09-21 DIAGNOSIS — Z923 Personal history of irradiation: Secondary | ICD-10-CM

## 2015-09-21 DIAGNOSIS — F17210 Nicotine dependence, cigarettes, uncomplicated: Secondary | ICD-10-CM

## 2015-09-21 DIAGNOSIS — C7951 Secondary malignant neoplasm of bone: Secondary | ICD-10-CM

## 2015-09-21 DIAGNOSIS — R011 Cardiac murmur, unspecified: Secondary | ICD-10-CM

## 2015-09-21 DIAGNOSIS — R911 Solitary pulmonary nodule: Secondary | ICD-10-CM | POA: Diagnosis not present

## 2015-09-21 LAB — CBC WITH DIFFERENTIAL/PLATELET
BASOS ABS: 0 10*3/uL (ref 0–0.1)
Basophils Relative: 0 %
Eosinophils Absolute: 0 10*3/uL (ref 0–0.7)
Eosinophils Relative: 1 %
HEMATOCRIT: 33.5 % — AB (ref 40.0–52.0)
HEMOGLOBIN: 11.6 g/dL — AB (ref 13.0–18.0)
LYMPHS PCT: 35 %
Lymphs Abs: 1.7 10*3/uL (ref 1.0–3.6)
MCH: 31.4 pg (ref 26.0–34.0)
MCHC: 34.6 g/dL (ref 32.0–36.0)
MCV: 90.7 fL (ref 80.0–100.0)
MONO ABS: 0.2 10*3/uL (ref 0.2–1.0)
Monocytes Relative: 5 %
NEUTROS ABS: 2.8 10*3/uL (ref 1.4–6.5)
NEUTROS PCT: 59 %
Platelets: 226 10*3/uL (ref 150–440)
RBC: 3.7 MIL/uL — AB (ref 4.40–5.90)
RDW: 13.9 % (ref 11.5–14.5)
WBC: 4.8 10*3/uL (ref 3.8–10.6)

## 2015-09-21 LAB — COMPREHENSIVE METABOLIC PANEL
ALBUMIN: 4.2 g/dL (ref 3.5–5.0)
ALK PHOS: 54 U/L (ref 38–126)
ALT: 14 U/L — AB (ref 17–63)
AST: 21 U/L (ref 15–41)
Anion gap: 7 (ref 5–15)
BILIRUBIN TOTAL: 0.6 mg/dL (ref 0.3–1.2)
BUN: 14 mg/dL (ref 6–20)
CO2: 26 mmol/L (ref 22–32)
CREATININE: 0.61 mg/dL (ref 0.61–1.24)
Calcium: 9.1 mg/dL (ref 8.9–10.3)
Chloride: 101 mmol/L (ref 101–111)
GFR calc Af Amer: >60 mL/min (ref >60)
Glucose, Bld: 162 mg/dL — ABNORMAL HIGH (ref 65–99)
Potassium: 4.3 mmol/L (ref 3.5–5.1)
Sodium: 134 mmol/L — ABNORMAL LOW (ref 135–145)
TOTAL PROTEIN: 7.3 g/dL (ref 6.5–8.1)

## 2015-09-21 LAB — PSA: PSA: 27.98 ng/mL — AB (ref 0.00–4.00)

## 2015-09-21 MED ORDER — IOPAMIDOL (ISOVUE-300) INJECTION 61%
100.0000 mL | Freq: Once | INTRAVENOUS | Status: AC | PRN
  Administered 2015-09-21: 100 mL via INTRAVENOUS

## 2015-09-21 MED ORDER — DENOSUMAB 120 MG/1.7ML ~~LOC~~ SOLN
120.0000 mg | Freq: Once | SUBCUTANEOUS | Status: AC
  Administered 2015-09-21: 120 mg via SUBCUTANEOUS
  Filled 2015-09-21: qty 1.7

## 2015-09-21 NOTE — Progress Notes (Signed)
Patient already has been seen yesterday  [place] @ [facility] [phone]  Fax:([phone]) [place] OB: 06/23/1935  MR#: [id number]  [id number]  Patient Care Team: Lenard Simmer, MD as PCP - General (Endocrinology)  CHIEF COMPLAINT:  Chief Complaint  Patient presents with  . Prostate Cancer   Oncology History   1. Carcinoma of prostate diagnosis in 2006.  Had received radiation therapy, external beam to prostate and pelvic lymph nodes.  Had Gleason 9 (4+5.)  PainBaseline PSA was 13. 2. Received Lupron injection after her radiation therapy also.  PSA was going up.  Last year or so received Lupron injection in September but did not have any followup.   3. Recently (this August, 2012) PSA was found to be 17. Patient was started on Lupron from September of 2012. 4. Patient has progressive disease by PSA criteria (October, 2013) 5. Patient was started on Zytiga and prednisone in November of 2013. 6.traumatic hip fracture ((right femur) status post internally fixed ,no evidence of metastatic disease(May of 2014) 7.bone scan in July of 2014 at outside institutions shows metastases to left scapula and left 11th rib/.. 8.progressive On ZYTIGA by PSA criteria.  Will start patient on [place], September of2015   9.progressive disease on XTANDI by tumor markers and bone scan (December, 2016 [name] has been discontinued PATIENT is on Lupron and [place] 11/24/2014 02/16/2015 05/24/2015 06/21/2015 07/19/2015 08/16/2015  denosumab (XGEVA) [place] - - 120 mg 120 mg 120 mg 120 mg  leuprolide (LUPRON) IM 22.5 mg 22.5 mg 22.5 mg - - 22.5 mg    INTERVAL HISTORY: 80 year old gentleman was a chronic smoker and is stage IV carcinoma prostate presently onXTANDI and on Lupron.  Patient's appetite is getting better.  Patient continues to smoke.  No difficulty passing urine.  Tolerating treatment very well.  Here for further follow-up and treatment  consideration He  is tolerating treatment very well.  No rash.  No abdominal pain.  No nausea.  Complains of hot flashes Stage IV carcinoma prostate Patient is here for further follow-up had a CT scan of chest and abdomen and a bone scan. The scan of chest and abdomen does not show any evidence of retroperitoneal lymphadenopathy.  There are only 2 areas in the bone shows persistent increased uptake but there is no evidence of bony pain at present time. Patient has a stable pulmonary nodules .Marland Kitchen And patient had a history of smoking. Patient does not have any bony pain appetite has been stable gaining weight. On XGEVA because of bone metastases.  Patient is quite concerned about rising PSA which is 9.9 last month.  No bony pains.  Appetite has been stable. Patient continues to smoke at pulmonary nodule on CT scan which is being observed. Patient continues to smoke.  He remains relatively asymptomatic except for rising PSA.  Patient continues to smoke has no bone pain appetite has been stable.  Patient continues to rising PSA REVIEW OF SYSTEMS:   GENERAL:  Feels good.  Active.  No fevers, sweats or weight loss. PERFORMANCE STATUS (ECOG):  01 HEENT:  No visual changes, runny nose, sore throat, mouth sores or tenderness. Lungs: No shortness of breath or cough.  No hemoptysis. Cardiac:  No chest pain, palpitations, orthopnea, or PND. GI:  No nausea, vomiting, diarrhea, constipation, melena or hematochezia. GU:  No urgency, frequency, dysuria, or hematuria. Musculoskeletal:  No back pain.  No joint pain.  No muscle tenderness. Extremities:  No pain or swelling. Skin:  No rashes or skin changes. Neuro:  No headache, numbness or weakness, balance or coordination issues. Endocrine:  No diabetes, thyroid issues, hot flashes or night sweats. Psych:  No mood changes, depression or anxiety. Pain:  No focal pain. Review of systems:  All other systems reviewed and found to be negative. As per HPI.  Otherwise, a complete review of systems is negatve.  PAST MEDICAL HISTORY: Past Medical History  Diagnosis Date  . Prostate cancer ([place])   . Osteomyelitis ([place])   . Hypocalcemia   . Hyperlipidemia   . Heart murmur   . SOB (shortness of breath)   . Asthma     PAST SURGICAL HISTORY: Past Surgical History  Procedure Laterality Date  . Excisional hemorrhoidectomy    . Tonsillectomy    . Colonoscopy with propofol N/A 04/12/2015    Procedure: COLONOSCOPY WITH PROPOFOL;  Surgeon: Lollie Sails, MD;  Location: [facility] ENDOSCOPY;  Service: Endoscopy;  Laterality: N/A;    FAMILY HISTORY  No Known Allergies:   Significant History/PMH:   Hypocalcemia:    Osteomyelitis:    Prostate Cancer:    Right Pinning of Intertrochanteric Femur Fracture  Right: 01-Sep-2012   Hemorrhoidectomy:    Tonsillectomy:   Preventive Screening:  Has patient had any of the following test? Colonscopy  Prostate Exam   Last Colonoscopy: 2011   Last Prostate Exam: August 2012   Smoking History: Smoking History 0.5 Packs per day. Smoking Cessation Information Given to Patient .  PFSH: Family History: No family history of colorectal cancer, breast cancer, or ovarian cancer.  Comments: Patient does not smoke does not drink  Additional Past Medical and Surgical History: Hyper cholesteremia  No other significant past medical history        ADVANCED DIRECTIVES:    HEALTH MAINTENANCE: Social History  Substance Use Topics  . Smoking status: Current Every Day Smoker  . Smokeless tobacco: Never Used  . Alcohol Use: No      No Known Allergies  Current Outpatient Prescriptions  Medication Sig Dispense Refill  . ADVAIR DISKUS 100-50 MCG/DOSE AEPB inhale 1 dose by mouth twice a day  0  . calcium-vitamin D (OSCAL WITH D) 500-200 MG-UNIT per tablet Take 1 tablet by mouth.    . Multiple Vitamin (MULTIVITAMIN) tablet Take 1 tablet by mouth daily.    . pravastatin (PRAVACHOL) 20 MG tablet Take 20  mg by mouth daily.    . Vitamin D, Ergocalciferol, (DRISDOL) 50000 UNITS CAPS capsule Take 50,000 Units by mouth once a week.  0   No current facility-administered medications for this visit.    OBJECTIVE:  Filed Vitals:   09/21/15 1459  BP: 123/72  Pulse: 93  Temp: 97.5 F (36.4 C)  Resp: 18     Body mass index is 23.14 kg/(m^2).    ECOG FS:1 - Symptomatic but completely ambulatory  PHYSICAL EXAM: GENERAL:  Well developed, well nourished, sitting comfortably in the exam room in no acute distress. MENTAL STATUS:  Alert and oriented to person, place and time. HEAD:   Normocephalic, atraumatic, face symmetric, no Cushingoid features. EYES:  .  Pupils equal round and reactive to light and accomodation.  No conjunctivitis or scleral icterus. ENT:  Oropharynx clear without lesion.  Tongue normal. Mucous membranes moist.  RESPIRATORY:  Clear to auscultation without rales, wheezes or rhonchi. CARDIOVASCULAR:  Regular rate and rhythm without murmur, rub or gallop.  ABDOMEN:  Soft, non-tender, with active bowel sounds, and no  hepatosplenomegaly.  No masses. BACK:  No CVA tenderness.  No tenderness on percussion of the back or rib cage. SKIN:  No rashes, ulcers or lesions. EXTREMITIES: No edema, no skin discoloration or tenderness.  No palpable cords. LYMPH NODES: No palpable cervical, supraclavicular, axillary or inguinal adenopathy  NEUROLOGICAL: Unremarkable. PSYCH:  Appropriate.   LAB RESULTS: PSA is rising 27.98   All of the lab data has been reviewed    Calcium is 9.1  Lab Results  Component Value Date   PSA 15.08* 08/16/2015        ASSESSMENT: 80 year old gentleman with stage IV carcinoma prostate. Progressing disease on XTANDI so which has been discontinued.  Patient is on Lupron and xgeva  psa   continues to go walk and possibility of chemotherapy with Taxotere can be considered. His line CT scan and bone scan can be done if we decide to initiate  chemotherapy  Forest Gleason, MD   09/21/2015 3:29 PM

## 2015-09-21 NOTE — Progress Notes (Signed)
Patient here for CT results.  

## 2015-09-27 ENCOUNTER — Telehealth: Payer: Self-pay | Admitting: *Deleted

## 2015-09-27 ENCOUNTER — Encounter: Payer: Self-pay | Admitting: Oncology

## 2015-09-27 NOTE — Telephone Encounter (Signed)
Per Dr. Oliva Bustard, pt has rising PSA and will need to start chemotherapy. Pt may wait until appt with Dr. Jacinto Reap on 6/14 but can come sooner if wishes. Spoke with pt's wife and informed her of this info. She verbalized understanding. She would like to discuss with patient and let me know if wants to move appt. Will await callback.

## 2015-09-27 NOTE — Telephone Encounter (Signed)
Pt's wife called back and stated that would like to keep appt as scheduled on 10/19/15 with Dr. Jacinto Reap.

## 2015-10-18 ENCOUNTER — Other Ambulatory Visit: Payer: Self-pay | Admitting: *Deleted

## 2015-10-18 DIAGNOSIS — C61 Malignant neoplasm of prostate: Secondary | ICD-10-CM

## 2015-10-19 ENCOUNTER — Inpatient Hospital Stay: Payer: Medicare Other | Attending: Internal Medicine

## 2015-10-19 ENCOUNTER — Inpatient Hospital Stay: Payer: Medicare Other

## 2015-10-19 ENCOUNTER — Inpatient Hospital Stay (HOSPITAL_BASED_OUTPATIENT_CLINIC_OR_DEPARTMENT_OTHER): Payer: Medicare Other | Admitting: Internal Medicine

## 2015-10-19 ENCOUNTER — Telehealth: Payer: Self-pay | Admitting: *Deleted

## 2015-10-19 VITALS — BP 127/77 | HR 88 | Temp 98.2°F | Resp 18 | Wt 166.7 lb

## 2015-10-19 DIAGNOSIS — Z8781 Personal history of (healed) traumatic fracture: Secondary | ICD-10-CM | POA: Diagnosis not present

## 2015-10-19 DIAGNOSIS — R5383 Other fatigue: Secondary | ICD-10-CM | POA: Diagnosis not present

## 2015-10-19 DIAGNOSIS — C7951 Secondary malignant neoplasm of bone: Secondary | ICD-10-CM | POA: Insufficient documentation

## 2015-10-19 DIAGNOSIS — F17210 Nicotine dependence, cigarettes, uncomplicated: Secondary | ICD-10-CM | POA: Insufficient documentation

## 2015-10-19 DIAGNOSIS — Z79899 Other long term (current) drug therapy: Secondary | ICD-10-CM | POA: Diagnosis not present

## 2015-10-19 DIAGNOSIS — R011 Cardiac murmur, unspecified: Secondary | ICD-10-CM

## 2015-10-19 DIAGNOSIS — J45909 Unspecified asthma, uncomplicated: Secondary | ICD-10-CM | POA: Diagnosis not present

## 2015-10-19 DIAGNOSIS — R630 Anorexia: Secondary | ICD-10-CM | POA: Insufficient documentation

## 2015-10-19 DIAGNOSIS — Z923 Personal history of irradiation: Secondary | ICD-10-CM | POA: Diagnosis not present

## 2015-10-19 DIAGNOSIS — E785 Hyperlipidemia, unspecified: Secondary | ICD-10-CM | POA: Insufficient documentation

## 2015-10-19 DIAGNOSIS — C61 Malignant neoplasm of prostate: Secondary | ICD-10-CM

## 2015-10-19 DIAGNOSIS — R634 Abnormal weight loss: Secondary | ICD-10-CM | POA: Insufficient documentation

## 2015-10-19 DIAGNOSIS — R0602 Shortness of breath: Secondary | ICD-10-CM | POA: Diagnosis not present

## 2015-10-19 LAB — COMPREHENSIVE METABOLIC PANEL
ALBUMIN: 4.2 g/dL (ref 3.5–5.0)
ALT: 15 U/L — ABNORMAL LOW (ref 17–63)
AST: 21 U/L (ref 15–41)
Alkaline Phosphatase: 67 U/L (ref 38–126)
Anion gap: 8 (ref 5–15)
BUN: 16 mg/dL (ref 6–20)
CHLORIDE: 103 mmol/L (ref 101–111)
CO2: 25 mmol/L (ref 22–32)
CREATININE: 0.72 mg/dL (ref 0.61–1.24)
Calcium: 9.4 mg/dL (ref 8.9–10.3)
GFR calc non Af Amer: >60 mL/min (ref >60)
GLUCOSE: 147 mg/dL — AB (ref 65–99)
Potassium: 3.8 mmol/L (ref 3.5–5.1)
SODIUM: 136 mmol/L (ref 135–145)
Total Bilirubin: 0.8 mg/dL (ref 0.3–1.2)
Total Protein: 7.3 g/dL (ref 6.5–8.1)

## 2015-10-19 LAB — CBC WITH DIFFERENTIAL/PLATELET
Basophils Absolute: 0 10*3/uL (ref 0–0.1)
Basophils Relative: 1 %
Eosinophils Absolute: 0 10*3/uL (ref 0–0.7)
Eosinophils Relative: 1 %
HEMATOCRIT: 36 % — AB (ref 40.0–52.0)
HEMOGLOBIN: 12.4 g/dL — AB (ref 13.0–18.0)
LYMPHS ABS: 2.5 10*3/uL (ref 1.0–3.6)
LYMPHS PCT: 38 %
MCH: 31.4 pg (ref 26.0–34.0)
MCHC: 34.5 g/dL (ref 32.0–36.0)
MCV: 91 fL (ref 80.0–100.0)
MONO ABS: 0.3 10*3/uL (ref 0.2–1.0)
MONOS PCT: 5 %
NEUTROS ABS: 3.6 10*3/uL (ref 1.4–6.5)
NEUTROS PCT: 55 %
Platelets: 187 10*3/uL (ref 150–440)
RBC: 3.96 MIL/uL — ABNORMAL LOW (ref 4.40–5.90)
RDW: 14.1 % (ref 11.5–14.5)
WBC: 6.5 10*3/uL (ref 3.8–10.6)

## 2015-10-19 LAB — PSA: PSA: 43.79 ng/mL — ABNORMAL HIGH (ref 0.00–4.00)

## 2015-10-19 MED ORDER — DENOSUMAB 120 MG/1.7ML ~~LOC~~ SOLN
120.0000 mg | Freq: Once | SUBCUTANEOUS | Status: AC
  Administered 2015-10-19: 120 mg via SUBCUTANEOUS
  Filled 2015-10-19: qty 1.7

## 2015-10-19 NOTE — Assessment & Plan Note (Signed)
Continue Xgeva on a monthly basis. No side effects noted. Calcium within normal limits.

## 2015-10-19 NOTE — Assessment & Plan Note (Signed)
Multifactorial- a suspicious progressive disease. Recommend increased caloric intake.

## 2015-10-19 NOTE — Telephone Encounter (Signed)
Call patient with PSA results when available.

## 2015-10-19 NOTE — Progress Notes (Signed)
[place] OFFICE PROGRESS NOTE  Patient Care Team: Lenard Simmer, MD as PCP - General (Endocrinology)  No matching staging information was found for the patient.   Oncology History   1. Carcinoma of prostate diagnosis in 2006.  Had received radiation therapy, external beam to prostate and pelvic lymph nodes.  Had Gleason 9 (4+5.)  PainBaseline PSA was 13. 2. Received Lupron injection after her radiation therapy also.  PSA was going up.  Last year or so received Lupron injection in September but did not have any followup.   3. Recently (this August, 2012) PSA was found to be 17. Patient was started on Lupron from September of 2012. 4. Patient has progressive disease by PSA criteria (October, 2013) 5. Patient was started on Zytiga and prednisone in November of 2013. 6.traumatic hip fracture ((right femur) status post internally fixed ,no evidence of metastatic disease(May of 2014) 7.bone scan in July of 2014 at outside institutions shows metastases to left scapula and left 11th rib/.. 8.progressive On ZYTIGA by PSA criteria.  9[name], September of 2015- Progression- Jan 2016     Prostate cancer metastatic to bone [facility])   10/19/2015 Initial Diagnosis Prostate cancer metastatic to bone [facility])       INTERVAL HISTORY:  This is my first interaction with the patient; Patient's previous treating oncologist was Dr. Oliva Bustard.  I reviewed the patient's prior charts/pertinent labs/imaging in detail; findings are summarized above.   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  Patient is a poor appetite. He has lost about 5 pounds since last visit. Denies any chest pain or shortness of breath or cough. Overall feels fatigued. He denies any pain he anywhere. He is not on any pain medications. Does complain of intermittent hot flashes.   REVIEW OF SYSTEMS:  A complete 10  point review of system is done which is negative except mentioned above/history of present illness.   PAST MEDICAL HISTORY :  Past Medical History  Diagnosis Date  . Prostate cancer ([place])   . Osteomyelitis ([place])   . Hypocalcemia   . Hyperlipidemia   . Heart murmur   . SOB (shortness of breath)   . Asthma     PAST SURGICAL HISTORY :   Past Surgical History  Procedure Laterality Date  . Excisional hemorrhoidectomy    . Tonsillectomy    . Colonoscopy with propofol N/A 04/12/2015    Procedure: COLONOSCOPY WITH PROPOFOL;  Surgeon: Lollie Sails, MD;  Location: [facility] ENDOSCOPY;  Service: Endoscopy;  Laterality: N/A;    FAMILY HISTORY :  No family history on file.  SOCIAL HISTORY:   Social History  Substance Use Topics  . Smoking status: Current Every Day Smoker  . Smokeless tobacco: Never Used  . Alcohol Use: No    ALLERGIES:  has No Known Allergies.  MEDICATIONS:  Current Outpatient Prescriptions  Medication Sig Dispense Refill  . ADVAIR DISKUS 100-50 MCG/DOSE AEPB inhale 1 dose by mouth twice a day  0  . calcium-vitamin D (OSCAL WITH D) 500-200 MG-UNIT per tablet Take 1 tablet by mouth.    . Multiple Vitamin (MULTIVITAMIN) tablet Take 1 tablet by mouth daily.    . pravastatin (PRAVACHOL) 20 MG tablet Take 20 mg by mouth daily.    . Vitamin D, Ergocalciferol, (DRISDOL) 50000 UNITS CAPS capsule Take 50,000 Units by mouth once a week.  0   No current facility-administered  medications for this visit.    PHYSICAL EXAMINATION: ECOG PERFORMANCE STATUS: 1 - Symptomatic but completely ambulatory  BP 127/77 mmHg  Pulse 88  Temp(Src) 98.2 F (36.8 C) (Tympanic)  Resp 18  Wt 166 lb 10.7 oz (75.6 kg)  Filed Weights   10/19/15 1403  Weight: 166 lb 10.7 oz (75.6 kg)    GENERAL: Well-nourished well-developed; Alert, no distress and comfortable.   Accompanied by family. EYES: no pallor or icterus OROPHARYNX: no thrush or ulceration; good dentition  NECK: supple, no  masses felt LYMPH:  no palpable lymphadenopathy in the cervical, axillary or inguinal regions LUNGS: clear to auscultation and  No wheeze or crackles HEART/CVS: regular rate & rhythm and no murmurs; No lower extremity edema ABDOMEN:abdomen soft, non-tender and normal bowel sounds Musculoskeletal:no cyanosis of digits and no clubbing  PSYCH: alert & oriented x 3 with fluent speech NEURO: no focal motor/sensory deficits SKIN:  no rashes or significant lesions  LABORATORY DATA:  I have reviewed the data as listed    Component Value Date/Time   NA 136 10/19/2015 1347   NA 138 08/25/2014 1442   K 3.8 10/19/2015 1347   K 4.1 08/25/2014 1442   CL 103 10/19/2015 1347   CL 106 08/25/2014 1442   CO2 25 10/19/2015 1347   CO2 25 08/25/2014 1442   GLUCOSE 147* 10/19/2015 1347   GLUCOSE 138* 08/25/2014 1442   BUN 16 10/19/2015 1347   BUN 19 08/25/2014 1442   CREATININE 0.72 10/19/2015 1347   CREATININE 0.53* 08/25/2014 1442   CALCIUM 9.4 10/19/2015 1347   CALCIUM 9.0 08/25/2014 1442   PROT 7.3 10/19/2015 1347   PROT 6.8 08/25/2014 1442   ALBUMIN 4.2 10/19/2015 1347   ALBUMIN 3.9 08/25/2014 1442   AST 21 10/19/2015 1347   AST 18 08/25/2014 1442   ALT 15* 10/19/2015 1347   ALT 13* 08/25/2014 1442   ALKPHOS 67 10/19/2015 1347   ALKPHOS 73 08/25/2014 1442   BILITOT 0.8 10/19/2015 1347   BILITOT 0.7 08/25/2014 1442   GFRNONAA >60 10/19/2015 1347   GFRNONAA >60 08/25/2014 1442   GFRNONAA >60 04/26/2014 1055   GFRAA >60 10/19/2015 1347   GFRAA >60 08/25/2014 1442   GFRAA >60 04/26/2014 1055    No results found for: SPEP, UPEP  Lab Results  Component Value Date   WBC 6.5 10/19/2015   NEUTROABS 3.6 10/19/2015   HGB 12.4* 10/19/2015   HCT 36.0* 10/19/2015   MCV 91.0 10/19/2015   PLT 187 10/19/2015      Chemistry      Component Value Date/Time   NA 136 10/19/2015 1347   NA 138 08/25/2014 1442   K 3.8 10/19/2015 1347   K 4.1 08/25/2014 1442   CL 103 10/19/2015 1347   CL  106 08/25/2014 1442   CO2 25 10/19/2015 1347   CO2 25 08/25/2014 1442   BUN 16 10/19/2015 1347   BUN 19 08/25/2014 1442   CREATININE 0.72 10/19/2015 1347   CREATININE 0.53* 08/25/2014 1442      Component Value Date/Time   CALCIUM 9.4 10/19/2015 1347   CALCIUM 9.0 08/25/2014 1442   ALKPHOS 67 10/19/2015 1347   ALKPHOS 73 08/25/2014 1442   AST 21 10/19/2015 1347   AST 18 08/25/2014 1442   ALT 15* 10/19/2015 1347   ALT 13* 08/25/2014 1442   BILITOT 0.8 10/19/2015 1347   BILITOT 0.7 08/25/2014 1442         ASSESSMENT & PLAN:  Prostate cancer metastatic  to bone ([place]) 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 Patient has been taken off Xtandi in January 2017 as noted based on elevated PSA. May 2017 CT scan shows progressive lesions in the bone and no evidence of any visceral metastases. Most recent PSA in month ago showed 24. PSA from today is pending. Long discussion the patient and family regarding the next treatment options- including #1 Provenge #2 docetaxel chemotherapy # 3 Xofigo. Discussed the pros and cons of each treatment option. Family patient interested in Provenge therapy. Given the absence of any visceral metastases; presence of bone only metastases- not symptomatic /not using narcotic pain medication - patient should be a good candidate for Provenge therapy.  We'll initiate insurance approval.   Weight loss, abnormal Multifactorial- a suspicious progressive disease. Recommend increased caloric intake.  Bone metastases ([place]) Continue Xgeva on a monthly basis. No side effects noted. Calcium within normal limits.   # Patient follow-up with me in approximately 4 weeks/CBC CMP for Lupron/Xgeva in 4 weeks. However he'll follow up with me sooner if he gets approval for Provenge therapy.  No orders of the defined types were placed in this encounter.   All questions were answered. The patient knows to call the clinic with any problems,  questions or concerns.      Cammie Sickle, MD 10/19/2015 6:20 PM    Cammie Sickle, MD   10/19/2015 6:20 PM

## 2015-10-19 NOTE — Assessment & Plan Note (Signed)
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 Patient has been taken off Xtandi in January 2017 as noted based on elevated PSA. May 2017 CT scan shows progressive lesions in the bone and no evidence of any visceral metastases. Most recent PSA in month ago showed 24. PSA from today is pending. Long discussion the patient and family regarding the next treatment options- including #1 Provenge #2 docetaxel chemotherapy # 3 Xofigo. Discussed the pros and cons of each treatment option. Family patient interested in Provenge therapy. Given the absence of any visceral metastases; presence of bone only metastases- not symptomatic /not using narcotic pain medication - patient should be a good candidate for Provenge therapy.  We'll initiate Biochemist, clinical.

## 2015-10-21 NOTE — Telephone Encounter (Signed)
Called patient and spoke to wife regarding PSA levels.  (43.79).  Verbalized understanding.

## 2015-10-25 ENCOUNTER — Other Ambulatory Visit: Payer: Self-pay | Admitting: Internal Medicine

## 2015-11-07 ENCOUNTER — Telehealth: Payer: Self-pay | Admitting: *Deleted

## 2015-11-07 NOTE — Telephone Encounter (Signed)
Patient and wife wanting to know if we know if insurance has been approved for new treatment and if so what meds will patient be getting.

## 2015-11-09 ENCOUNTER — Telehealth: Payer: Self-pay | Admitting: *Deleted

## 2015-11-09 NOTE — Telephone Encounter (Signed)
Left msg for patient's wife. Dr. B would like to discuss Provenge therapy with patient/patient's wife. I contacted patient to see if they could at 215 pm tomorrow to discuss treatment options and the enrollment process with MD/RN/prior auth team.

## 2015-11-10 ENCOUNTER — Inpatient Hospital Stay: Payer: Medicare Other | Attending: Internal Medicine | Admitting: Internal Medicine

## 2015-11-10 VITALS — BP 144/89 | HR 76 | Temp 98.8°F | Resp 18 | Wt 171.1 lb

## 2015-11-10 DIAGNOSIS — M869 Osteomyelitis, unspecified: Secondary | ICD-10-CM | POA: Insufficient documentation

## 2015-11-10 DIAGNOSIS — C61 Malignant neoplasm of prostate: Secondary | ICD-10-CM | POA: Insufficient documentation

## 2015-11-10 DIAGNOSIS — R5383 Other fatigue: Secondary | ICD-10-CM | POA: Diagnosis not present

## 2015-11-10 DIAGNOSIS — C7951 Secondary malignant neoplasm of bone: Secondary | ICD-10-CM | POA: Diagnosis not present

## 2015-11-10 DIAGNOSIS — R011 Cardiac murmur, unspecified: Secondary | ICD-10-CM | POA: Insufficient documentation

## 2015-11-10 DIAGNOSIS — Z923 Personal history of irradiation: Secondary | ICD-10-CM | POA: Diagnosis not present

## 2015-11-10 DIAGNOSIS — E785 Hyperlipidemia, unspecified: Secondary | ICD-10-CM

## 2015-11-10 DIAGNOSIS — R0602 Shortness of breath: Secondary | ICD-10-CM | POA: Insufficient documentation

## 2015-11-10 DIAGNOSIS — J45909 Unspecified asthma, uncomplicated: Secondary | ICD-10-CM | POA: Diagnosis not present

## 2015-11-10 DIAGNOSIS — F17210 Nicotine dependence, cigarettes, uncomplicated: Secondary | ICD-10-CM | POA: Insufficient documentation

## 2015-11-10 DIAGNOSIS — Z79899 Other long term (current) drug therapy: Secondary | ICD-10-CM | POA: Insufficient documentation

## 2015-11-10 NOTE — Progress Notes (Signed)
Rn spent 2 hrs with patient education on Provenge. Pt and pt's wife present for discussion. MD discussed the purpose/role of Provenge and side effects of Provenge.  Enrollment form completed. Pt will need pt financial assistance. Pt educated on the steps to set up provenge. He understand that he will need an apheresis catheter due to poor venous access and need for rapid flow rates.  He understands that the apheresis catheter will be coordinated with vascular services. I contacted Dionne Milo in Dr. Dew's/Schneier's office. Dionne Milo will coordinate this apt request with her vascular physicians. After the application form is submitted to [facility], the patient will be contacted with future appointments.  I explained to the patient that his blood would be collected from his body and passed through a machine that collects a small portion of his immune cells, along with some platelets and a small number of red blood cells.  Educated pt that his complete blood counts needs to be drawn a least 30 days prior to leukophoresis collection. He may need to come again in the next few weeks as his cbc will be out of date.  Pt gave verbal understanding.  Educated that this machine returns the rest of the cells and blood to your body. I explained to the patient that this collection process is called leukapheresis. This process could takes 3 to 4 hours or longer.  [facility] to collect his blood.  After his blood in collected the specimen in shipped to [facility].  I explained that each dose of PROVENGE is givenabout 3 days after the blood cell collection procedure, and this infusion appointment would take about 2+hours. He will be kept after the procedure for approximately 30 minutes + to ensure that he does not have any infusion reactions. Educated patient on the symptoms of infusion reaction.  Spoke with Deedra Ehrich in cancer center pharmacy.  Andree Elk will be setting up the [facility] as an infusion site for Provenge.  Application can not be faxed until this process is complete.  Wife expressed concern that patient will be out of town August 25 through the first week of September. He wants to be completed with his provenge by this date.  I reiterated the to the patient and his wife that Provenge will need to be coordinated. At this time, I do not have the exact infusion/treatment dates. I explained that once provenge, he should not miss any doses. This is a consecutive tx every 2 weeks.  Pt and his wife will be contacted as soon as the application has been processed.  Teach back process performed. With patient and pt's wife.   Teach back process performed.

## 2015-11-10 NOTE — Progress Notes (Signed)
[place] OFFICE PROGRESS NOTE  Patient Care Team: Lenard Simmer, MD as PCP - General (Endocrinology)  No matching staging information was found for the patient.   Oncology History   1. Carcinoma of prostate diagnosis in 2006.  Had received radiation therapy, external beam to prostate and pelvic lymph nodes.  Had Gleason 9 (4+5.)  PainBaseline PSA was 13. 2. Received Lupron injection after her radiation therapy also.  PSA was going up.  Last year or so received Lupron injection in September but did not have any followup.   3. Recently (this August, 2012) PSA was found to be 17. Patient was started on Lupron from September of 2012. 4. Patient has progressive disease by PSA criteria (October, 2013) 5. Patient was started on Zytiga and prednisone in November of 2013. 6.traumatic hip fracture ((right femur) status post internally fixed ,no evidence of metastatic disease(May of 2014) 7.bone scan in July of 2014 at outside institutions shows metastases to left scapula and left 11th rib/.. 8.progressive On ZYTIGA by PSA criteria.  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    Denies any chest pain or shortness of breath or cough. Overall feels fatigued. He denies any pain he anywhere. He is not on any pain medications. Does complain of intermittent hot flashes.   REVIEW OF SYSTEMS:  A complete 10 point review of system is done which is negative except mentioned above/history of present illness.   PAST MEDICAL HISTORY :  Past Medical History  Diagnosis Date  . Prostate cancer ([place])   . Osteomyelitis ([place])   . Hypocalcemia   .  Hyperlipidemia   . Heart murmur   . SOB (shortness of breath)   . Asthma     PAST SURGICAL HISTORY :   Past Surgical History  Procedure Laterality Date  . Excisional hemorrhoidectomy    . Tonsillectomy    . Colonoscopy with propofol N/A 04/12/2015    Procedure: COLONOSCOPY WITH PROPOFOL;  Surgeon: Lollie Sails, MD;  Location: [facility] ENDOSCOPY;  Service: Endoscopy;  Laterality: N/A;    FAMILY HISTORY :  No family history on file.  SOCIAL HISTORY:   Social History  Substance Use Topics  . Smoking status: Current Every Day Smoker  . Smokeless tobacco: Never Used  . Alcohol Use: No    ALLERGIES:  has No Known Allergies.  MEDICATIONS:  Current Outpatient Prescriptions  Medication Sig Dispense Refill  . ADVAIR DISKUS 100-50 MCG/DOSE AEPB inhale 1 dose by mouth twice a day  0  . calcium-vitamin D (OSCAL WITH D) 500-200 MG-UNIT per tablet Take 1 tablet by mouth.    . Multiple Vitamin (MULTIVITAMIN) tablet Take 1 tablet by mouth daily.    . pravastatin (PRAVACHOL) 20 MG tablet Take 20 mg by mouth daily.    . Vitamin D, Ergocalciferol, (DRISDOL) 50000 UNITS CAPS capsule Take 50,000 Units by mouth once a week.  0   No current facility-administered medications for this visit.    PHYSICAL EXAMINATION: ECOG PERFORMANCE STATUS: 1 - Symptomatic but completely ambulatory  BP 144/89 mmHg  Pulse 76  Temp(Src) 98.8 F (37.1 C) (Tympanic)  Resp 18  Wt 171  lb 1.2 oz (77.6 kg)  Filed Weights   11/10/15 1424  Weight: 171 lb 1.2 oz (77.6 kg)    GENERAL: Well-nourished well-developed; Alert, no distress and comfortable.   Accompanied by family. EYES: no pallor or icterus OROPHARYNX: no thrush or ulceration; good dentition  NECK: supple, no masses felt LYMPH:  no palpable lymphadenopathy in the cervical, axillary or inguinal regions LUNGS: clear to auscultation and  No wheeze or crackles HEART/CVS: regular rate & rhythm and no murmurs; No lower extremity edema ABDOMEN:abdomen  soft, non-tender and normal bowel sounds Musculoskeletal:no cyanosis of digits and no clubbing  PSYCH: alert & oriented x 3 with fluent speech NEURO: no focal motor/sensory deficits SKIN:  no rashes or significant lesions  LABORATORY DATA:  I have reviewed the data as listed    Component Value Date/Time   NA 136 10/19/2015 1347   NA 138 08/25/2014 1442   K 3.8 10/19/2015 1347   K 4.1 08/25/2014 1442   CL 103 10/19/2015 1347   CL 106 08/25/2014 1442   CO2 25 10/19/2015 1347   CO2 25 08/25/2014 1442   GLUCOSE 147* 10/19/2015 1347   GLUCOSE 138* 08/25/2014 1442   BUN 16 10/19/2015 1347   BUN 19 08/25/2014 1442   CREATININE 0.72 10/19/2015 1347   CREATININE 0.53* 08/25/2014 1442   CALCIUM 9.4 10/19/2015 1347   CALCIUM 9.0 08/25/2014 1442   PROT 7.3 10/19/2015 1347   PROT 6.8 08/25/2014 1442   ALBUMIN 4.2 10/19/2015 1347   ALBUMIN 3.9 08/25/2014 1442   AST 21 10/19/2015 1347   AST 18 08/25/2014 1442   ALT 15* 10/19/2015 1347   ALT 13* 08/25/2014 1442   ALKPHOS 67 10/19/2015 1347   ALKPHOS 73 08/25/2014 1442   BILITOT 0.8 10/19/2015 1347   BILITOT 0.7 08/25/2014 1442   GFRNONAA >60 10/19/2015 1347   GFRNONAA >60 08/25/2014 1442   GFRNONAA >60 04/26/2014 1055   GFRAA >60 10/19/2015 1347   GFRAA >60 08/25/2014 1442   GFRAA >60 04/26/2014 1055    No results found for: SPEP, UPEP  Lab Results  Component Value Date   WBC 6.5 10/19/2015   NEUTROABS 3.6 10/19/2015   HGB 12.4* 10/19/2015   HCT 36.0* 10/19/2015   MCV 91.0 10/19/2015   PLT 187 10/19/2015      Chemistry      Component Value Date/Time   NA 136 10/19/2015 1347   NA 138 08/25/2014 1442   K 3.8 10/19/2015 1347   K 4.1 08/25/2014 1442   CL 103 10/19/2015 1347   CL 106 08/25/2014 1442   CO2 25 10/19/2015 1347   CO2 25 08/25/2014 1442   BUN 16 10/19/2015 1347   BUN 19 08/25/2014 1442   CREATININE 0.72 10/19/2015 1347   CREATININE 0.53* 08/25/2014 1442      Component Value Date/Time   CALCIUM 9.4  10/19/2015 1347   CALCIUM 9.0 08/25/2014 1442   ALKPHOS 67 10/19/2015 1347   ALKPHOS 73 08/25/2014 1442   AST 21 10/19/2015 1347   AST 18 08/25/2014 1442   ALT 15* 10/19/2015 1347   ALT 13* 08/25/2014 1442   BILITOT 0.8 10/19/2015 1347   BILITOT 0.7 08/25/2014 1442         ASSESSMENT & PLAN:  Prostate cancer metastatic to bone ([place]) 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 Patient has been taken off Xtandi in January 2017 as noted based on elevated PSA. May 2017 CT scan shows progressive lesions in the bone  and no evidence of any visceral metastases.  PSA- 44- in June 2017.  # Patient is interested in Provenge- discussed the logistics and the potential side effects in detail.   #Patient will likely need a catheter for pheresis.   # Follow-up will be decided based upon the initiation/planning the treatment.  # 40 minutes face-to-face with the patient discussing the above plan of care; more than 50% of time spent on prognosis/ natural history; counseling and coordination.     No orders of the defined types were placed in this encounter.   All questions were answered. The patient knows to call the clinic with any problems, questions or concerns.      Cammie Sickle, MD 11/10/2015 6:45 PM    Cammie Sickle, MD   11/10/2015 6:45 PM

## 2015-11-10 NOTE — Assessment & Plan Note (Addendum)
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 Patient has been taken off Xtandi in January 2017 as noted based on elevated PSA. May 2017 CT scan shows progressive lesions in the bone and no evidence of any visceral metastases.  PSA- 55- in June 2017.  # Patient is interested in Provenge- discussed the logistics and the potential side effects in detail.   #Patient will likely need a catheter for pheresis.   # Follow-up will be decided based upon the initiation/planning the treatment.  # 40 minutes face-to-face with the patient discussing the above plan of care; more than 50% of time spent on prognosis/ natural history; counseling and coordination.

## 2015-11-14 ENCOUNTER — Telehealth: Payer: Self-pay | Admitting: *Deleted

## 2015-11-14 NOTE — Telephone Encounter (Signed)
Called [facility] to cancel placement of apheresis catheter, due to treatment delay.   Patient also notified.

## 2015-11-14 NOTE — Telephone Encounter (Signed)
RN contacted patient's wife to update patient on Provenge application status. RN explained [place] site for provenge therapy. I explained that our pharmacist has reapplied for infusion site privileges. Once this privilege has been obtained, then the application for Provenge could then be submitted.  I informed pt's wife that the Apheresis cath placement has been cnl until approval of his Provenge. The wife expressed concerns of delays in starting Provenge. She is very anxious about starting the treatment as they have a family trip coming up at the end of August. She stated that she doesn't want her husband to miss any Provenge treatments due to this important trip.  She inquired if [facility] could accommodate with the Provenge therapy. I explained that I would reach out our lead pharmacy director, Deedra Ehrich to determine if [facility] is a tx site.  Msg sent to [place]. Will wait on this information before contacting wife back.

## 2015-11-15 ENCOUNTER — Other Ambulatory Visit: Payer: Self-pay | Admitting: *Deleted

## 2015-11-15 DIAGNOSIS — C61 Malignant neoplasm of prostate: Secondary | ICD-10-CM

## 2015-11-15 DIAGNOSIS — C7951 Secondary malignant neoplasm of bone: Principal | ICD-10-CM

## 2015-11-16 ENCOUNTER — Ambulatory Visit: Payer: Self-pay | Admitting: Internal Medicine

## 2015-11-16 ENCOUNTER — Telehealth: Payer: Self-pay

## 2015-11-16 ENCOUNTER — Ambulatory Visit: Admission: RE | Admit: 2015-11-16 | Payer: Medicare Other | Source: Ambulatory Visit | Admitting: Vascular Surgery

## 2015-11-16 ENCOUNTER — Ambulatory Visit: Payer: Self-pay

## 2015-11-16 ENCOUNTER — Other Ambulatory Visit: Payer: Self-pay

## 2015-11-16 ENCOUNTER — Encounter: Admission: RE | Payer: Self-pay | Source: Ambulatory Visit

## 2015-11-16 SURGERY — DIALYSIS/PERMA CATHETER INSERTION
Anesthesia: Moderate Sedation

## 2015-11-16 NOTE — Telephone Encounter (Signed)
Patient's wife called to check on the status of [place] ability to infuse Provenge.  Patient informed that [place] was no longer an approved site to infuse this treatment and that attempts have been made to contact the company to re-enroll but company has not called back.  Pharmacy will continue to try and contact company to resolve this issue.  Patient will discuss more options with Dr. Rogue Bussing at [place] appointment.

## 2015-11-17 ENCOUNTER — Inpatient Hospital Stay: Payer: Medicare Other

## 2015-11-17 ENCOUNTER — Ambulatory Visit: Payer: Self-pay

## 2015-11-17 ENCOUNTER — Telehealth: Payer: Self-pay

## 2015-11-17 ENCOUNTER — Inpatient Hospital Stay (HOSPITAL_BASED_OUTPATIENT_CLINIC_OR_DEPARTMENT_OTHER): Payer: Medicare Other | Admitting: Internal Medicine

## 2015-11-17 VITALS — BP 113/70 | HR 79 | Temp 98.5°F | Resp 18 | Wt 171.3 lb

## 2015-11-17 DIAGNOSIS — C61 Malignant neoplasm of prostate: Secondary | ICD-10-CM

## 2015-11-17 DIAGNOSIS — M869 Osteomyelitis, unspecified: Secondary | ICD-10-CM | POA: Diagnosis not present

## 2015-11-17 DIAGNOSIS — J45909 Unspecified asthma, uncomplicated: Secondary | ICD-10-CM

## 2015-11-17 DIAGNOSIS — C7951 Secondary malignant neoplasm of bone: Secondary | ICD-10-CM | POA: Diagnosis not present

## 2015-11-17 DIAGNOSIS — R5383 Other fatigue: Secondary | ICD-10-CM

## 2015-11-17 DIAGNOSIS — F17210 Nicotine dependence, cigarettes, uncomplicated: Secondary | ICD-10-CM

## 2015-11-17 DIAGNOSIS — E785 Hyperlipidemia, unspecified: Secondary | ICD-10-CM

## 2015-11-17 DIAGNOSIS — R0602 Shortness of breath: Secondary | ICD-10-CM

## 2015-11-17 DIAGNOSIS — Z79899 Other long term (current) drug therapy: Secondary | ICD-10-CM

## 2015-11-17 DIAGNOSIS — Z923 Personal history of irradiation: Secondary | ICD-10-CM

## 2015-11-17 DIAGNOSIS — R011 Cardiac murmur, unspecified: Secondary | ICD-10-CM

## 2015-11-17 LAB — COMPREHENSIVE METABOLIC PANEL
ALK PHOS: 80 U/L (ref 38–126)
ALT: 14 U/L — AB (ref 17–63)
AST: 19 U/L (ref 15–41)
Albumin: 4.1 g/dL (ref 3.5–5.0)
Anion gap: 5 (ref 5–15)
BUN: 15 mg/dL (ref 6–20)
CHLORIDE: 105 mmol/L (ref 101–111)
CO2: 25 mmol/L (ref 22–32)
CREATININE: 0.6 mg/dL — AB (ref 0.61–1.24)
Calcium: 9 mg/dL (ref 8.9–10.3)
Glucose, Bld: 113 mg/dL — ABNORMAL HIGH (ref 65–99)
POTASSIUM: 4.3 mmol/L (ref 3.5–5.1)
Sodium: 135 mmol/L (ref 135–145)
Total Bilirubin: 0.6 mg/dL (ref 0.3–1.2)
Total Protein: 7 g/dL (ref 6.5–8.1)

## 2015-11-17 LAB — CBC WITH DIFFERENTIAL/PLATELET
BASOS PCT: 1 %
Basophils Absolute: 0 10*3/uL (ref 0–0.1)
EOS PCT: 1 %
Eosinophils Absolute: 0.1 10*3/uL (ref 0–0.7)
HEMATOCRIT: 33.2 % — AB (ref 40.0–52.0)
Hemoglobin: 11.4 g/dL — ABNORMAL LOW (ref 13.0–18.0)
Lymphocytes Relative: 41 %
Lymphs Abs: 2 10*3/uL (ref 1.0–3.6)
MCH: 31.4 pg (ref 26.0–34.0)
MCHC: 34.4 g/dL (ref 32.0–36.0)
MCV: 91.4 fL (ref 80.0–100.0)
MONO ABS: 0.4 10*3/uL (ref 0.2–1.0)
MONOS PCT: 8 %
NEUTROS ABS: 2.4 10*3/uL (ref 1.4–6.5)
Neutrophils Relative %: 49 %
PLATELETS: 180 10*3/uL (ref 150–440)
RBC: 3.63 MIL/uL — ABNORMAL LOW (ref 4.40–5.90)
RDW: 14.9 % — AB (ref 11.5–14.5)
WBC: 4.8 10*3/uL (ref 3.8–10.6)

## 2015-11-17 LAB — PSA: PSA: 64.75 ng/mL — AB (ref 0.00–4.00)

## 2015-11-17 MED ORDER — DENOSUMAB 120 MG/1.7ML ~~LOC~~ SOLN
120.0000 mg | Freq: Once | SUBCUTANEOUS | Status: AC
  Administered 2015-11-17: 120 mg via SUBCUTANEOUS
  Filled 2015-11-17: qty 1.7

## 2015-11-17 NOTE — Assessment & Plan Note (Addendum)
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  May 2017 CT scan shows progressive lesions in the bone and no evidence of any visceral metastases.  PSA- 64- in June 2017.  # again discussed the treatment options include Provenge; Xofigo; Taxotere- discussed pros and cons of each treatment. Patient is interested in Provenge- discussed the logistics and the potential side effects in detail. Patient will likely need a catheter for pheresis.  Discussed- that as soon as we get the logistics from the company- will proceed forward with planning. I again reminded the patient to save time to avoid conflict as scheduled with his vacation.   # follow-up in one month/with labs  # 25 minutes face-to-face with the patient discussing the above plan of care; more than 50% of time spent on prognosis/ natural history; counseling and coordination.

## 2015-11-17 NOTE — Telephone Encounter (Signed)
Wife stated that she was able to contact drug company regarding Provenge.  She stated that the contact number had changed to [phone]

## 2015-11-17 NOTE — Telephone Encounter (Signed)
Discussed care with patient and patient's wife today while in clinic.

## 2015-11-17 NOTE — Progress Notes (Signed)
[place] OFFICE PROGRESS NOTE  Patient Care Team: Lenard Simmer, MD as PCP - General (Endocrinology)  No matching staging information was found for the patient.   Oncology History   1. Carcinoma of prostate diagnosis in 2006.  Had received radiation therapy, external beam to prostate and pelvic lymph nodes.  Had Gleason 9 (4+5.)  PainBaseline PSA was 13. 2. Received Lupron injection after her radiation therapy also.  PSA was going up.  Last year or so received Lupron injection in September but did not have any followup.   3. Recently (this August, 2012) PSA was found to be 17. Patient was started on Lupron from September of 2012. 4. Patient has progressive disease by PSA criteria (October, 2013) 5. Patient was started on Zytiga and prednisone in November of 2013. 6.traumatic hip fracture ((right femur) status post internally fixed ,no evidence of metastatic disease(May of 2014) 7.bone scan in July of 2014 at outside institutions shows metastases to left scapula and left 11th rib/.. 8.progressive On ZYTIGA by PSA criteria.  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  Continues to deny any abdominal pain. Denies any nausea vomiting.   Denies any chest pain or shortness of breath or cough. Overall feels fatigued. He denies any pain he anywhere. He is not on any pain medications. Does complain of intermittent hot flashes.   REVIEW OF SYSTEMS:  A complete 10 point review of system is done which is negative except mentioned above/history of present illness.   PAST MEDICAL HISTORY :  Past Medical History  Diagnosis Date  . Prostate  cancer ([place])   . Osteomyelitis ([place])   . Hypocalcemia   . Hyperlipidemia   . Heart murmur   . SOB (shortness of breath)   . Asthma     PAST SURGICAL HISTORY :   Past Surgical History  Procedure Laterality Date  . Excisional hemorrhoidectomy    . Tonsillectomy    . Colonoscopy with propofol N/A 04/12/2015    Procedure: COLONOSCOPY WITH PROPOFOL;  Surgeon: Lollie Sails, MD;  Location: [facility] ENDOSCOPY;  Service: Endoscopy;  Laterality: N/A;    FAMILY HISTORY :  No family history on file.  SOCIAL HISTORY:   Social History  Substance Use Topics  . Smoking status: Current Every Day Smoker  . Smokeless tobacco: Never Used  . Alcohol Use: No    ALLERGIES:  has No Known Allergies.  MEDICATIONS:  Current Outpatient Prescriptions  Medication Sig Dispense Refill  . ADVAIR DISKUS 100-50 MCG/DOSE AEPB inhale 1 dose by mouth twice a day  0  . calcium-vitamin D (OSCAL WITH D) 500-200 MG-UNIT per tablet Take 1 tablet by mouth daily with breakfast.     . denosumab (XGEVA) 120 MG/1.7ML SOLN injection Inject 120 mg into the skin every 30 (thirty) days.    Marland Kitchen Leuprolide Acetate (LUPRON IJ) Inject 1 mL as directed every 3 (three) months.    . Multiple Vitamin (MULTIVITAMIN) tablet Take 1 tablet by mouth daily.    . pravastatin (PRAVACHOL) 20 MG tablet Take 20 mg by mouth daily.    . Vitamin D, Ergocalciferol, (DRISDOL) 50000 UNITS CAPS capsule Take 50,000  Units by mouth once a week. Fridays  0   No current facility-administered medications for this visit.    PHYSICAL EXAMINATION: ECOG PERFORMANCE STATUS: 1 - Symptomatic but completely ambulatory  BP 113/70 mmHg  Pulse 79  Temp(Src) 98.5 F (36.9 C) (Tympanic)  Resp 18  Wt 171 lb 4.8 oz (77.7 kg)  Filed Weights   11/17/15 1423  Weight: 171 lb 4.8 oz (77.7 kg)    GENERAL: Well-nourished well-developed; Alert, no distress and comfortable.   Accompanied by family. EYES: no pallor or icterus OROPHARYNX: no thrush or ulceration;  good dentition  NECK: supple, no masses felt LYMPH:  no palpable lymphadenopathy in the cervical, axillary or inguinal regions LUNGS: clear to auscultation and  No wheeze or crackles HEART/CVS: regular rate & rhythm and no murmurs; No lower extremity edema ABDOMEN:abdomen soft, non-tender and normal bowel sounds Musculoskeletal:no cyanosis of digits and no clubbing  PSYCH: alert & oriented x 3 with fluent speech NEURO: no focal motor/sensory deficits SKIN:  no rashes or significant lesions  LABORATORY DATA:  I have reviewed the data as listed    Component Value Date/Time   NA 135 11/17/2015 1335   NA 138 08/25/2014 1442   K 4.3 11/17/2015 1335   K 4.1 08/25/2014 1442   CL 105 11/17/2015 1335   CL 106 08/25/2014 1442   CO2 25 11/17/2015 1335   CO2 25 08/25/2014 1442   GLUCOSE 113* 11/17/2015 1335   GLUCOSE 138* 08/25/2014 1442   BUN 15 11/17/2015 1335   BUN 19 08/25/2014 1442   CREATININE 0.60* 11/17/2015 1335   CREATININE 0.53* 08/25/2014 1442   CALCIUM 9.0 11/17/2015 1335   CALCIUM 9.0 08/25/2014 1442   PROT 7.0 11/17/2015 1335   PROT 6.8 08/25/2014 1442   ALBUMIN 4.1 11/17/2015 1335   ALBUMIN 3.9 08/25/2014 1442   AST 19 11/17/2015 1335   AST 18 08/25/2014 1442   ALT 14* 11/17/2015 1335   ALT 13* 08/25/2014 1442   ALKPHOS 80 11/17/2015 1335   ALKPHOS 73 08/25/2014 1442   BILITOT 0.6 11/17/2015 1335   BILITOT 0.7 08/25/2014 1442   GFRNONAA >60 11/17/2015 1335   GFRNONAA >60 08/25/2014 1442   GFRNONAA >60 04/26/2014 1055   GFRAA >60 11/17/2015 1335   GFRAA >60 08/25/2014 1442   GFRAA >60 04/26/2014 1055    No results found for: SPEP, UPEP  Lab Results  Component Value Date   WBC 4.8 11/17/2015   NEUTROABS 2.4 11/17/2015   HGB 11.4* 11/17/2015   HCT 33.2* 11/17/2015   MCV 91.4 11/17/2015   PLT 180 11/17/2015      Chemistry      Component Value Date/Time   NA 135 11/17/2015 1335   NA 138 08/25/2014 1442   K 4.3 11/17/2015 1335   K 4.1 08/25/2014 1442    CL 105 11/17/2015 1335   CL 106 08/25/2014 1442   CO2 25 11/17/2015 1335   CO2 25 08/25/2014 1442   BUN 15 11/17/2015 1335   BUN 19 08/25/2014 1442   CREATININE 0.60* 11/17/2015 1335   CREATININE 0.53* 08/25/2014 1442      Component Value Date/Time   CALCIUM 9.0 11/17/2015 1335   CALCIUM 9.0 08/25/2014 1442   ALKPHOS 80 11/17/2015 1335   ALKPHOS 73 08/25/2014 1442   AST 19 11/17/2015 1335   AST 18 08/25/2014 1442   ALT 14* 11/17/2015 1335   ALT 13* 08/25/2014 1442   BILITOT 0.6 11/17/2015 1335   BILITOT 0.7 08/25/2014 1442  ASSESSMENT & PLAN:  Prostate cancer metastatic to bone ([place]) 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  May 2017 CT scan shows progressive lesions in the bone and no evidence of any visceral metastases.  PSA- 54- in June 2017.  # again discussed the treatment options include Provenge; Xofigo; Taxotere- discussed pros and cons of each treatment. Patient is interested in Provenge- discussed the logistics and the potential side effects in detail. Patient will likely need a catheter for pheresis.  Discussed- that as soon as we get the logistics from the company- will proceed forward with planning. I again reminded the patient to save time to avoid conflict as scheduled with his vacation.   # follow-up in one month/with labs  # 25 minutes face-to-face with the patient discussing the above plan of care; more than 50% of time spent on prognosis/ natural history; counseling and coordination.    Orders Placed This Encounter  Procedures  . CBC with Differential    Standing Status: Future     Number of Occurrences:      Standing Expiration Date: 11/16/2016  . Comprehensive metabolic panel    Standing Status: Future     Number of Occurrences:      Standing Expiration Date: 11/16/2016    Order Specific Question:  Has the patient fasted?    Answer:  No  . PSA    Standing Status: Future     Number of Occurrences:       Standing Expiration Date: 11/16/2016   All questions were answered. The patient knows to call the clinic with any problems, questions or concerns.      Cammie Sickle, MD 11/17/2015 8:11 PM    Cammie Sickle, MD   11/17/2015 8:11 PM

## 2015-11-17 NOTE — Telephone Encounter (Signed)
Entered in order.

## 2015-11-18 ENCOUNTER — Inpatient Hospital Stay: Payer: Medicare Other

## 2015-11-18 ENCOUNTER — Telehealth: Payer: Self-pay | Admitting: *Deleted

## 2015-11-18 DIAGNOSIS — C7951 Secondary malignant neoplasm of bone: Principal | ICD-10-CM

## 2015-11-18 DIAGNOSIS — C61 Malignant neoplasm of prostate: Secondary | ICD-10-CM | POA: Diagnosis not present

## 2015-11-18 MED ORDER — LEUPROLIDE ACETATE (3 MONTH) 22.5 MG IM KIT
22.5000 mg | PACK | Freq: Once | INTRAMUSCULAR | Status: AC
  Administered 2015-11-18: 22.5 mg via INTRAMUSCULAR
  Filled 2015-11-18: qty 22.5

## 2015-11-18 NOTE — Telephone Encounter (Signed)
Called patient and spoke to wife.  Informed her patient's PSA has increased, however, there are no changes in the plan at this time.  Awaiting provenge. Verbalized understanding.

## 2015-11-18 NOTE — Telephone Encounter (Signed)
-----   Message from Cammie Sickle, MD sent at 11/17/2015  6:01 PM EDT ----- Inform pt/wife re: PSA going up/ but no changes in plan- await provenge. Thx

## 2015-11-18 NOTE — Telephone Encounter (Signed)
Benefit Verification was faxed.  Wants to know if it was received?

## 2015-11-22 ENCOUNTER — Telehealth: Payer: Self-pay | Admitting: *Deleted

## 2015-11-22 NOTE — Telephone Encounter (Signed)
Requesting update on  Status of his Provenge

## 2015-11-22 NOTE — Telephone Encounter (Signed)
I called [facility] at [phone].  Spoke with representative-Phillip. Coordinated pt's care.  Leukophoresis collection will be at [facility] on 7/25. Pt will need to arrive-1pm. [facility] will need to be [facility] for a minimal of 4-5 hours. Wife made aware. Provenge first tx day-Friday-12/02/15.  Subsequent txs will be scheduled every other week on a Friday.  Addressed provided to wife.    [address] [place], [place] [address] Phone-1 [phone]  I spoke with coordinator Elmyra Ricks at [phone] to obtain this information

## 2015-11-24 NOTE — Telephone Encounter (Signed)
Asking for a call with the address of [facility] [place] where he will need to go on Tuesday to give blood

## 2015-11-25 ENCOUNTER — Telehealth: Payer: Self-pay | Admitting: *Deleted

## 2015-11-25 NOTE — Telephone Encounter (Signed)
Nursing care handoff/Care coordinated with nicole.[email or web address] at the [facility] on 11/24/15.  Pt's hgb stable at 11.4. Ok to proceed with leukophoresis collection from her standpoint.  [facility] for vitals, medical hx and venous access.  rcvd multiple calls today 11/25/15 from the [facility].  Fred Mccallus-nursing coordinator and Dr. Cherlynn Polo regarding this patient today. Chart has been reviewed. Dr. Cherlynn Polo -[facility] medical director called me personally ([phone]). MD concerned apt pt's clinic h/o cardiac murmer and possible aortic stenosis.  Last cardiac exam by Dr. Nehemiah Massed in October 2016.  Dr. Cherlynn Polo requesting cardiac clearance for leukophoresis collection process.  She states that the leukophoresis procedure could put potentially put patient under cardiac distress.  [facility] is [facility].  [facility] does not have any way to do cardiac monitoring during the procedure.  I spoke personally with Dr. Rogue Bussing.  Dr. Rogue Bussing personally contacted Dr. Laban Emperor the need for urgent cardiac clearance. Dr. Nehemiah Massed provided an apt at [facility] on 11/29/15.  Dr. Cherlynn Polo and Evlyn Kanner made aware that leukophoresis collection will be post poned.  Wife made aware that [facility] will be calling her with apts at [facility].  Leukophoresis collectoin will be cnl/delayed.  Contacted [place] with representative-Rhonda. Coordinated pt's care. Leukophoresis unable to be performed on 7/25.

## 2015-11-29 ENCOUNTER — Other Ambulatory Visit: Payer: Self-pay | Admitting: *Deleted

## 2015-11-29 ENCOUNTER — Telehealth: Payer: Self-pay | Admitting: *Deleted

## 2015-11-29 ENCOUNTER — Telehealth: Payer: Self-pay | Admitting: Internal Medicine

## 2015-11-29 NOTE — Telephone Encounter (Signed)
Reviewed the cardiology consultation; will fax the cardiology consultation to [facility]; and and then decide on apheresis/infusion based on [facility]. Spoke to pt's wife.  Dr.B

## 2015-11-29 NOTE — Telephone Encounter (Signed)
Opened in error

## 2015-11-29 NOTE — Telephone Encounter (Signed)
Preston Rowan do you know who called?  We thought wife already knew the plan according to [place].  Please let Heather or Rodena Piety know.

## 2015-11-29 NOTE — Telephone Encounter (Signed)
Called to states that Dr Alveria Apley note is available in chart and that she checked with Lattie Haw and he is still scheduled for Friday, she thought this appt was to be in 2 weeks not this week. Please call and advise

## 2015-11-30 ENCOUNTER — Telehealth: Payer: Self-pay | Admitting: *Deleted

## 2015-11-30 NOTE — Telephone Encounter (Signed)
Spoke with patient's wife. Explained that Dr. Alveria Apley note was [facility] to [facility] in [place] to attention.  Wife made aware that leukophoresis collection will be r/s his tx as previously discussed on 12/13/15. First provenge 12/16/15.  Lab on 8/8 moved to 8/7. Msg sent to scheduling to move this apt time. Wife preferred 11am on 8/7.

## 2015-11-30 NOTE — Telephone Encounter (Signed)
The wife is who called and she said he was not to return for 2 weeks, but he is scheduled for 1 week. I see that the appt was cancelled ,already

## 2015-12-01 ENCOUNTER — Telehealth: Payer: Self-pay | Admitting: *Deleted

## 2015-12-01 NOTE — Telephone Encounter (Signed)
MD at [facility] reviewed incoming fax records from [facility] clinic cardiology referral. She wanted to relay the following to cancer center.  1. "I did notice that an EKG was performed.  Do you have the final results of the study? 2. The cardiologist is indicating that the patient can withdraw use of aspirin prior to the procedure.  Please have the donor maintain the drug.  Use of aspirin will not defer the donor and he may actually be at risk with the apheresis procedure."   I contacted [facility] cardiology. Final reading of ekg is not yet available. Copy of EKG obtained. I will fax this to Dr[facility] Lyda Kalata at [facility] for [facility]. Lyda Kalata review.  Dr. Lyda Kalata informed by Nira Conn, RN.     spoke with Betsi at [facility] cardiology to ask for ekg reading. She will give this msg to Dr. Nehemiah Massed.

## 2015-12-02 ENCOUNTER — Inpatient Hospital Stay: Payer: Medicare Other

## 2015-12-02 NOTE — Telephone Encounter (Signed)
Final approva; for leukophoresis obtained by Dr. Lyda Kalata. ekg signed approval by Dr. Rogue Bussing was faxed to [facility] to attention Dr. Lyda Kalata.

## 2015-12-02 NOTE — Telephone Encounter (Signed)
Incoming fax received from [facility]. ekg signed by Dr. Nehemiah Massed. This information was [facility]ed [facility] to [facility].  Msg received by Dr. Lyda Kalata "I do not see a final assessment nor that the cardiologist (based on the findings) approve the patient to undergo the apheresis.  If you can get your own physician to do this...that would be fine."  Dr. Harle Battiest you review ekg and put a note on chart that pt is approved to under the leukophoresis collection.

## 2015-12-08 ENCOUNTER — Telehealth: Payer: Self-pay | Admitting: *Deleted

## 2015-12-08 NOTE — Telephone Encounter (Signed)
Called to ask if everything is set for [employer] for Tuesday. Asks that Nira Conn please call to confirm this

## 2015-12-08 NOTE — Telephone Encounter (Signed)
Called Preston Wilson and advised of this

## 2015-12-08 NOTE — Telephone Encounter (Signed)
Called and spoke to Ms. Dressel and explained I was helping Nira Conn out and she wanted me to let her know that they obtained cardiology approval and they are set up for to [facility] t[facility] at 1 pm.  She thanked me for the info and had no add. Questions.

## 2015-12-08 NOTE — Telephone Encounter (Signed)
Please contact pt and inform wife that pt is ready to go for leukophoresis collection next week.  He is medically cleared-cardiology standpoint.  [facility] to [facility] as directed in [place] next Tuesday at 1 pm.

## 2015-12-12 ENCOUNTER — Inpatient Hospital Stay: Payer: Medicare Other | Attending: Internal Medicine

## 2015-12-12 DIAGNOSIS — M8660 Other chronic osteomyelitis, unspecified site: Secondary | ICD-10-CM | POA: Diagnosis not present

## 2015-12-12 DIAGNOSIS — J45909 Unspecified asthma, uncomplicated: Secondary | ICD-10-CM | POA: Insufficient documentation

## 2015-12-12 DIAGNOSIS — Z5112 Encounter for antineoplastic immunotherapy: Secondary | ICD-10-CM | POA: Insufficient documentation

## 2015-12-12 DIAGNOSIS — F17210 Nicotine dependence, cigarettes, uncomplicated: Secondary | ICD-10-CM | POA: Insufficient documentation

## 2015-12-12 DIAGNOSIS — C7951 Secondary malignant neoplasm of bone: Secondary | ICD-10-CM | POA: Insufficient documentation

## 2015-12-12 DIAGNOSIS — R0602 Shortness of breath: Secondary | ICD-10-CM | POA: Insufficient documentation

## 2015-12-12 DIAGNOSIS — R011 Cardiac murmur, unspecified: Secondary | ICD-10-CM | POA: Insufficient documentation

## 2015-12-12 DIAGNOSIS — C61 Malignant neoplasm of prostate: Secondary | ICD-10-CM | POA: Insufficient documentation

## 2015-12-12 DIAGNOSIS — E785 Hyperlipidemia, unspecified: Secondary | ICD-10-CM | POA: Insufficient documentation

## 2015-12-12 DIAGNOSIS — Z79818 Long term (current) use of other agents affecting estrogen receptors and estrogen levels: Secondary | ICD-10-CM | POA: Diagnosis not present

## 2015-12-12 DIAGNOSIS — Z923 Personal history of irradiation: Secondary | ICD-10-CM | POA: Insufficient documentation

## 2015-12-12 DIAGNOSIS — Z79899 Other long term (current) drug therapy: Secondary | ICD-10-CM | POA: Diagnosis not present

## 2015-12-12 DIAGNOSIS — C779 Secondary and unspecified malignant neoplasm of lymph node, unspecified: Secondary | ICD-10-CM | POA: Diagnosis not present

## 2015-12-12 LAB — CBC WITH DIFFERENTIAL/PLATELET
BASOS ABS: 0 10*3/uL (ref 0–0.1)
Basophils Relative: 1 %
EOS ABS: 0.1 10*3/uL (ref 0–0.7)
EOS PCT: 1 %
HCT: 35.2 % — ABNORMAL LOW (ref 40.0–52.0)
Hemoglobin: 12.1 g/dL — ABNORMAL LOW (ref 13.0–18.0)
LYMPHS ABS: 2 10*3/uL (ref 1.0–3.6)
LYMPHS PCT: 39 %
MCH: 31.7 pg (ref 26.0–34.0)
MCHC: 34.5 g/dL (ref 32.0–36.0)
MCV: 91.9 fL (ref 80.0–100.0)
Monocytes Absolute: 0.4 10*3/uL (ref 0.2–1.0)
Monocytes Relative: 7 %
Neutro Abs: 2.6 10*3/uL (ref 1.4–6.5)
Neutrophils Relative %: 52 %
PLATELETS: 207 10*3/uL (ref 150–440)
RBC: 3.83 MIL/uL — ABNORMAL LOW (ref 4.40–5.90)
RDW: 14.6 % — AB (ref 11.5–14.5)
WBC: 5.1 10*3/uL (ref 3.8–10.6)

## 2015-12-12 LAB — COMPREHENSIVE METABOLIC PANEL
ALT: 15 U/L — ABNORMAL LOW (ref 17–63)
AST: 19 U/L (ref 15–41)
Albumin: 4.6 g/dL (ref 3.5–5.0)
Alkaline Phosphatase: 130 U/L — ABNORMAL HIGH (ref 38–126)
Anion gap: 5 (ref 5–15)
BUN: 15 mg/dL (ref 6–20)
CHLORIDE: 102 mmol/L (ref 101–111)
CO2: 26 mmol/L (ref 22–32)
Calcium: 9.2 mg/dL (ref 8.9–10.3)
Creatinine, Ser: 0.56 mg/dL — ABNORMAL LOW (ref 0.61–1.24)
Glucose, Bld: 95 mg/dL (ref 65–99)
POTASSIUM: 4.3 mmol/L (ref 3.5–5.1)
Sodium: 133 mmol/L — ABNORMAL LOW (ref 135–145)
Total Bilirubin: 0.5 mg/dL (ref 0.3–1.2)
Total Protein: 7.3 g/dL (ref 6.5–8.1)

## 2015-12-12 LAB — PSA: PSA: 99.8 ng/mL — AB (ref 0.00–4.00)

## 2015-12-13 ENCOUNTER — Other Ambulatory Visit: Payer: Self-pay

## 2015-12-14 ENCOUNTER — Telehealth: Payer: Self-pay | Admitting: *Deleted

## 2015-12-14 NOTE — Telephone Encounter (Signed)
Voice mail msg left for RN/Dr. Sima Matas McCallus.  "I just wanted to give you a brief report on his collection.  His veins are small for the access and blood flows that we require. We were, after several venipunctures able to complete the procedure and he tolerated it well. I advised him and his wife that he would probably be more comfortable with a central venous line for the future collections. He stated that he would continue with the venipunctures for the rest of his collections. So we will continue as he wishes. Just thought I would give you an update. I ran him conservatively d/t his past history and he was asymptomatic throughout the collection."

## 2015-12-15 ENCOUNTER — Encounter: Payer: Self-pay | Admitting: Internal Medicine

## 2015-12-15 ENCOUNTER — Inpatient Hospital Stay: Payer: Medicare Other

## 2015-12-15 ENCOUNTER — Inpatient Hospital Stay (HOSPITAL_BASED_OUTPATIENT_CLINIC_OR_DEPARTMENT_OTHER): Payer: Medicare Other | Admitting: Internal Medicine

## 2015-12-15 DIAGNOSIS — C7951 Secondary malignant neoplasm of bone: Secondary | ICD-10-CM | POA: Diagnosis not present

## 2015-12-15 DIAGNOSIS — J45909 Unspecified asthma, uncomplicated: Secondary | ICD-10-CM

## 2015-12-15 DIAGNOSIS — Z79818 Long term (current) use of other agents affecting estrogen receptors and estrogen levels: Secondary | ICD-10-CM

## 2015-12-15 DIAGNOSIS — C61 Malignant neoplasm of prostate: Secondary | ICD-10-CM

## 2015-12-15 DIAGNOSIS — C779 Secondary and unspecified malignant neoplasm of lymph node, unspecified: Secondary | ICD-10-CM

## 2015-12-15 DIAGNOSIS — F17210 Nicotine dependence, cigarettes, uncomplicated: Secondary | ICD-10-CM

## 2015-12-15 DIAGNOSIS — R011 Cardiac murmur, unspecified: Secondary | ICD-10-CM

## 2015-12-15 DIAGNOSIS — Z79899 Other long term (current) drug therapy: Secondary | ICD-10-CM

## 2015-12-15 DIAGNOSIS — Z923 Personal history of irradiation: Secondary | ICD-10-CM

## 2015-12-15 DIAGNOSIS — M8660 Other chronic osteomyelitis, unspecified site: Secondary | ICD-10-CM

## 2015-12-15 DIAGNOSIS — R0602 Shortness of breath: Secondary | ICD-10-CM

## 2015-12-15 DIAGNOSIS — E785 Hyperlipidemia, unspecified: Secondary | ICD-10-CM

## 2015-12-15 MED ORDER — DENOSUMAB 120 MG/1.7ML ~~LOC~~ SOLN
120.0000 mg | Freq: Once | SUBCUTANEOUS | Status: AC
Start: 2015-12-15 — End: 2015-12-15
  Administered 2015-12-15: 120 mg via SUBCUTANEOUS
  Filled 2015-12-15: qty 1.7

## 2015-12-15 NOTE — Patient Instructions (Signed)
Here is written information on the 2 different future drug drugs that Dr. Rogue Bussing spoke to you about.     Docetaxel injection What is this medicine? DOCETAXEL (doe se TAX el) is a chemotherapy drug. It targets fast dividing cells, like cancer cells, and causes these cells to die. This medicine is used to treat many types of cancers like breast cancer, certain stomach cancers, head and neck cancer, lung cancer, and prostate cancer. This medicine may be used for other purposes; ask your health care provider or pharmacist if you have questions. What should I tell my health care provider before I take this medicine? They need to know if you have any of these conditions: -infection (especially a virus infection such as chickenpox, cold sores, or herpes) -liver disease -low blood counts, like low white cell, platelet, or red cell counts -an unusual or allergic reaction to docetaxel, polysorbate 80, other chemotherapy agents, other medicines, foods, dyes, or preservatives -pregnant or trying to get pregnant -breast-feeding How should I use this medicine? This drug is given as an infusion into a vein. It is administered in a hospital or clinic by a specially trained health care professional. Talk to your pediatrician regarding the use of this medicine in children. Special care may be needed. Overdosage: If you think you have taken too much of this medicine contact a poison control center or emergency room at once. NOTE: This medicine is only for you. Do not share this medicine with others. What if I miss a dose? It is important not to miss your dose. Call your doctor or health care professional if you are unable to keep an appointment. What may interact with this medicine? -cyclosporine -erythromycin -ketoconazole -medicines to increase blood counts like filgrastim, pegfilgrastim, sargramostim -vaccines Talk to your doctor or health care professional before taking any of these  medicines: -acetaminophen -aspirin -ibuprofen -ketoprofen -naproxen This list may not describe all possible interactions. Give your health care provider a list of all the medicines, herbs, non-prescription drugs, or dietary supplements you use. Also tell them if you smoke, drink alcohol, or use illegal drugs. Some items may interact with your medicine. What should I watch for while using this medicine? Your condition will be monitored carefully while you are receiving this medicine. You will need important blood work done while you are taking this medicine. This drug may make you feel generally unwell. This is not uncommon, as chemotherapy can affect healthy cells as well as cancer cells. Report any side effects. Continue your course of treatment even though you feel ill unless your doctor tells you to stop. In some cases, you may be given additional medicines to help with side effects. Follow all directions for their use. Call your doctor or health care professional for advice if you get a fever, chills or sore throat, or other symptoms of a cold or flu. Do not treat yourself. This drug decreases your body's ability to fight infections. Try to avoid being around people who are sick. This medicine may increase your risk to bruise or bleed. Call your doctor or health care professional if you notice any unusual bleeding. This medicine may contain alcohol in the product. You may get drowsy or dizzy. Do not drive, use machinery, or do anything that needs mental alertness until you know how this medicine affects you. Do not stand or sit up quickly, especially if you are an older patient. This reduces the risk of dizzy or fainting spells. Avoid alcoholic drinks. Do not become  pregnant while taking this medicine. Women should inform their doctor if they wish to become pregnant or think they might be pregnant. There is a potential for serious side effects to an unborn child. Talk to your health care  professional or pharmacist for more information. Do not breast-feed an infant while taking this medicine. What side effects may I notice from receiving this medicine? Side effects that you should report to your doctor or health care professional as soon as possible: -allergic reactions like skin rash, itching or hives, swelling of the face, lips, or tongue -low blood counts - This drug may decrease the number of white blood cells, red blood cells and platelets. You may be at increased risk for infections and bleeding. -signs of infection - fever or chills, cough, sore throat, pain or difficulty passing urine -signs of decreased platelets or bleeding - bruising, pinpoint red spots on the skin, black, tarry stools, nosebleeds -signs of decreased red blood cells - unusually weak or tired, fainting spells, lightheadedness -breathing problems -fast or irregular heartbeat -low blood pressure -mouth sores -nausea and vomiting -pain, swelling, redness or irritation at the injection site -pain, tingling, numbness in the hands or feet -swelling of the ankle, feet, hands -weight gain Side effects that usually do not require medical attention (report to your prescriber or health care professional if they continue or are bothersome): -bone pain -complete hair loss including hair on your head, underarms, pubic hair, eyebrows, and eyelashes -diarrhea -excessive tearing -changes in the color of fingernails -loosening of the fingernails -nausea -muscle pain -red flush to skin -sweating -weak or tired This list may not describe all possible side effects. Call your doctor for medical advice about side effects. You may report side effects to FDA at 1-800-FDA-1088. Where should I keep my medicine? This drug is given in a hospital or clinic and will not be stored at home. NOTE: This sheet is a summary. It may not cover all possible information. If you have questions about this medicine, talk to your  doctor, pharmacist, or health care provider.    2016, Elsevier/Gold Standard. (2014-05-10 16:04:57)        Radium-223 dichloride solution for injection What is this medicine? RADIUM-223 DICHLORIDE (ray dee um) is a radiopharmaceutical drug. This medicine allows radiation to target bone metastases from prostate cancer and prevent fractures and other bone problems caused by cancer bone metastases. This medicine may be used for other purposes; ask your health care provider or pharmacist if you have questions. What should I tell my health care provider before I take this medicine? They need to know if you have any of these conditions: -blood disorders -low blood counts -pregnant or trying to get pregnant -breast-feeding How should I use this medicine? This medicine is for injection into a vein. It is given by a health care professional in a hospital or clinic setting. Talk to your pediatrician regarding the use of this medicine in children. Special care may be needed. Overdosage: If you think you have taken too much of this medicine contact a poison control center or emergency room at once. NOTE: This medicine is only for you. Do not share this medicine with others. What if I miss a dose? It is important not to miss your dose. Call your doctor or health care professional if you are unable to keep an appointment. What may interact with this medicine? Interactions have not been studied. This list may not describe all possible interactions. Give your health care provider a  list of all the medicines, herbs, non-prescription drugs, or dietary supplements you use. Also tell them if you smoke, drink alcohol, or use illegal drugs. Some items may interact with your medicine. What should I watch for while using this medicine? Visit your doctor for checks on your progress. This drug may make you feel generally unwell. This is not uncommon, as chemotherapy can affect healthy cells as well as cancer  cells. Report any side effects. Continue your course of treatment even though you feel ill unless your doctor tells you to stop. Call your doctor or health care professional for advice if you get a fever, chills or sore throat, or other symptoms of a cold or flu. Do not treat yourself. This drug decreases your body's ability to fight infections. Try to avoid being around people who are sick. This medicine may increase your risk to bruise or bleed. Call your doctor or health care professional if you notice any unusual bleeding. Be careful brushing and flossing your teeth or using a toothpick because you may get an infection or bleed more easily. If you have any dental work done, tell your dentist you are receiving this medicine. Avoid taking products that contain aspirin, acetaminophen, ibuprofen, naproxen, or ketoprofen unless instructed by your doctor. These medicines may hide a fever. Men must use a latex condom during sexual contact with a woman while taking this medicine and for 6 months after you stop taking this medicine. A latex condom is needed even if you have had a vasectomy. Contact your doctor right away if your partner becomes pregnant. Do not donate sperm while taking this medicine and for 6 months after you stop taking this medicine. Do not become pregnant while taking this medicine or for 6 months after stopping it. Women should inform their doctor if they wish to become pregnant or think they might be pregnant. There is a potential for serious side effects to an unborn child. Talk to your health care professional or pharmacist for more information. Talk to your doctor about your risk of cancer. You may be more at risk for certain types of cancers if you take this medicine. Your condition will be monitored carefully while you are receiving this medicine. You may need blood work done while you are taking this medicine. What side effects may I notice from receiving this medicine? Side  effects that you should report to your doctor or health care professional as soon as possible: -allergic reactions like skin rash, itching or hives, swelling of the face, lips, or tongue -low blood counts - this medicine may decrease the number of white blood cells, red blood cells and platelets. You may be at increased risk for infections and bleeding -pain, redness, or irritation at site where injected -signs of infection - fever or chills, cough, sore throat, pain or difficulty passing urine -signs of decreased platelets or bleeding - bruising, pinpoint red spots on the skin, black, tarry stools, blood in the urine -signs of decreased red blood cells - unusually weak or tired, feeling faint or lightheaded, falls -signs of infection - fever or chills, cough, sore throat, pain or trouble passing urine -swelling of the ankle, feet, hands Side effects that usually do not require medical attention (Report these to your doctor or health care professional if they continue or are bothersome.) -diarrhea -dizziness -dry mouth -nausea, vomiting This list may not describe all possible side effects. Call your doctor for medical advice about side effects. You may report side effects to  FDA at 1-800-FDA-1088. Where should I keep my medicine? This drug is given in a hospital or clinic and will not be stored at home. NOTE: This sheet is a summary. It may not cover all possible information. If you have questions about this medicine, talk to your doctor, pharmacist, or health care provider.    2016, Elsevier/Gold Standard. (2011-10-02 16:37:35)

## 2015-12-15 NOTE — Progress Notes (Signed)
[facility] OFFICE PROGRESS NOTE  Patient Care Team: Lenard Simmer, MD as PCP - General (Endocrinology)  No matching staging information was found for the patient.   Oncology History   1. Carcinoma of prostate diagnosis in 2006.  Had received radiation therapy, external beam to prostate and pelvic lymph nodes.  Had Gleason 9 (4+5.)  PainBaseline PSA was 13. 2. Received Lupron injection after her radiation therapy also.  PSA was going up.  Last year or so received Lupron injection in September but did not have any followup.   3. Recently (this August, 2012) PSA was found to be 17. Patient was started on Lupron from September of 2012. 4. Patient has progressive disease by PSA criteria (October, 2013) 5. Patient was started on Zytiga and prednisone in November of 2013. 6.traumatic hip fracture ((right femur) status post internally fixed ,no evidence of metastatic disease(May of 2014) 7.bone scan in July of 2014 at outside institutions shows metastases to left scapula and left 11th rib/.. 8.progressive On ZYTIGA by PSA criteria.  9[name], September of 2015- Progression- Jan 2016  # AUG 11th- [place]      Prostate cancer metastatic to bone [facility])   10/19/2015 Initial Diagnosis    Prostate cancer metastatic to bone [facility])       INTERVAL HISTORY:  Preston Wilson 80 y.o.  male pleasant patient above history of Metastatic prostate cancer status post multiple lines of therapy catheter system is here for follow-up.  Patient underwent [facility]heresis at the [facility] yesterday. He is awaiting transfusion of the treated leukocytes tomorrow. Leukapheresis was done without complication.   Continues to feel well overall. Denies any pain. No nausea no vomiting.   REVIEW OF SYSTEMS:  A complete 10 point review of system is done which is negative except mentioned above/history of present illness.   PAST MEDICAL HISTORY :  Past Medical History:  Diagnosis Date  .  Asthma   . Heart murmur   . Hyperlipidemia   . Hypocalcemia   . Osteomyelitis ([place])   . Prostate cancer ([place])   . SOB (shortness of breath)     PAST SURGICAL HISTORY :   Past Surgical History:  Procedure Laterality Date  . COLONOSCOPY WITH PROPOFOL N/A 04/12/2015   Procedure: COLONOSCOPY WITH PROPOFOL;  Surgeon: Lollie Sails, MD;  Location: [facility] ENDOSCOPY;  Service: Endoscopy;  Laterality: N/A;  . EXCISIONAL HEMORRHOIDECTOMY    . TONSILLECTOMY      FAMILY HISTORY :  No family history on file.  SOCIAL HISTORY:   Social History  Substance Use Topics  . Smoking status: Current Every Day Smoker    Packs/day: 0.50    Types: Cigarettes  . Smokeless tobacco: Never Used  . Alcohol use No    ALLERGIES:  has No Known Allergies.  MEDICATIONS:  Current Outpatient Prescriptions  Medication Sig Dispense Refill  . ADVAIR DISKUS 100-50 MCG/DOSE AEPB inhale 1 dose by mouth twice a day  0  . calcium-vitamin D (OSCAL WITH D) 500-200 MG-UNIT per tablet Take 1 tablet by mouth daily with breakfast.     . Leuprolide Acetate (LUPRON IJ) Inject 1 mL as directed every 3 (three) months.    . Multiple Vitamin (MULTIVITAMIN) tablet Take 1 tablet by mouth daily.    . pravastatin (PRAVACHOL) 20 MG tablet Take 20 mg by mouth daily.    . Vitamin D, Ergocalciferol, (DRISDOL) 50000 UNITS CAPS capsule Take 50,000 Units by mouth once a week. Fridays  0  No current facility-administered medications for this visit.     PHYSICAL EXAMINATION: ECOG PERFORMANCE STATUS: 0 - Asymptomatic  BP 123/72 (BP Location: Right Arm, Patient Position: Sitting)   Pulse 73   Temp 97.9 F (36.6 C) (Tympanic)   Resp 16   Ht 6' (1.829 m)   Wt 174 lb 6.4 oz (79.1 kg)   BMI 23.65 kg/m   Filed Weights   12/15/15 1452  Weight: 174 lb 6.4 oz (79.1 kg)    GENERAL: Well-nourished well-developed; Alert, no distress and comfortable.   Accompanied by family. EYES: no pallor or icterus OROPHARYNX: no thrush or  ulceration; good dentition  NECK: supple, no masses felt LYMPH:  no palpable lymphadenopathy in the cervical, axillary or inguinal regions LUNGS: clear to auscultation and  No wheeze or crackles HEART/CVS: regular rate & rhythm and no murmurs; No lower extremity edema ABDOMEN:abdomen soft, non-tender and normal bowel sounds Musculoskeletal:no cyanosis of digits and no clubbing  PSYCH: alert & oriented x 3 with fluent speech NEURO: no focal motor/sensory deficits SKIN:  no rashes or significant lesions  LABORATORY DATA:  I have reviewed the data as listed    Component Value Date/Time   NA 133 (L) 12/12/2015 1121   NA 138 08/25/2014 1442   K 4.3 12/12/2015 1121   K 4.1 08/25/2014 1442   CL 102 12/12/2015 1121   CL 106 08/25/2014 1442   CO2 26 12/12/2015 1121   CO2 25 08/25/2014 1442   GLUCOSE 95 12/12/2015 1121   GLUCOSE 138 (H) 08/25/2014 1442   BUN 15 12/12/2015 1121   BUN 19 08/25/2014 1442   CREATININE 0.56 (L) 12/12/2015 1121   CREATININE 0.53 (L) 08/25/2014 1442   CALCIUM 9.2 12/12/2015 1121   CALCIUM 9.0 08/25/2014 1442   PROT 7.3 12/12/2015 1121   PROT 6.8 08/25/2014 1442   ALBUMIN 4.6 12/12/2015 1121   ALBUMIN 3.9 08/25/2014 1442   AST 19 12/12/2015 1121   AST 18 08/25/2014 1442   ALT 15 (L) 12/12/2015 1121   ALT 13 (L) 08/25/2014 1442   ALKPHOS 130 (H) 12/12/2015 1121   ALKPHOS 73 08/25/2014 1442   BILITOT 0.5 12/12/2015 1121   BILITOT 0.7 08/25/2014 1442   GFRNONAA >60 12/12/2015 1121   GFRNONAA >60 08/25/2014 1442   GFRAA >60 12/12/2015 1121   GFRAA >60 08/25/2014 1442    No results found for: SPEP, UPEP  Lab Results  Component Value Date   WBC 5.1 12/12/2015   NEUTROABS 2.6 12/12/2015   HGB 12.1 (L) 12/12/2015   HCT 35.2 (L) 12/12/2015   MCV 91.9 12/12/2015   PLT 207 12/12/2015      Chemistry      Component Value Date/Time   NA 133 (L) 12/12/2015 1121   NA 138 08/25/2014 1442   K 4.3 12/12/2015 1121   K 4.1 08/25/2014 1442   CL 102  12/12/2015 1121   CL 106 08/25/2014 1442   CO2 26 12/12/2015 1121   CO2 25 08/25/2014 1442   BUN 15 12/12/2015 1121   BUN 19 08/25/2014 1442   CREATININE 0.56 (L) 12/12/2015 1121   CREATININE 0.53 (L) 08/25/2014 1442      Component Value Date/Time   CALCIUM 9.2 12/12/2015 1121   CALCIUM 9.0 08/25/2014 1442   ALKPHOS 130 (H) 12/12/2015 1121   ALKPHOS 73 08/25/2014 1442   AST 19 12/12/2015 1121   AST 18 08/25/2014 1442   ALT 15 (L) 12/12/2015 1121   ALT 13 (L) 08/25/2014 1442  BILITOT 0.5 12/12/2015 1121   BILITOT 0.7 08/25/2014 1442       RADIOGRAPHIC STUDIES: I have personally reviewed the radiological images as listed and agreed with the findings in the report. No results found.   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  May 2017 CT scan shows progressive lesions in the bone and no evidence of any visceral metastases.  PSA- 99- in July 2017.  # plan start provenge tomorrow.   # next option would be xofigo vs docetaxel q week.   # follow-up in one month/with labs  # 25 minutes face-to-face with the patient discussing the above plan of care; more than 50% of time spent on prognosis/ natural history; counseling and coordination.   Orders Placed This Encounter  Procedures  . Comprehensive metabolic panel    Standing Status:   Future    Standing Expiration Date:   12/14/2016  . CBC with Differential    Standing Status:   Future    Standing Expiration Date:   12/14/2016  . PSA    Standing Status:   Future    Standing Expiration Date:   12/14/2016   All questions were answered. The patient knows to call the clinic with any problems, questions or concerns.      Cammie Sickle, MD 12/16/2015 7:38 PM

## 2015-12-15 NOTE — Progress Notes (Signed)
No changes since last visit. 

## 2015-12-15 NOTE — Assessment & Plan Note (Signed)
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  May 2017 CT scan shows progressive lesions in the bone and no evidence of any visceral metastases.  PSA- 99- in July 2017.  # plan start provenge tomorrow.   # next option would be xofigo vs docetaxel q week.   # follow-up in one month/with labs  # 25 minutes face-to-face with the patient discussing the above plan of care; more than 50% of time spent on prognosis/ natural history; counseling and coordination.

## 2015-12-15 NOTE — Progress Notes (Signed)
Pt had leukophoresis collection as scheduled this week. He is scheduled for provenge therapy tomorrow.

## 2015-12-16 ENCOUNTER — Inpatient Hospital Stay: Payer: Medicare Other

## 2015-12-16 VITALS — BP 145/75 | HR 74 | Temp 98.3°F | Resp 18

## 2015-12-16 DIAGNOSIS — C61 Malignant neoplasm of prostate: Secondary | ICD-10-CM | POA: Diagnosis not present

## 2015-12-16 DIAGNOSIS — C7951 Secondary malignant neoplasm of bone: Principal | ICD-10-CM

## 2015-12-16 MED ORDER — DIPHENHYDRAMINE HCL 50 MG/ML IJ SOLN
50.0000 mg | Freq: Once | INTRAMUSCULAR | Status: AC
  Administered 2015-12-16: 50 mg via INTRAVENOUS
  Filled 2015-12-16: qty 1

## 2015-12-16 MED ORDER — ACETAMINOPHEN 325 MG PO TABS
650.0000 mg | ORAL_TABLET | Freq: Once | ORAL | Status: AC
  Administered 2015-12-16: 650 mg via ORAL
  Filled 2015-12-16: qty 2

## 2015-12-16 MED ORDER — SIPULEUCEL-T IV INFUSION
250.0000 mL | Freq: Once | INTRAVENOUS | Status: AC
  Administered 2015-12-16: 250 mL via INTRAVENOUS
  Filled 2015-12-16: qty 250

## 2015-12-16 MED ORDER — SODIUM CHLORIDE 0.9 % IV SOLN
Freq: Once | INTRAVENOUS | Status: AC
  Administered 2015-12-16: 14:00:00 via INTRAVENOUS
  Filled 2015-12-16: qty 1000

## 2015-12-30 ENCOUNTER — Other Ambulatory Visit: Payer: Self-pay | Admitting: Internal Medicine

## 2015-12-30 ENCOUNTER — Inpatient Hospital Stay: Payer: Medicare Other

## 2015-12-30 VITALS — BP 113/64 | HR 73 | Temp 97.9°F | Resp 18

## 2015-12-30 DIAGNOSIS — C61 Malignant neoplasm of prostate: Secondary | ICD-10-CM

## 2015-12-30 DIAGNOSIS — C7951 Secondary malignant neoplasm of bone: Principal | ICD-10-CM

## 2015-12-30 MED ORDER — ACETAMINOPHEN 325 MG PO TABS
650.0000 mg | ORAL_TABLET | Freq: Once | ORAL | Status: AC
  Administered 2015-12-30: 650 mg via ORAL
  Filled 2015-12-30: qty 2

## 2015-12-30 MED ORDER — SIPULEUCEL-T IV INFUSION
250.0000 mL | Freq: Once | INTRAVENOUS | Status: AC
Start: 2015-12-30 — End: 2015-12-30
  Administered 2015-12-30: 250 mL via INTRAVENOUS
  Filled 2015-12-30: qty 250

## 2015-12-30 MED ORDER — FAMOTIDINE IN NACL 20-0.9 MG/50ML-% IV SOLN
20.0000 mg | Freq: Once | INTRAVENOUS | Status: DC | PRN
Start: 2015-12-30 — End: 2015-12-30

## 2015-12-30 MED ORDER — DIPHENHYDRAMINE HCL 50 MG/ML IJ SOLN: 50.0000 mg | INTRAMUSCULAR | Status: DC | PRN

## 2015-12-30 MED ORDER — MEPERIDINE HCL 25 MG/ML IJ SOLN: 12.5000 mg | INTRAMUSCULAR | Status: DC | PRN

## 2015-12-30 MED ORDER — DIPHENHYDRAMINE HCL 50 MG/ML IJ SOLN
50.0000 mg | Freq: Once | INTRAMUSCULAR | Status: AC
  Administered 2015-12-30: 50 mg via INTRAVENOUS
  Filled 2015-12-30: qty 1

## 2015-12-30 MED ORDER — SODIUM CHLORIDE 0.9 % IV SOLN
Freq: Once | INTRAVENOUS | Status: AC
  Administered 2015-12-30: 10:00:00 via INTRAVENOUS
  Filled 2015-12-30: qty 1000

## 2016-01-12 ENCOUNTER — Inpatient Hospital Stay (HOSPITAL_BASED_OUTPATIENT_CLINIC_OR_DEPARTMENT_OTHER): Payer: Medicare Other | Admitting: Internal Medicine

## 2016-01-12 ENCOUNTER — Encounter: Payer: Self-pay | Admitting: Internal Medicine

## 2016-01-12 ENCOUNTER — Telehealth: Payer: Self-pay | Admitting: *Deleted

## 2016-01-12 ENCOUNTER — Inpatient Hospital Stay: Payer: Medicare Other

## 2016-01-12 ENCOUNTER — Other Ambulatory Visit: Payer: Self-pay

## 2016-01-12 ENCOUNTER — Inpatient Hospital Stay: Payer: Medicare Other | Attending: Internal Medicine

## 2016-01-12 VITALS — BP 125/68 | HR 96 | Temp 96.9°F | Resp 17 | Ht 72.0 in | Wt 173.8 lb

## 2016-01-12 DIAGNOSIS — F17210 Nicotine dependence, cigarettes, uncomplicated: Secondary | ICD-10-CM | POA: Insufficient documentation

## 2016-01-12 DIAGNOSIS — M869 Osteomyelitis, unspecified: Secondary | ICD-10-CM | POA: Insufficient documentation

## 2016-01-12 DIAGNOSIS — Z79899 Other long term (current) drug therapy: Secondary | ICD-10-CM | POA: Diagnosis not present

## 2016-01-12 DIAGNOSIS — J45909 Unspecified asthma, uncomplicated: Secondary | ICD-10-CM

## 2016-01-12 DIAGNOSIS — R011 Cardiac murmur, unspecified: Secondary | ICD-10-CM | POA: Diagnosis not present

## 2016-01-12 DIAGNOSIS — C7951 Secondary malignant neoplasm of bone: Secondary | ICD-10-CM

## 2016-01-12 DIAGNOSIS — Z5111 Encounter for antineoplastic chemotherapy: Secondary | ICD-10-CM | POA: Insufficient documentation

## 2016-01-12 DIAGNOSIS — Z923 Personal history of irradiation: Secondary | ICD-10-CM | POA: Insufficient documentation

## 2016-01-12 DIAGNOSIS — C61 Malignant neoplasm of prostate: Secondary | ICD-10-CM

## 2016-01-12 DIAGNOSIS — E785 Hyperlipidemia, unspecified: Secondary | ICD-10-CM | POA: Insufficient documentation

## 2016-01-12 DIAGNOSIS — R0602 Shortness of breath: Secondary | ICD-10-CM | POA: Diagnosis not present

## 2016-01-12 LAB — CBC WITH DIFFERENTIAL/PLATELET
BASOS ABS: 0 10*3/uL (ref 0–0.1)
BASOS PCT: 1 %
EOS ABS: 0.1 10*3/uL (ref 0–0.7)
Eosinophils Relative: 2 %
HCT: 32.7 % — ABNORMAL LOW (ref 40.0–52.0)
HEMOGLOBIN: 11.4 g/dL — AB (ref 13.0–18.0)
LYMPHS ABS: 1.6 10*3/uL (ref 1.0–3.6)
Lymphocytes Relative: 31 %
MCH: 32 pg (ref 26.0–34.0)
MCHC: 34.9 g/dL (ref 32.0–36.0)
MCV: 91.8 fL (ref 80.0–100.0)
Monocytes Absolute: 0.3 10*3/uL (ref 0.2–1.0)
Monocytes Relative: 6 %
NEUTROS PCT: 60 %
Neutro Abs: 3.1 10*3/uL (ref 1.4–6.5)
PLATELETS: 194 10*3/uL (ref 150–440)
RBC: 3.57 MIL/uL — AB (ref 4.40–5.90)
RDW: 13.7 % (ref 11.5–14.5)
WBC: 5.1 10*3/uL (ref 3.8–10.6)

## 2016-01-12 LAB — COMPREHENSIVE METABOLIC PANEL
ALT: 15 U/L — AB (ref 17–63)
AST: 28 U/L (ref 15–41)
Albumin: 4.1 g/dL (ref 3.5–5.0)
Alkaline Phosphatase: 162 U/L — ABNORMAL HIGH (ref 38–126)
Anion gap: 9 (ref 5–15)
BILIRUBIN TOTAL: 0.5 mg/dL (ref 0.3–1.2)
BUN: 15 mg/dL (ref 6–20)
CALCIUM: 8.9 mg/dL (ref 8.9–10.3)
CHLORIDE: 102 mmol/L (ref 101–111)
CO2: 22 mmol/L (ref 22–32)
CREATININE: 0.69 mg/dL (ref 0.61–1.24)
Glucose, Bld: 143 mg/dL — ABNORMAL HIGH (ref 65–99)
Potassium: 4.1 mmol/L (ref 3.5–5.1)
Sodium: 133 mmol/L — ABNORMAL LOW (ref 135–145)
TOTAL PROTEIN: 7 g/dL (ref 6.5–8.1)

## 2016-01-12 LAB — PSA: PSA: 268 ng/mL — AB (ref 0.00–4.00)

## 2016-01-12 MED ORDER — DENOSUMAB 120 MG/1.7ML ~~LOC~~ SOLN
120.0000 mg | Freq: Once | SUBCUTANEOUS | Status: AC
  Administered 2016-01-12: 120 mg via SUBCUTANEOUS
  Filled 2016-01-12: qty 1.7

## 2016-01-12 NOTE — Progress Notes (Signed)
[place] OFFICE PROGRESS NOTE  Patient Care Team: Lenard Simmer, MD as PCP - General (Endocrinology)  No matching staging information was found for the patient.   Oncology History   1. Carcinoma of prostate diagnosis in 2006.  Had received radiation therapy, external beam to prostate and pelvic lymph nodes.  Had Gleason 9 (4+5.)  PainBaseline PSA was 13. 2. Received Lupron injection after her radiation therapy also.  PSA was going up.  Last year or so received Lupron injection in September but did not have any followup.   3. Recently (this August, 2012) PSA was found to be 17. Patient was started on Lupron from September of 2012. 4. Patient has progressive disease by PSA criteria (October, 2013) 5. Patient was started on Zytiga and prednisone in November of 2013. 6.traumatic hip fracture ((right femur) status post internally fixed ,no evidence of metastatic disease(May of 2014) 7.bone scan in July of 2014 at outside institutions shows metastases to left scapula and left 11th rib/.. 8.progressive On ZYTIGA by PSA criteria.  9[name], September of 2015- Progression- Jan 2016  # AUG 11th- Provenge 2 of planned 3 [sec to logistical issues]     Prostate cancer metastatic to bone [facility])   10/19/2015 Initial Diagnosis    Prostate cancer metastatic to bone [facility])        INTERVAL HISTORY:  [name] 80 y.o.  male pleasant patient above history of Metastatic prostate cancer status post multiple lines of therapy catheter system is here for follow-up- most recently on Provenge status post 2 out of planned 3 treatments. Third treatment was held because of difficulty with collection.  Otherwise patient tolerated treatment very well without any major side effects.  Continues to feel well overall. Denies any pain. No nausea no vomiting.  not taking any pain medications.  REVIEW OF SYSTEMS:  A complete 10 point review of system is done which is negative except  mentioned above/history of present illness.   PAST MEDICAL HISTORY :  Past Medical History:  Diagnosis Date  . Asthma   . Heart murmur   . Hyperlipidemia   . Hypocalcemia   . Osteomyelitis ([place])   . Prostate cancer ([place])   . SOB (shortness of breath)     PAST SURGICAL HISTORY :   Past Surgical History:  Procedure Laterality Date  . COLONOSCOPY WITH PROPOFOL N/A 04/12/2015   Procedure: COLONOSCOPY WITH PROPOFOL;  Surgeon: Lollie Sails, MD;  Location: [facility] ENDOSCOPY;  Service: Endoscopy;  Laterality: N/A;  . EXCISIONAL HEMORRHOIDECTOMY    . TONSILLECTOMY      FAMILY HISTORY :  History reviewed. No pertinent family history.  SOCIAL HISTORY:   Social History  Substance Use Topics  . Smoking status: Current Every Day Smoker    Packs/day: 0.50    Types: Cigarettes  . Smokeless tobacco: Never Used  . Alcohol use No    ALLERGIES:  has No Known Allergies.  MEDICATIONS:  Current Outpatient Prescriptions  Medication Sig Dispense Refill  . ADVAIR DISKUS 100-50 MCG/DOSE AEPB inhale 1 dose by mouth twice a day  0  . calcium-vitamin D (OSCAL WITH D) 500-200 MG-UNIT per tablet Take 1 tablet by mouth daily with breakfast.     . Leuprolide Acetate (LUPRON IJ) Inject 1 mL as directed every 3 (three) months.    . Multiple Vitamin (MULTIVITAMIN) tablet Take 1 tablet by mouth daily.    . pravastatin (PRAVACHOL) 20 MG tablet Take 20 mg by mouth daily.    Marland Kitchen  Vitamin D, Ergocalciferol, (DRISDOL) 50000 UNITS CAPS capsule Take 50,000 Units by mouth once a week. Fridays  0   No current facility-administered medications for this visit.     PHYSICAL EXAMINATION: ECOG PERFORMANCE STATUS: 0 - Asymptomatic  BP 125/68 (BP Location: Right Arm, Patient Position: Sitting)   Pulse 96   Temp (!) 96.9 F (36.1 C) (Tympanic)   Resp 17   Ht 6' (1.829 m)   Wt 173 lb 12.8 oz (78.8 kg)   BMI 23.57 kg/m   Filed Weights   01/12/16 1315  Weight: 173 lb 12.8 oz (78.8 kg)    GENERAL:  Well-nourished well-developed; Alert, no distress and comfortable.   Accompanied by family. EYES: no pallor or icterus OROPHARYNX: no thrush or ulceration; good dentition  NECK: supple, no masses felt LYMPH:  no palpable lymphadenopathy in the cervical, axillary or inguinal regions LUNGS: clear to auscultation and  No wheeze or crackles HEART/CVS: regular rate & rhythm and no murmurs; No lower extremity edema ABDOMEN:abdomen soft, non-tender and normal bowel sounds Musculoskeletal:no cyanosis of digits and no clubbing  PSYCH: alert & oriented x 3 with fluent speech NEURO: no focal motor/sensory deficits SKIN:  no rashes or significant lesions  LABORATORY DATA:  I have reviewed the data as listed    Component Value Date/Time   NA 133 (L) 01/12/2016 1300   NA 138 08/25/2014 1442   K 4.1 01/12/2016 1300   K 4.1 08/25/2014 1442   CL 102 01/12/2016 1300   CL 106 08/25/2014 1442   CO2 22 01/12/2016 1300   CO2 25 08/25/2014 1442   GLUCOSE 143 (H) 01/12/2016 1300   GLUCOSE 138 (H) 08/25/2014 1442   BUN 15 01/12/2016 1300   BUN 19 08/25/2014 1442   CREATININE 0.69 01/12/2016 1300   CREATININE 0.53 (L) 08/25/2014 1442   CALCIUM 8.9 01/12/2016 1300   CALCIUM 9.0 08/25/2014 1442   PROT 7.0 01/12/2016 1300   PROT 6.8 08/25/2014 1442   ALBUMIN 4.1 01/12/2016 1300   ALBUMIN 3.9 08/25/2014 1442   AST 28 01/12/2016 1300   AST 18 08/25/2014 1442   ALT 15 (L) 01/12/2016 1300   ALT 13 (L) 08/25/2014 1442   ALKPHOS 162 (H) 01/12/2016 1300   ALKPHOS 73 08/25/2014 1442   BILITOT 0.5 01/12/2016 1300   BILITOT 0.7 08/25/2014 1442   GFRNONAA >60 01/12/2016 1300   GFRNONAA >60 08/25/2014 1442   GFRAA >60 01/12/2016 1300   GFRAA >60 08/25/2014 1442    No results found for: SPEP, UPEP  Lab Results  Component Value Date   WBC 5.1 01/12/2016   NEUTROABS 3.1 01/12/2016   HGB 11.4 (L) 01/12/2016   HCT 32.7 (L) 01/12/2016   MCV 91.8 01/12/2016   PLT 194 01/12/2016      Chemistry       Component Value Date/Time   NA 133 (L) 01/12/2016 1300   NA 138 08/25/2014 1442   K 4.1 01/12/2016 1300   K 4.1 08/25/2014 1442   CL 102 01/12/2016 1300   CL 106 08/25/2014 1442   CO2 22 01/12/2016 1300   CO2 25 08/25/2014 1442   BUN 15 01/12/2016 1300   BUN 19 08/25/2014 1442   CREATININE 0.69 01/12/2016 1300   CREATININE 0.53 (L) 08/25/2014 1442      Component Value Date/Time   CALCIUM 8.9 01/12/2016 1300   CALCIUM 9.0 08/25/2014 1442   ALKPHOS 162 (H) 01/12/2016 1300   ALKPHOS 73 08/25/2014 1442   AST 28 01/12/2016 1300  AST 18 08/25/2014 1442   ALT 15 (L) 01/12/2016 1300   ALT 13 (L) 08/25/2014 1442   BILITOT 0.5 01/12/2016 1300   BILITOT 0.7 08/25/2014 1442       RADIOGRAPHIC STUDIES: I have personally reviewed the radiological images as listed and agreed with the findings in the report. No results found.   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  May 2017 CT scan shows progressive lesions in the bone and no evidence of any visceral metastases.  PSA- 99- in July 2017.  # s/p provenege 2 of planned 3. Hold off further provenge.   # proceed with Trudi Ida; wants to hold off chemo at this time. Refer to radiation oncology.    # follow-up in one month/with labs [cbc/cmp/psa/testoterone]- before the visit.    Orders Placed This Encounter  Procedures  . CBC with Differential    Standing Status:   Future    Standing Expiration Date:   01/11/2017  . Comprehensive metabolic panel    Standing Status:   Future    Standing Expiration Date:   01/11/2017  . PSA    Standing Status:   Future    Standing Expiration Date:   01/11/2017  . Testosterone    Standing Status:   Future    Standing Expiration Date:   01/11/2017        Cammie Sickle, MD 01/12/2016 5:42 PM

## 2016-01-12 NOTE — Telephone Encounter (Signed)
Patient requesting phone call re: psa results tomorrow.

## 2016-01-12 NOTE — Assessment & Plan Note (Signed)
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  May 2017 CT scan shows progressive lesions in the bone and no evidence of any visceral metastases.  PSA- 99- in July 2017.  # s/p provenege 2 of planned 3. Hold off further provenge.   # proceed with Trudi Ida; wants to hold off chemo at this time. Refer to radiation oncology.    # follow-up in one month/with labs [cbc/cmp/psa/testoterone]- before the visit.

## 2016-01-13 ENCOUNTER — Inpatient Hospital Stay: Payer: Medicare Other

## 2016-01-13 NOTE — Telephone Encounter (Signed)
Contacted pt - spoke with wife. informed pt's wife psa level yesterday 268.00.  Wife verbalizes that she is "concerned that the psa level has more than doubled."   Wife requesting sooner apt with Dr. Baruch Gouty to discuss Trudi Ida.  I personally spoke with Maudie Mercury in radiation. Apt was changed to 11 am -  01/17/16.  md discussed the following options with pt's wife.  Chemo class next week.- next Thursday Lab/md/NEW-Docetaxel (weekly) next Friday 01/20/16  Pt's wife states that she wants to wait until mtg with Dr. Massie Maroon to make final tx choice.

## 2016-01-17 ENCOUNTER — Encounter: Payer: Self-pay | Admitting: Radiation Oncology

## 2016-01-17 ENCOUNTER — Ambulatory Visit
Admission: RE | Admit: 2016-01-17 | Discharge: 2016-01-17 | Disposition: A | Discharge status: Home / Self Care | Payer: Medicare Other | Source: Ambulatory Visit | Attending: Radiation Oncology | Admitting: Radiation Oncology

## 2016-01-17 ENCOUNTER — Other Ambulatory Visit: Payer: Self-pay | Admitting: *Deleted

## 2016-01-17 VITALS — BP 121/80 | HR 109 | Temp 98.2°F | Wt 172.5 lb

## 2016-01-17 DIAGNOSIS — C61 Malignant neoplasm of prostate: Secondary | ICD-10-CM | POA: Insufficient documentation

## 2016-01-17 DIAGNOSIS — Z8739 Personal history of other diseases of the musculoskeletal system and connective tissue: Secondary | ICD-10-CM | POA: Insufficient documentation

## 2016-01-17 DIAGNOSIS — Z7951 Long term (current) use of inhaled steroids: Secondary | ICD-10-CM | POA: Diagnosis not present

## 2016-01-17 DIAGNOSIS — R011 Cardiac murmur, unspecified: Secondary | ICD-10-CM | POA: Diagnosis not present

## 2016-01-17 DIAGNOSIS — J45909 Unspecified asthma, uncomplicated: Secondary | ICD-10-CM | POA: Insufficient documentation

## 2016-01-17 DIAGNOSIS — C7951 Secondary malignant neoplasm of bone: Principal | ICD-10-CM

## 2016-01-17 DIAGNOSIS — Z923 Personal history of irradiation: Secondary | ICD-10-CM | POA: Insufficient documentation

## 2016-01-17 DIAGNOSIS — E785 Hyperlipidemia, unspecified: Secondary | ICD-10-CM | POA: Diagnosis not present

## 2016-01-17 DIAGNOSIS — R0602 Shortness of breath: Secondary | ICD-10-CM | POA: Diagnosis not present

## 2016-01-17 NOTE — Consult Note (Signed)
Except an outstanding is perfect of Radiation Oncology NEW PATIENT EVALUATION  Name: Preston Wilson  MRN: [id number]  Date:   01/17/2016     DOB: Sep 17, 1935   This 80 y.o. male patient presents to the clinic for initial evaluation of hormone refractory stage IV prostate cancer.  REFERRING PHYSICIAN: Morayati, Lourdes Sledge, MD  CHIEF COMPLAINT:  Chief Complaint  Patient presents with  . Cancer    discussion on Xifogo    DIAGNOSIS: The encounter diagnosis was Prostate cancer metastatic to bone [facility]).   PREVIOUS INVESTIGATIONS:  Clinical notes reviewed Bone scan ordered   HPI: Patient is a 80 year old male well known to our department having received external beam I MRT radiation therapy back in 2006 for Gleason 9 (4+5) receiving radiation therapy to both his prostate and pelvic nodes. He did also receive Lupron therapy at that time. In August 2012 had a biochemical recurrence and was started on intermittent Lupron therapy. He has been treated with Zytiga and prednisone in November 2013 with bone scan July 2014 showing progression with bone metastasis to left scapula left 11th rib. He's also receivedXtandi and recently has completed 2 courses of Provenge. He recently had CT scan showing bony progression with no evidence of visceral metastasis. His PSA in July 2017 was 99. They're debating whether to go ahead with I believe Taxotere-based chemotherapy versusXofigo. He is seen today for opinion from radiation oncology. He is having very little bony pain at this time.  PLANNED TREATMENT REGIMEN: Possible  Xofigo PAST MEDICAL HISTORY:  has a past medical history of Asthma; Heart murmur; Hyperlipidemia; Hypocalcemia; Osteomyelitis ([place]); Prostate cancer ([place]); and SOB (shortness of breath).    PAST SURGICAL HISTORY:  Past Surgical History:  Procedure Laterality Date  . COLONOSCOPY WITH PROPOFOL N/A 04/12/2015   Procedure: COLONOSCOPY WITH PROPOFOL;  Surgeon: Lollie Sails, MD;   Location: [facility] ENDOSCOPY;  Service: Endoscopy;  Laterality: N/A;  . EXCISIONAL HEMORRHOIDECTOMY    . TONSILLECTOMY      FAMILY HISTORY: family history is not on file.  SOCIAL HISTORY:  reports that he has been smoking Cigarettes.  He has been smoking about 0.50 packs per day. He has never used smokeless tobacco. He reports that he does not drink alcohol or use drugs.  ALLERGIES: Review of patient's allergies indicates no known allergies.  MEDICATIONS:  Current Outpatient Prescriptions  Medication Sig Dispense Refill  . ADVAIR DISKUS 100-50 MCG/DOSE AEPB inhale 1 dose by mouth twice a day  0  . calcium-vitamin D (OSCAL WITH D) 500-200 MG-UNIT per tablet Take 1 tablet by mouth daily with breakfast.     . Leuprolide Acetate (LUPRON IJ) Inject 1 mL as directed every 3 (three) months.    . Multiple Vitamin (MULTIVITAMIN) tablet Take 1 tablet by mouth daily.    . pravastatin (PRAVACHOL) 20 MG tablet Take 20 mg by mouth daily.    . Vitamin D, Ergocalciferol, (DRISDOL) 50000 UNITS CAPS capsule Take 50,000 Units by mouth once a week. Fridays  0   No current facility-administered medications for this encounter.     ECOG PERFORMANCE STATUS:  1 - Symptomatic but completely ambulatory  REVIEW OF SYSTEMS:  Patient denies any weight loss, fatigue, weakness, fever, chills or night sweats. Patient denies any loss of vision, blurred vision. Patient denies any ringing  of the ears or hearing loss. No irregular heartbeat. Patient denies heart murmur or history of fainting. Patient denies any chest pain or pain radiating to her upper extremities. Patient denies  any shortness of breath, difficulty breathing at night, cough or hemoptysis. Patient denies any swelling in the lower legs. Patient denies any nausea vomiting, vomiting of blood, or coffee ground material in the vomitus. Patient denies any stomach pain. Patient states has had normal bowel movements no significant constipation or diarrhea. Patient denies  any dysuria, hematuria or significant nocturia. Patient denies any problems walking, swelling in the joints or loss of balance. Patient denies any skin changes, loss of hair or loss of weight. Patient denies any excessive worrying or anxiety or significant depression. Patient denies any problems with insomnia. Patient denies excessive thirst, polyuria, polydipsia. Patient denies any swollen glands, patient denies easy bruising or easy bleeding. Patient denies any recent infections, allergies or URI. Patient "s visual fields have not changed significantly in recent time.    PHYSICAL EXAM: BP 121/80   Pulse (!) 109   Temp 98.2 F (36.8 C)   Wt 172 lb 8.2 oz (78.3 kg)   BMI 23.40 kg/m  Thin male in NAD. Deep palpation of the spine does not elicit pain. Range of motion of his lower extremities does not elicit pain. Well-developed well-nourished patient in NAD. HEENT reveals PERLA, EOMI, discs not visualized.  Oral cavity is clear. No oral mucosal lesions are identified. Neck is clear without evidence of cervical or supraclavicular adenopathy. Lungs are clear to A&P. Cardiac examination is essentially unremarkable with regular rate and rhythm without murmur rub or thrill. Abdomen is benign with no organomegaly or masses noted. Motor sensory and DTR levels are equal and symmetric in the upper and lower extremities. Cranial nerves II through XII are grossly intact. Proprioception is intact. No peripheral adenopathy or edema is identified. No motor or sensory levels are noted. Crude visual fields are within normal range.  LABORATORY DATA: PSA levels are reviewed   RADIOLOGY RESULTS:Bone scan was ordered  IMPRESSION: Progressive hormone resistant prostate cancer stage IV in 80 year old male.   PLAN: At this time of ordered a bone scan to determine exact significance of his bone metastases metastatic disease. I will discuss the case personally with medical oncology. The choice at this time is to go ahead  with systemic chemotherapy which the patient is reluctant to do versus radium 225 treatments. Risks and benefits of radium 225 which would be 6 monthly injections were discussed with the patient and his wife. I will see him back in follow-up after his bone scan to review those results and have a firm answer as to her treatment plan at that point after discussions with medical oncology.  I would like to take this opportunity to thank you for allowing me to participate in the care of your patient.Armstead Peaks., MD

## 2016-01-18 NOTE — Patient Instructions (Signed)
Docetaxel injection  What is this medicine?  DOCETAXEL (doe se TAX el) is a chemotherapy drug. It targets fast dividing cells, like cancer cells, and causes these cells to die. This medicine is used to treat many types of cancers like breast cancer, certain stomach cancers, head and neck cancer, lung cancer, and prostate cancer.  This medicine may be used for other purposes; ask your health care provider or pharmacist if you have questions.  What should I tell my health care provider before I take this medicine?  They need to know if you have any of these conditions:  -infection (especially a virus infection such as chickenpox, cold sores, or herpes)  -liver disease  -low blood counts, like low white cell, platelet, or red cell counts  -an unusual or allergic reaction to docetaxel, polysorbate 80, other chemotherapy agents, other medicines, foods, dyes, or preservatives  -pregnant or trying to get pregnant  -breast-feeding  How should I use this medicine?  This drug is given as an infusion into a vein. It is administered in a hospital or clinic by a specially trained health care professional.  Talk to your pediatrician regarding the use of this medicine in children. Special care may be needed.  Overdosage: If you think you have taken too much of this medicine contact a poison control center or emergency room at once.  NOTE: This medicine is only for you. Do not share this medicine with others.  What if I miss a dose?  It is important not to miss your dose. Call your doctor or health care professional if you are unable to keep an appointment.  What may interact with this medicine?  -cyclosporine  -erythromycin  -ketoconazole  -medicines to increase blood counts like filgrastim, pegfilgrastim, sargramostim  -vaccines  Talk to your doctor or health care professional before taking any of these medicines:  -acetaminophen  -aspirin  -ibuprofen  -ketoprofen  -naproxen  This list may not describe all possible interactions.  Give your health care provider a list of all the medicines, herbs, non-prescription drugs, or dietary supplements you use. Also tell them if you smoke, drink alcohol, or use illegal drugs. Some items may interact with your medicine.  What should I watch for while using this medicine?  Your condition will be monitored carefully while you are receiving this medicine. You will need important blood work done while you are taking this medicine.  This drug may make you feel generally unwell. This is not uncommon, as chemotherapy can affect healthy cells as well as cancer cells. Report any side effects. Continue your course of treatment even though you feel ill unless your doctor tells you to stop.  In some cases, you may be given additional medicines to help with side effects. Follow all directions for their use.  Call your doctor or health care professional for advice if you get a fever, chills or sore throat, or other symptoms of a cold or flu. Do not treat yourself. This drug decreases your body's ability to fight infections. Try to avoid being around people who are sick.  This medicine may increase your risk to bruise or bleed. Call your doctor or health care professional if you notice any unusual bleeding.  This medicine may contain alcohol in the product. You may get drowsy or dizzy. Do not drive, use machinery, or do anything that needs mental alertness until you know how this medicine affects you. Do not stand or sit up quickly, especially if you are an older   patient. This reduces the risk of dizzy or fainting spells. Avoid alcoholic drinks.  Do not become pregnant while taking this medicine. Women should inform their doctor if they wish to become pregnant or think they might be pregnant. There is a potential for serious side effects to an unborn child. Talk to your health care professional or pharmacist for more information. Do not breast-feed an infant while taking this medicine.  What side effects may I notice  from receiving this medicine?  Side effects that you should report to your doctor or health care professional as soon as possible:  -allergic reactions like skin rash, itching or hives, swelling of the face, lips, or tongue  -low blood counts - This drug may decrease the number of white blood cells, red blood cells and platelets. You may be at increased risk for infections and bleeding.  -signs of infection - fever or chills, cough, sore throat, pain or difficulty passing urine  -signs of decreased platelets or bleeding - bruising, pinpoint red spots on the skin, black, tarry stools, nosebleeds  -signs of decreased red blood cells - unusually weak or tired, fainting spells, lightheadedness  -breathing problems  -fast or irregular heartbeat  -low blood pressure  -mouth sores  -nausea and vomiting  -pain, swelling, redness or irritation at the injection site  -pain, tingling, numbness in the hands or feet  -swelling of the ankle, feet, hands  -weight gain  Side effects that usually do not require medical attention (report to your prescriber or health care professional if they continue or are bothersome):  -bone pain  -complete hair loss including hair on your head, underarms, pubic hair, eyebrows, and eyelashes  -diarrhea  -excessive tearing  -changes in the color of fingernails  -loosening of the fingernails  -nausea  -muscle pain  -red flush to skin  -sweating  -weak or tired  This list may not describe all possible side effects. Call your doctor for medical advice about side effects. You may report side effects to FDA at 1-800-FDA-1088.  Where should I keep my medicine?  This drug is given in a hospital or clinic and will not be stored at home.  NOTE: This sheet is a summary. It may not cover all possible information. If you have questions about this medicine, talk to your doctor, pharmacist, or health care provider.      2016, [employer]. (2014-05-10 16:04:57)

## 2016-01-19 ENCOUNTER — Inpatient Hospital Stay: Payer: Medicare Other

## 2016-01-19 ENCOUNTER — Encounter
Admission: RE | Admit: 2016-01-19 | Discharge: 2016-01-19 | Disposition: A | Discharge status: Home / Self Care | Payer: Medicare Other | Source: Ambulatory Visit | Attending: Radiation Oncology | Admitting: Radiation Oncology

## 2016-01-19 DIAGNOSIS — C61 Malignant neoplasm of prostate: Secondary | ICD-10-CM | POA: Diagnosis not present

## 2016-01-19 DIAGNOSIS — C7951 Secondary malignant neoplasm of bone: Secondary | ICD-10-CM | POA: Diagnosis present

## 2016-01-19 MED ORDER — TECHNETIUM TC 99M MEDRONATE IV KIT
25.0000 | PACK | Freq: Once | INTRAVENOUS | Status: AC | PRN
  Administered 2016-01-19: 23.763 via INTRAVENOUS

## 2016-01-20 ENCOUNTER — Inpatient Hospital Stay: Payer: Medicare Other

## 2016-01-20 ENCOUNTER — Inpatient Hospital Stay (HOSPITAL_BASED_OUTPATIENT_CLINIC_OR_DEPARTMENT_OTHER): Payer: Medicare Other | Admitting: Internal Medicine

## 2016-01-20 ENCOUNTER — Other Ambulatory Visit: Payer: Self-pay

## 2016-01-20 VITALS — BP 127/72 | HR 74 | Temp 98.2°F | Resp 18 | Ht 72.0 in | Wt 178.0 lb

## 2016-01-20 DIAGNOSIS — R011 Cardiac murmur, unspecified: Secondary | ICD-10-CM

## 2016-01-20 DIAGNOSIS — M869 Osteomyelitis, unspecified: Secondary | ICD-10-CM

## 2016-01-20 DIAGNOSIS — C61 Malignant neoplasm of prostate: Secondary | ICD-10-CM

## 2016-01-20 DIAGNOSIS — C7951 Secondary malignant neoplasm of bone: Secondary | ICD-10-CM

## 2016-01-20 DIAGNOSIS — J45909 Unspecified asthma, uncomplicated: Secondary | ICD-10-CM

## 2016-01-20 DIAGNOSIS — R0602 Shortness of breath: Secondary | ICD-10-CM

## 2016-01-20 DIAGNOSIS — Z79899 Other long term (current) drug therapy: Secondary | ICD-10-CM | POA: Diagnosis not present

## 2016-01-20 DIAGNOSIS — E785 Hyperlipidemia, unspecified: Secondary | ICD-10-CM

## 2016-01-20 DIAGNOSIS — F17210 Nicotine dependence, cigarettes, uncomplicated: Secondary | ICD-10-CM

## 2016-01-20 DIAGNOSIS — Z923 Personal history of irradiation: Secondary | ICD-10-CM

## 2016-01-20 LAB — CBC WITH DIFFERENTIAL/PLATELET
BASOS ABS: 0 10*3/uL (ref 0–0.1)
Basophils Relative: 1 %
Eosinophils Absolute: 0.1 10*3/uL (ref 0–0.7)
Eosinophils Relative: 2 %
HCT: 33.3 % — ABNORMAL LOW (ref 40.0–52.0)
HEMOGLOBIN: 11.6 g/dL — AB (ref 13.0–18.0)
LYMPHS ABS: 1.6 10*3/uL (ref 1.0–3.6)
LYMPHS PCT: 37 %
MCH: 32.1 pg (ref 26.0–34.0)
MCHC: 34.7 g/dL (ref 32.0–36.0)
MCV: 92.5 fL (ref 80.0–100.0)
Monocytes Absolute: 0.4 10*3/uL (ref 0.2–1.0)
Monocytes Relative: 9 %
NEUTROS PCT: 51 %
Neutro Abs: 2.2 10*3/uL (ref 1.4–6.5)
Platelets: 186 10*3/uL (ref 150–440)
RBC: 3.61 MIL/uL — AB (ref 4.40–5.90)
RDW: 13.8 % (ref 11.5–14.5)
WBC: 4.3 10*3/uL (ref 3.8–10.6)

## 2016-01-20 LAB — COMPREHENSIVE METABOLIC PANEL
ALK PHOS: 175 U/L — AB (ref 38–126)
ALT: 14 U/L — AB (ref 17–63)
AST: 19 U/L (ref 15–41)
Albumin: 4.3 g/dL (ref 3.5–5.0)
Anion gap: 5 (ref 5–15)
BUN: 16 mg/dL (ref 6–20)
CALCIUM: 8.9 mg/dL (ref 8.9–10.3)
CO2: 27 mmol/L (ref 22–32)
CREATININE: 0.51 mg/dL — AB (ref 0.61–1.24)
Chloride: 106 mmol/L (ref 101–111)
GFR calc non Af Amer: >60 mL/min (ref >60)
Glucose, Bld: 101 mg/dL — ABNORMAL HIGH (ref 65–99)
Potassium: 4.3 mmol/L (ref 3.5–5.1)
SODIUM: 138 mmol/L (ref 135–145)
Total Bilirubin: 0.6 mg/dL (ref 0.3–1.2)
Total Protein: 7.4 g/dL (ref 6.5–8.1)

## 2016-01-20 LAB — PSA: PSA: 296 ng/mL — ABNORMAL HIGH (ref 0.00–4.00)

## 2016-01-20 MED ORDER — PROCHLORPERAZINE MALEATE 10 MG PO TABS: 10.0000 mg | ORAL_TABLET | Freq: Four times a day (QID) | ORAL | 1 refills | Status: DC | PRN

## 2016-01-20 MED ORDER — ONDANSETRON HCL 8 MG PO TABS: ORAL_TABLET | ORAL | 1 refills | Status: AC

## 2016-01-20 MED ORDER — DEXAMETHASONE 4 MG PO TABS: ORAL_TABLET | ORAL | 0 refills | Status: DC

## 2016-01-20 NOTE — Assessment & Plan Note (Addendum)
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  May 2017 CT scan shows progressive lesions in the bone and no evidence of any visceral metastases.  PSA SEP 7th 238 [from 99 in July 2017]. Currently- s/p provenege 2 of planned 3. Hold off further provenge.   # given the  Significant jump in PSA- recommend chemotherapy rather than Xofigo. Bone scan- Discussed with radiation oncology Dr. Donella Stade. He agrees.  # recommend docetaxel 20 mg/mweekly rather than every 3 weeks- given the concerns for side effects. Recommend dexamethasone 4 mg twice a day for 3 days starting date for chemotherapy. Prescriptions for nausea medication again.   # follow-up in approximately 2 weeks/CBC CMP- starting 9/18; weekly CBC BMP.

## 2016-01-20 NOTE — Progress Notes (Signed)
[place] OFFICE PROGRESS NOTE  Patient Care Team: Lenard Simmer, MD as PCP - General (Endocrinology)  No matching staging information was found for the patient.   Oncology History   1. Carcinoma of prostate diagnosis in 2006.  Had received radiation therapy, external beam to prostate and pelvic lymph nodes.  Had Gleason 9 (4+5.)  PainBaseline PSA was 13. 2. Received Lupron injection after her radiation therapy also.  PSA was going up.  Last year or so received Lupron injection in September but did not have any followup.   3. Recently (this August, 2012) PSA was found to be 17. Patient was started on Lupron from September of 2012. 4. Patient has progressive disease by PSA criteria (October, 2013) 5. Patient was started on Zytiga and prednisone in November of 2013. 6.traumatic hip fracture ((right femur) status post internally fixed ,no evidence of metastatic disease(May of 2014) 7.bone scan in July of 2014 at outside institutions shows metastases to left scapula and left 11th rib/.. 8.progressive On ZYTIGA by PSA criteria.  9[name], September of 2015- Progression- Jan 2016  # AUG 11th- Provenge 2 of planned 3 [sec to logistical issues]     Prostate cancer metastatic to bone [facility])   10/19/2015 Initial Diagnosis    Prostate cancer metastatic to bone [facility])        INTERVAL HISTORY:  Preston Wilson 80 y.o.  male pleasant patient above history of Metastatic prostate cancer status post multiple lines of therapy catheter system is here for follow-up- most recently on Provenge status post 2 out of planned 3 treatments.   In the interim patient was evaluated by radiation oncology- for now recommend holding xofigo- as patient is asymptomatic from this bone metastases. He had a recent bone scan with Dr. Donella Stade.  Continues to feel well overall. Denies any pain. No nausea no vomiting.  not taking any pain medications.  REVIEW OF SYSTEMS:  A complete 10 point  review of system is done which is negative except mentioned above/history of present illness.   PAST MEDICAL HISTORY :  Past Medical History:  Diagnosis Date  . Asthma   . Heart murmur   . Hyperlipidemia   . Hypocalcemia   . Osteomyelitis ([place])   . Prostate cancer ([place])   . SOB (shortness of breath)     PAST SURGICAL HISTORY :   Past Surgical History:  Procedure Laterality Date  . COLONOSCOPY WITH PROPOFOL N/A 04/12/2015   Procedure: COLONOSCOPY WITH PROPOFOL;  Surgeon: Lollie Sails, MD;  Location: [facility] ENDOSCOPY;  Service: Endoscopy;  Laterality: N/A;  . EXCISIONAL HEMORRHOIDECTOMY    . TONSILLECTOMY      FAMILY HISTORY :  No family history on file.  SOCIAL HISTORY:   Social History  Substance Use Topics  . Smoking status: Current Every Day Smoker    Packs/day: 0.50    Types: Cigarettes  . Smokeless tobacco: Never Used  . Alcohol use No    ALLERGIES:  has No Known Allergies.  MEDICATIONS:  Current Outpatient Prescriptions  Medication Sig Dispense Refill  . ADVAIR DISKUS 100-50 MCG/DOSE AEPB inhale 1 dose by mouth twice a day  0  . calcium-vitamin D (OSCAL WITH D) 500-200 MG-UNIT per tablet Take 1 tablet by mouth daily with breakfast.     . Leuprolide Acetate (LUPRON IJ) Inject 1 mL as directed every 3 (three) months.    . Multiple Vitamin (MULTIVITAMIN) tablet Take 1 tablet by mouth daily.    . pravastatin (  PRAVACHOL) 20 MG tablet Take 20 mg by mouth daily.    . Vitamin D, Ergocalciferol, (DRISDOL) 50000 UNITS CAPS capsule Take 50,000 Units by mouth once a week. Fridays  0  . dexamethasone (DECADRON) 4 MG tablet Take 1 pill every 12 hours; start the day prior to chemo x 3 days. 60 tablet 0  . ondansetron (ZOFRAN) 8 MG tablet 1 pill every 8 hours as needed for nausea/vomitting 40 tablet 1  . prochlorperazine (COMPAZINE) 10 MG tablet Take 1 tablet (10 mg total) by mouth every 6 (six) hours as needed for nausea or vomiting. 40 tablet 1   No current  facility-administered medications for this visit.     PHYSICAL EXAMINATION: ECOG PERFORMANCE STATUS: 0 - Asymptomatic  BP 127/72 (BP Location: Right Arm, Patient Position: Sitting)   Pulse 74   Temp 98.2 F (36.8 C) (Tympanic)   Resp 18   Ht 6' (1.829 m)   Wt 178 lb (80.7 kg)   BMI 24.14 kg/m   Filed Weights   01/20/16 0851  Weight: 178 lb (80.7 kg)    GENERAL: Well-nourished well-developed; Alert, no distress and comfortable.   Accompanied by family. EYES: no pallor or icterus OROPHARYNX: no thrush or ulceration; good dentition  NECK: supple, no masses felt LYMPH:  no palpable lymphadenopathy in the cervical, axillary or inguinal regions LUNGS: clear to auscultation and  No wheeze or crackles HEART/CVS: regular rate & rhythm and no murmurs; No lower extremity edema ABDOMEN:abdomen soft, non-tender and normal bowel sounds Musculoskeletal:no cyanosis of digits and no clubbing  PSYCH: alert & oriented x 3 with fluent speech NEURO: no focal motor/sensory deficits SKIN:  no rashes or significant lesions  LABORATORY DATA:  I have reviewed the data as listed    Component Value Date/Time   NA 138 01/20/2016 0821   NA 138 08/25/2014 1442   K 4.3 01/20/2016 0821   K 4.1 08/25/2014 1442   CL 106 01/20/2016 0821   CL 106 08/25/2014 1442   CO2 27 01/20/2016 0821   CO2 25 08/25/2014 1442   GLUCOSE 101 (H) 01/20/2016 0821   GLUCOSE 138 (H) 08/25/2014 1442   BUN 16 01/20/2016 0821   BUN 19 08/25/2014 1442   CREATININE 0.51 (L) 01/20/2016 0821   CREATININE 0.53 (L) 08/25/2014 1442   CALCIUM 8.9 01/20/2016 0821   CALCIUM 9.0 08/25/2014 1442   PROT 7.4 01/20/2016 0821   PROT 6.8 08/25/2014 1442   ALBUMIN 4.3 01/20/2016 0821   ALBUMIN 3.9 08/25/2014 1442   AST 19 01/20/2016 0821   AST 18 08/25/2014 1442   ALT 14 (L) 01/20/2016 0821   ALT 13 (L) 08/25/2014 1442   ALKPHOS 175 (H) 01/20/2016 0821   ALKPHOS 73 08/25/2014 1442   BILITOT 0.6 01/20/2016 0821   BILITOT 0.7  08/25/2014 1442   GFRNONAA >60 01/20/2016 0821   GFRNONAA >60 08/25/2014 1442   GFRAA >60 01/20/2016 0821   GFRAA >60 08/25/2014 1442    No results found for: SPEP, UPEP  Lab Results  Component Value Date   WBC 4.3 01/20/2016   NEUTROABS 2.2 01/20/2016   HGB 11.6 (L) 01/20/2016   HCT 33.3 (L) 01/20/2016   MCV 92.5 01/20/2016   PLT 186 01/20/2016      Chemistry      Component Value Date/Time   NA 138 01/20/2016 0821   NA 138 08/25/2014 1442   K 4.3 01/20/2016 0821   K 4.1 08/25/2014 1442   CL 106 01/20/2016 [id number]  CL 106 08/25/2014 1442   CO2 27 01/20/2016 0821   CO2 25 08/25/2014 1442   BUN 16 01/20/2016 0821   BUN 19 08/25/2014 1442   CREATININE 0.51 (L) 01/20/2016 0821   CREATININE 0.53 (L) 08/25/2014 1442      Component Value Date/Time   CALCIUM 8.9 01/20/2016 0821   CALCIUM 9.0 08/25/2014 1442   ALKPHOS 175 (H) 01/20/2016 0821   ALKPHOS 73 08/25/2014 1442   AST 19 01/20/2016 0821   AST 18 08/25/2014 1442   ALT 14 (L) 01/20/2016 0821   ALT 13 (L) 08/25/2014 1442   BILITOT 0.6 01/20/2016 0821   BILITOT 0.7 08/25/2014 1442       RADIOGRAPHIC STUDIES: I have personally reviewed the radiological images as listed and agreed with the findings in the report. Nm Bone Scan Whole Body  Result Date: 01/19/2016 CLINICAL DATA:  Metastatic prostate cancer to bone EXAM: NUCLEAR MEDICINE WHOLE BODY BONE SCAN TECHNIQUE: Whole body anterior and posterior images were obtained approximately 3 hours after intravenous injection of radiopharmaceutical. RADIOPHARMACEUTICALS:  23.76 mCi Technetium-6m MDP IV COMPARISON:  04/22/2015 Radiographic correlation:  CT abdomen and pelvis 09/21/2015 FINDINGS: Numerous foci of abnormal increased osseous accumulation of tracer identified throughout the axial and appendicular skeleton compatible with progressive osseous metastatic disease. These include calvarium, cervical/thoracic/lumbar spine, sternum, BILATERAL scapula, BILATERAL humeri,  BILATERAL femora, anterior/posterior ribs bilaterally, and pelvis. The numerous sites of abnormal tracer accumulation within the humeri and femora are new since the previous exam. Uptake at LEFT ankle likely posttraumatic for degenerative, less intense than seen previously. IMPRESSION: Widespread osseous metastatic disease progressive since 2016 as above. Electronically Signed   By: Lavonia Dana M.D.   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  May 2017 CT scan shows progressive lesions in the bone and no evidence of any visceral metastases.  PSA SEP 7th 238 [from 99 in July 2017]. Currently- s/p provenege 2 of planned 3. Hold off further provenge.   # given the  Significant jump in PSA- recommend chemotherapy rather than Xofigo. Bone scan- Discussed with radiation oncology Dr. Donella Stade. He agrees.  # recommend docetaxel 20 mg/mweekly rather than every 3 weeks- given the concerns for side effects. Recommend dexamethasone 4 mg twice a day for 3 days starting date for chemotherapy. Prescriptions for nausea medication again.   # follow-up in approximately 2 weeks/CBC CMP- starting 9/18; weekly CBC BMP.   Orders Placed This Encounter  Procedures  . CBC with Differential    Standing Status:   Standing    Number of Occurrences:   20    Standing Expiration Date:   01/19/2017  . Basic metabolic panel    Standing Status:   Future    Standing Expiration Date:   01/19/2017  . Comprehensive metabolic panel    Standing Status:   Future    Standing Expiration Date:   01/19/2017  . Basic metabolic panel    Standing Status:   Future    Standing Expiration Date:   01/19/2017        Cammie Sickle, MD 01/20/2016 12:49 PM

## 2016-01-20 NOTE — Progress Notes (Signed)
START ON PATHWAY REGIMEN - Prostate  POS37: Docetaxel 75 mg/m2 q21 Days + Prednisone 5 mg BID Until Progression or Toxicity   A cycle is every 21 days:     Docetaxel (Taxotere(R)) 75 mg/m2 in 250 mL NS IV over one hour Dose Mod: None     Prednisone 5 mg orally twice daily Dose Mod: None Additional Orders: Premedicate with dexamethasone 8 mg PO bid for three days beginning 1 day prior to therapy  **Always confirm dose/schedule in your pharmacy ordering system**    Patient Characteristics: Adenocarcinoma, Metastatic, Castration Resistant, Symptomatic/Poor Response to Prior Hormonal Therapy, Docetaxel Eligible AJCC T Stage: X AJCC Stage Grouping: IV Current radiographic evidence of distant metastasis? Yes PSA: X Gleason Primary: X Gleason Secondary: X Gleason Score: X AJCC M Stage: X AJCC N Stage: X Would you be surprised if this patient died  in the next year? I would be surprised if this patient died in the next year  Intent of Therapy: Non-Curative / Palliative Intent, Discussed with Patient 

## 2016-01-21 LAB — TESTOSTERONE

## 2016-01-23 ENCOUNTER — Other Ambulatory Visit: Payer: Self-pay | Admitting: Internal Medicine

## 2016-01-23 ENCOUNTER — Ambulatory Visit: Payer: Self-pay | Admitting: Radiation Oncology

## 2016-01-23 ENCOUNTER — Inpatient Hospital Stay: Payer: Medicare Other

## 2016-01-23 VITALS — BP 152/79 | HR 75 | Temp 98.5°F | Resp 20

## 2016-01-23 DIAGNOSIS — C61 Malignant neoplasm of prostate: Secondary | ICD-10-CM

## 2016-01-23 DIAGNOSIS — C7951 Secondary malignant neoplasm of bone: Principal | ICD-10-CM

## 2016-01-23 MED ORDER — SODIUM CHLORIDE 0.9 % IV SOLN
Freq: Once | INTRAVENOUS | Status: AC
Start: 2016-01-23 — End: 2016-01-23
  Administered 2016-01-23: 09:00:00 via INTRAVENOUS
  Filled 2016-01-23: qty 1000

## 2016-01-23 MED ORDER — SODIUM CHLORIDE 0.9 % IV SOLN
10.0000 mg | Freq: Once | INTRAVENOUS | Status: AC
  Administered 2016-01-23: 10 mg via INTRAVENOUS
  Filled 2016-01-23: qty 1

## 2016-01-23 MED ORDER — DIPHENHYDRAMINE HCL 50 MG/ML IJ SOLN
25.0000 mg | Freq: Once | INTRAMUSCULAR | Status: AC
  Administered 2016-01-23: 25 mg via INTRAVENOUS
  Filled 2016-01-23: qty 1

## 2016-01-23 MED ORDER — DEXTROSE 5 % IV SOLN
20.0000 mg/m2 | Freq: Once | INTRAVENOUS | Status: AC
  Administered 2016-01-23: 40 mg via INTRAVENOUS
  Filled 2016-01-23: qty 4

## 2016-01-24 ENCOUNTER — Institutional Professional Consult (permissible substitution): Payer: Self-pay | Admitting: Radiation Oncology

## 2016-01-30 ENCOUNTER — Inpatient Hospital Stay: Payer: Medicare Other

## 2016-01-30 VITALS — BP 119/70 | HR 60 | Resp 20

## 2016-01-30 DIAGNOSIS — C61 Malignant neoplasm of prostate: Secondary | ICD-10-CM | POA: Diagnosis not present

## 2016-01-30 DIAGNOSIS — C7951 Secondary malignant neoplasm of bone: Principal | ICD-10-CM

## 2016-01-30 LAB — COMPREHENSIVE METABOLIC PANEL
ALBUMIN: 4.3 g/dL (ref 3.5–5.0)
ALK PHOS: 224 U/L — AB (ref 38–126)
ALT: 15 U/L — ABNORMAL LOW (ref 17–63)
ANION GAP: 6 (ref 5–15)
AST: 21 U/L (ref 15–41)
BUN: 18 mg/dL (ref 6–20)
CALCIUM: 9.2 mg/dL (ref 8.9–10.3)
CHLORIDE: 105 mmol/L (ref 101–111)
CO2: 24 mmol/L (ref 22–32)
Creatinine, Ser: 0.57 mg/dL — ABNORMAL LOW (ref 0.61–1.24)
GFR calc Af Amer: >60 mL/min (ref >60)
GFR calc non Af Amer: >60 mL/min (ref >60)
GLUCOSE: 149 mg/dL — AB (ref 65–99)
POTASSIUM: 4.1 mmol/L (ref 3.5–5.1)
SODIUM: 135 mmol/L (ref 135–145)
Total Bilirubin: 0.7 mg/dL (ref 0.3–1.2)
Total Protein: 7.3 g/dL (ref 6.5–8.1)

## 2016-01-30 LAB — CBC WITH DIFFERENTIAL/PLATELET
BASOS PCT: 0 %
Basophils Absolute: 0 10*3/uL (ref 0–0.1)
EOS ABS: 0 10*3/uL (ref 0–0.7)
EOS PCT: 0 %
HCT: 33.7 % — ABNORMAL LOW (ref 40.0–52.0)
HEMOGLOBIN: 11.6 g/dL — AB (ref 13.0–18.0)
Lymphocytes Relative: 11 %
Lymphs Abs: 0.7 10*3/uL — ABNORMAL LOW (ref 1.0–3.6)
MCH: 31.7 pg (ref 26.0–34.0)
MCHC: 34.2 g/dL (ref 32.0–36.0)
MCV: 92.5 fL (ref 80.0–100.0)
MONOS PCT: 4 %
Monocytes Absolute: 0.3 10*3/uL (ref 0.2–1.0)
NEUTROS PCT: 85 %
Neutro Abs: 5.9 10*3/uL (ref 1.4–6.5)
PLATELETS: 192 10*3/uL (ref 150–440)
RBC: 3.65 MIL/uL — ABNORMAL LOW (ref 4.40–5.90)
RDW: 13.5 % (ref 11.5–14.5)
WBC: 7 10*3/uL (ref 3.8–10.6)

## 2016-01-30 MED ORDER — SODIUM CHLORIDE 0.9 % IV SOLN
Freq: Once | INTRAVENOUS | Status: AC
  Administered 2016-01-30: 10:00:00 via INTRAVENOUS
  Filled 2016-01-30: qty 1000

## 2016-01-30 MED ORDER — DIPHENHYDRAMINE HCL 50 MG/ML IJ SOLN
25.0000 mg | Freq: Once | INTRAMUSCULAR | Status: AC
  Administered 2016-01-30: 25 mg via INTRAVENOUS
  Filled 2016-01-30: qty 1

## 2016-01-30 MED ORDER — SODIUM CHLORIDE 0.9 % IV SOLN
10.0000 mg | Freq: Once | INTRAVENOUS | Status: AC
  Administered 2016-01-30: 10 mg via INTRAVENOUS
  Filled 2016-01-30 (×2): qty 1

## 2016-01-30 MED ORDER — DOCETAXEL CHEMO INJECTION 160 MG/16ML
20.0000 mg/m2 | Freq: Once | INTRAVENOUS | Status: AC
  Administered 2016-01-30: 40 mg via INTRAVENOUS
  Filled 2016-01-30 (×2): qty 4

## 2016-02-06 ENCOUNTER — Inpatient Hospital Stay: Payer: Medicare Other

## 2016-02-06 ENCOUNTER — Inpatient Hospital Stay (HOSPITAL_BASED_OUTPATIENT_CLINIC_OR_DEPARTMENT_OTHER): Payer: Medicare Other | Admitting: Internal Medicine

## 2016-02-06 ENCOUNTER — Encounter: Payer: Self-pay | Admitting: Internal Medicine

## 2016-02-06 ENCOUNTER — Inpatient Hospital Stay: Payer: Medicare Other | Attending: Internal Medicine

## 2016-02-06 VITALS — BP 145/76 | HR 98 | Temp 97.3°F | Resp 17 | Ht 72.0 in | Wt 173.4 lb

## 2016-02-06 DIAGNOSIS — Z79818 Long term (current) use of other agents affecting estrogen receptors and estrogen levels: Secondary | ICD-10-CM

## 2016-02-06 DIAGNOSIS — C779 Secondary and unspecified malignant neoplasm of lymph node, unspecified: Secondary | ICD-10-CM | POA: Insufficient documentation

## 2016-02-06 DIAGNOSIS — E785 Hyperlipidemia, unspecified: Secondary | ICD-10-CM | POA: Diagnosis not present

## 2016-02-06 DIAGNOSIS — Z23 Encounter for immunization: Secondary | ICD-10-CM | POA: Diagnosis not present

## 2016-02-06 DIAGNOSIS — C61 Malignant neoplasm of prostate: Secondary | ICD-10-CM | POA: Diagnosis not present

## 2016-02-06 DIAGNOSIS — Z923 Personal history of irradiation: Secondary | ICD-10-CM

## 2016-02-06 DIAGNOSIS — F17210 Nicotine dependence, cigarettes, uncomplicated: Secondary | ICD-10-CM | POA: Insufficient documentation

## 2016-02-06 DIAGNOSIS — R0602 Shortness of breath: Secondary | ICD-10-CM

## 2016-02-06 DIAGNOSIS — R112 Nausea with vomiting, unspecified: Secondary | ICD-10-CM | POA: Diagnosis not present

## 2016-02-06 DIAGNOSIS — J45909 Unspecified asthma, uncomplicated: Secondary | ICD-10-CM | POA: Diagnosis not present

## 2016-02-06 DIAGNOSIS — Z5111 Encounter for antineoplastic chemotherapy: Secondary | ICD-10-CM | POA: Insufficient documentation

## 2016-02-06 DIAGNOSIS — Z79899 Other long term (current) drug therapy: Secondary | ICD-10-CM

## 2016-02-06 DIAGNOSIS — R011 Cardiac murmur, unspecified: Secondary | ICD-10-CM | POA: Insufficient documentation

## 2016-02-06 DIAGNOSIS — C7951 Secondary malignant neoplasm of bone: Principal | ICD-10-CM

## 2016-02-06 DIAGNOSIS — R9721 Rising PSA following treatment for malignant neoplasm of prostate: Secondary | ICD-10-CM

## 2016-02-06 LAB — BASIC METABOLIC PANEL
ANION GAP: 7 (ref 5–15)
BUN: 14 mg/dL (ref 6–20)
CALCIUM: 9.3 mg/dL (ref 8.9–10.3)
CHLORIDE: 104 mmol/L (ref 101–111)
CO2: 25 mmol/L (ref 22–32)
Creatinine, Ser: 0.62 mg/dL (ref 0.61–1.24)
GFR calc non Af Amer: >60 mL/min (ref >60)
GLUCOSE: 133 mg/dL — AB (ref 65–99)
POTASSIUM: 4.8 mmol/L (ref 3.5–5.1)
Sodium: 136 mmol/L (ref 135–145)

## 2016-02-06 LAB — CBC WITH DIFFERENTIAL/PLATELET
BASOS ABS: 0 10*3/uL (ref 0–0.1)
BASOS PCT: 1 %
Eosinophils Absolute: 0 10*3/uL (ref 0–0.7)
Eosinophils Relative: 0 %
HEMATOCRIT: 32.6 % — AB (ref 40.0–52.0)
HEMOGLOBIN: 11.4 g/dL — AB (ref 13.0–18.0)
Lymphocytes Relative: 16 %
Lymphs Abs: 0.8 10*3/uL — ABNORMAL LOW (ref 1.0–3.6)
MCH: 32.3 pg (ref 26.0–34.0)
MCHC: 35.1 g/dL (ref 32.0–36.0)
MCV: 92.2 fL (ref 80.0–100.0)
Monocytes Absolute: 0.2 10*3/uL (ref 0.2–1.0)
Monocytes Relative: 4 %
Neutro Abs: 3.9 10*3/uL (ref 1.4–6.5)
Neutrophils Relative %: 79 %
Platelets: 172 10*3/uL (ref 150–440)
RBC: 3.53 MIL/uL — AB (ref 4.40–5.90)
RDW: 14.2 % (ref 11.5–14.5)
WBC: 5 10*3/uL (ref 3.8–10.6)

## 2016-02-06 MED ORDER — DIPHENHYDRAMINE HCL 50 MG/ML IJ SOLN
25.0000 mg | Freq: Once | INTRAMUSCULAR | Status: AC
  Administered 2016-02-06: 25 mg via INTRAVENOUS
  Filled 2016-02-06: qty 1

## 2016-02-06 MED ORDER — DOCETAXEL CHEMO INJECTION 160 MG/16ML
20.0000 mg/m2 | Freq: Once | INTRAVENOUS | Status: AC
  Administered 2016-02-06: 40 mg via INTRAVENOUS
  Filled 2016-02-06: qty 4

## 2016-02-06 MED ORDER — SODIUM CHLORIDE 0.9 % IV SOLN
Freq: Once | INTRAVENOUS | Status: AC
  Administered 2016-02-06: 10:00:00 via INTRAVENOUS
  Filled 2016-02-06: qty 1000

## 2016-02-06 MED ORDER — HEPARIN SOD (PORK) LOCK FLUSH 100 UNIT/ML IV SOLN
500.0000 [IU] | Freq: Once | INTRAVENOUS | Status: DC | PRN
  Filled 2016-02-06: qty 5

## 2016-02-06 MED ORDER — SODIUM CHLORIDE 0.9 % IV SOLN
10.0000 mg | Freq: Once | INTRAVENOUS | Status: AC
  Administered 2016-02-06: 10 mg via INTRAVENOUS
  Filled 2016-02-06: qty 1

## 2016-02-06 MED ORDER — INFLUENZA VAC SPLIT QUAD 0.5 ML IM SUSY
0.5000 mL | PREFILLED_SYRINGE | Freq: Once | INTRAMUSCULAR | Status: AC
  Administered 2016-02-06: 0.5 mL via INTRAMUSCULAR
  Filled 2016-02-06: qty 0.5

## 2016-02-06 NOTE — Assessment & Plan Note (Signed)
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  May 2017 CT scan shows progressive lesions in the bone and no evidence of any visceral metastases.  PSA SEP 7th 238 [from 99 in July 2017]. Currently- on weekly docetaxel s/p 2 treatments.  # Continue weekly docetaxel; #3 today. No major side effects. Labs are adequate.   # follow-up in approximately 2 weeks/CBC CMP/PSA;  weekly CBC BMP

## 2016-02-06 NOTE — Progress Notes (Signed)
[place] OFFICE PROGRESS NOTE  Patient Care Team: Lenard Simmer, MD as PCP - General (Endocrinology)  No matching staging information was found for the patient.   Oncology History   1. Carcinoma of prostate diagnosis in 2006.  Had received radiation therapy, external beam to prostate and pelvic lymph nodes.  Had Gleason 9 (4+5.)  PainBaseline PSA was 13. 2. Received Lupron injection after her radiation therapy also.  PSA was going up.  Last year or so received Lupron injection in September but did not have any followup.   3. Recently (this August, 2012) PSA was found to be 17. Patient was started on Lupron from September of 2012. 4. Patient has progressive disease by PSA criteria (October, 2013) 5. Patient was started on Zytiga and prednisone in November of 2013. 6.traumatic hip fracture ((right femur) status post internally fixed ,no evidence of metastatic disease(May of 2014) 7.bone scan in July of 2014 at outside institutions shows metastases to left scapula and left 11th rib/.. 8.progressive On ZYTIGA by PSA criteria.  9[name], September of 2015- Progression- Jan 2016  # AUG 11th- Provenge 2 of planned 3 [sec to logistical issues]     Prostate cancer metastatic to bone [facility])   10/19/2015 Initial Diagnosis    Prostate cancer metastatic to bone [facility])        INTERVAL HISTORY:  Preston Wilson 80 y.o.  male pleasant patient above history of Metastatic prostate cancer status post multiple lines of therapy Currently on weekly docetaxel is here for follow-up.  Patient status post 2 treatments; initially nausea vomiting. Denies any tingling or numbness. Denies any swelling of the legs. No skin rash.  Continues to feel well overall. Denies any pain. Not taking any pain medications.  REVIEW OF SYSTEMS:  A complete 10 point review of system is done which is negative except mentioned above/history of present illness.   PAST MEDICAL HISTORY :  Past  Medical History:  Diagnosis Date  . Asthma   . Heart murmur   . Hyperlipidemia   . Hypocalcemia   . Osteomyelitis ([place])   . Prostate cancer ([place])   . SOB (shortness of breath)     PAST SURGICAL HISTORY :   Past Surgical History:  Procedure Laterality Date  . COLONOSCOPY WITH PROPOFOL N/A 04/12/2015   Procedure: COLONOSCOPY WITH PROPOFOL;  Surgeon: Lollie Sails, MD;  Location: [facility] ENDOSCOPY;  Service: Endoscopy;  Laterality: N/A;  . EXCISIONAL HEMORRHOIDECTOMY    . TONSILLECTOMY      FAMILY HISTORY :  History reviewed. No pertinent family history.  SOCIAL HISTORY:   Social History  Substance Use Topics  . Smoking status: Current Every Day Smoker    Packs/day: 0.50    Types: Cigarettes  . Smokeless tobacco: Never Used  . Alcohol use No    ALLERGIES:  has No Known Allergies.  MEDICATIONS:  Current Outpatient Prescriptions  Medication Sig Dispense Refill  . ADVAIR DISKUS 100-50 MCG/DOSE AEPB inhale 1 dose by mouth twice a day  0  . calcium-vitamin D (OSCAL WITH D) 500-200 MG-UNIT per tablet Take 1 tablet by mouth daily with breakfast.     . dexamethasone (DECADRON) 4 MG tablet Take 1 pill every 12 hours; start the day prior to chemo x 3 days. 60 tablet 0  . Leuprolide Acetate (LUPRON IJ) Inject 1 mL as directed every 3 (three) months.    . Multiple Vitamin (MULTIVITAMIN) tablet Take 1 tablet by mouth daily.    Marland Kitchen  pravastatin (PRAVACHOL) 20 MG tablet Take 20 mg by mouth daily.    . Vitamin D, Ergocalciferol, (DRISDOL) 50000 UNITS CAPS capsule Take 50,000 Units by mouth once a week. Fridays  0  . ondansetron (ZOFRAN) 8 MG tablet 1 pill every 8 hours as needed for nausea/vomitting (Patient not taking: Reported on 02/06/2016) 40 tablet 1  . prochlorperazine (COMPAZINE) 10 MG tablet Take 1 tablet (10 mg total) by mouth every 6 (six) hours as needed for nausea or vomiting. (Patient not taking: Reported on 02/06/2016) 40 tablet 1   Current Facility-Administered Medications   Medication Dose Route Frequency Provider Last Rate Last Dose  . Influenza vac split quadrivalent PF (FLUARIX) injection 0.5 mL  0.5 mL Intramuscular Once Cammie Sickle, MD        PHYSICAL EXAMINATION: ECOG PERFORMANCE STATUS: 0 - Asymptomatic  BP (!) 145/76 (BP Location: Left Arm, Patient Position: Sitting)   Pulse 98   Temp 97.3 F (36.3 C) (Tympanic)   Resp 17   Ht 6' (1.829 m)   Wt 173 lb 6.4 oz (78.7 kg)   BMI 23.52 kg/m   Filed Weights   02/06/16 0906  Weight: 173 lb 6.4 oz (78.7 kg)    GENERAL: Well-nourished well-developed; Alert, no distress and comfortable.   Accompanied by family. EYES: no pallor or icterus OROPHARYNX: no thrush or ulceration; good dentition  NECK: supple, no masses felt LYMPH:  no palpable lymphadenopathy in the cervical, axillary or inguinal regions LUNGS: clear to auscultation and  No wheeze or crackles HEART/CVS: regular rate & rhythm and no murmurs; No lower extremity edema ABDOMEN:abdomen soft, non-tender and normal bowel sounds Musculoskeletal:no cyanosis of digits and no clubbing  PSYCH: alert & oriented x 3 with fluent speech NEURO: no focal motor/sensory deficits SKIN:  no rashes or significant lesions  LABORATORY DATA:  I have reviewed the data as listed    Component Value Date/Time   NA 135 01/30/2016 0940   NA 138 08/25/2014 1442   K 4.1 01/30/2016 0940   K 4.1 08/25/2014 1442   CL 105 01/30/2016 0940   CL 106 08/25/2014 1442   CO2 24 01/30/2016 0940   CO2 25 08/25/2014 1442   GLUCOSE 149 (H) 01/30/2016 0940   GLUCOSE 138 (H) 08/25/2014 1442   BUN 18 01/30/2016 0940   BUN 19 08/25/2014 1442   CREATININE 0.57 (L) 01/30/2016 0940   CREATININE 0.53 (L) 08/25/2014 1442   CALCIUM 9.2 01/30/2016 0940   CALCIUM 9.0 08/25/2014 1442   PROT 7.3 01/30/2016 0940   PROT 6.8 08/25/2014 1442   ALBUMIN 4.3 01/30/2016 0940   ALBUMIN 3.9 08/25/2014 1442   AST 21 01/30/2016 0940   AST 18 08/25/2014 1442   ALT 15 (L) 01/30/2016  0940   ALT 13 (L) 08/25/2014 1442   ALKPHOS 224 (H) 01/30/2016 0940   ALKPHOS 73 08/25/2014 1442   BILITOT 0.7 01/30/2016 0940   BILITOT 0.7 08/25/2014 1442   GFRNONAA >60 01/30/2016 0940   GFRNONAA >60 08/25/2014 1442   GFRAA >60 01/30/2016 0940   GFRAA >60 08/25/2014 1442    No results found for: SPEP, UPEP  Lab Results  Component Value Date   WBC 5.0 02/06/2016   NEUTROABS 3.9 02/06/2016   HGB 11.4 (L) 02/06/2016   HCT 32.6 (L) 02/06/2016   MCV 92.2 02/06/2016   PLT 172 02/06/2016      Chemistry      Component Value Date/Time   NA 135 01/30/2016 0940   NA 138  08/25/2014 1442   K 4.1 01/30/2016 0940   K 4.1 08/25/2014 1442   CL 105 01/30/2016 0940   CL 106 08/25/2014 1442   CO2 24 01/30/2016 0940   CO2 25 08/25/2014 1442   BUN 18 01/30/2016 0940   BUN 19 08/25/2014 1442   CREATININE 0.57 (L) 01/30/2016 0940   CREATININE 0.53 (L) 08/25/2014 1442      Component Value Date/Time   CALCIUM 9.2 01/30/2016 0940   CALCIUM 9.0 08/25/2014 1442   ALKPHOS 224 (H) 01/30/2016 0940   ALKPHOS 73 08/25/2014 1442   AST 21 01/30/2016 0940   AST 18 08/25/2014 1442   ALT 15 (L) 01/30/2016 0940   ALT 13 (L) 08/25/2014 1442   BILITOT 0.7 01/30/2016 0940   BILITOT 0.7 08/25/2014 1442       RADIOGRAPHIC STUDIES: I have personally reviewed the radiological images as listed and agreed with the findings in the report. No results found.   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  May 2017 CT scan shows progressive lesions in the bone and no evidence of any visceral metastases.  PSA SEP 7th 238 [from 99 in July 2017]. Currently- on weekly docetaxel s/p 2 treatments.  # Continue weekly docetaxel; #3 today. No major side effects. Labs are adequate.   # follow-up in approximately 2 weeks/CBC CMP/PSA;  weekly CBC BMP  No orders of the defined types were placed in this encounter.     Cammie Sickle, MD 02/06/2016 9:19 AM

## 2016-02-09 ENCOUNTER — Other Ambulatory Visit: Payer: Self-pay

## 2016-02-09 ENCOUNTER — Ambulatory Visit: Payer: Self-pay | Admitting: Internal Medicine

## 2016-02-09 ENCOUNTER — Ambulatory Visit: Payer: Self-pay

## 2016-02-10 ENCOUNTER — Other Ambulatory Visit: Payer: Self-pay

## 2016-02-13 ENCOUNTER — Inpatient Hospital Stay: Payer: Medicare Other

## 2016-02-13 ENCOUNTER — Ambulatory Visit: Payer: Self-pay

## 2016-02-13 ENCOUNTER — Ambulatory Visit: Payer: Self-pay | Admitting: Internal Medicine

## 2016-02-13 VITALS — BP 132/65 | HR 71 | Temp 98.2°F | Resp 18

## 2016-02-13 DIAGNOSIS — C7951 Secondary malignant neoplasm of bone: Principal | ICD-10-CM

## 2016-02-13 DIAGNOSIS — C61 Malignant neoplasm of prostate: Secondary | ICD-10-CM

## 2016-02-13 LAB — CBC WITH DIFFERENTIAL/PLATELET
BASOS ABS: 0 10*3/uL (ref 0–0.1)
BASOS PCT: 0 %
EOS PCT: 0 %
Eosinophils Absolute: 0 10*3/uL (ref 0–0.7)
HCT: 32.2 % — ABNORMAL LOW (ref 40.0–52.0)
Hemoglobin: 11.1 g/dL — ABNORMAL LOW (ref 13.0–18.0)
LYMPHS PCT: 15 %
Lymphs Abs: 1 10*3/uL (ref 1.0–3.6)
MCH: 31.9 pg (ref 26.0–34.0)
MCHC: 34.5 g/dL (ref 32.0–36.0)
MCV: 92.5 fL (ref 80.0–100.0)
Monocytes Absolute: 0.2 10*3/uL (ref 0.2–1.0)
Monocytes Relative: 4 %
NEUTROS ABS: 5 10*3/uL (ref 1.4–6.5)
Neutrophils Relative %: 81 %
PLATELETS: 170 10*3/uL (ref 150–440)
RBC: 3.48 MIL/uL — AB (ref 4.40–5.90)
RDW: 14.1 % (ref 11.5–14.5)
WBC: 6.2 10*3/uL (ref 3.8–10.6)

## 2016-02-13 LAB — BASIC METABOLIC PANEL
Anion gap: 8 (ref 5–15)
BUN: 12 mg/dL (ref 6–20)
CALCIUM: 9.2 mg/dL (ref 8.9–10.3)
CO2: 25 mmol/L (ref 22–32)
CREATININE: 0.54 mg/dL — AB (ref 0.61–1.24)
Chloride: 105 mmol/L (ref 101–111)
GFR calc Af Amer: >60 mL/min (ref >60)
GLUCOSE: 149 mg/dL — AB (ref 65–99)
Potassium: 4.1 mmol/L (ref 3.5–5.1)
SODIUM: 138 mmol/L (ref 135–145)

## 2016-02-13 MED ORDER — SODIUM CHLORIDE 0.9 % IV SOLN
Freq: Once | INTRAVENOUS | Status: AC
  Administered 2016-02-13: 10:00:00 via INTRAVENOUS
  Filled 2016-02-13: qty 1000

## 2016-02-13 MED ORDER — DOCETAXEL CHEMO INJECTION 160 MG/16ML
20.0000 mg/m2 | Freq: Once | INTRAVENOUS | Status: AC
  Administered 2016-02-13: 40 mg via INTRAVENOUS
  Filled 2016-02-13: qty 4

## 2016-02-13 MED ORDER — DENOSUMAB 120 MG/1.7ML ~~LOC~~ SOLN
120.0000 mg | Freq: Once | SUBCUTANEOUS | Status: AC
  Administered 2016-02-13: 120 mg via SUBCUTANEOUS
  Filled 2016-02-13: qty 1.7

## 2016-02-13 MED ORDER — SODIUM CHLORIDE 0.9 % IV SOLN
10.0000 mg | Freq: Once | INTRAVENOUS | Status: AC
  Administered 2016-02-13: 10 mg via INTRAVENOUS
  Filled 2016-02-13: qty 1

## 2016-02-13 MED ORDER — DIPHENHYDRAMINE HCL 50 MG/ML IJ SOLN
25.0000 mg | Freq: Once | INTRAMUSCULAR | Status: AC
  Administered 2016-02-13: 25 mg via INTRAVENOUS
  Filled 2016-02-13: qty 1

## 2016-02-14 ENCOUNTER — Ambulatory Visit: Payer: Self-pay

## 2016-02-14 ENCOUNTER — Inpatient Hospital Stay: Payer: Medicare Other

## 2016-02-14 DIAGNOSIS — C61 Malignant neoplasm of prostate: Secondary | ICD-10-CM

## 2016-02-14 DIAGNOSIS — C7951 Secondary malignant neoplasm of bone: Principal | ICD-10-CM

## 2016-02-14 MED ORDER — LEUPROLIDE ACETATE (3 MONTH) 22.5 MG IM KIT
22.5000 mg | PACK | Freq: Once | INTRAMUSCULAR | Status: AC
  Administered 2016-02-14: 22.5 mg via INTRAMUSCULAR
  Filled 2016-02-14: qty 22.5

## 2016-02-20 ENCOUNTER — Inpatient Hospital Stay: Payer: Medicare Other

## 2016-02-20 ENCOUNTER — Inpatient Hospital Stay (HOSPITAL_BASED_OUTPATIENT_CLINIC_OR_DEPARTMENT_OTHER): Payer: Medicare Other | Admitting: Internal Medicine

## 2016-02-20 VITALS — BP 118/68 | HR 80 | Temp 98.6°F | Resp 20 | Ht 72.0 in | Wt 174.0 lb

## 2016-02-20 DIAGNOSIS — R9721 Rising PSA following treatment for malignant neoplasm of prostate: Secondary | ICD-10-CM

## 2016-02-20 DIAGNOSIS — F17210 Nicotine dependence, cigarettes, uncomplicated: Secondary | ICD-10-CM

## 2016-02-20 DIAGNOSIS — C7951 Secondary malignant neoplasm of bone: Secondary | ICD-10-CM

## 2016-02-20 DIAGNOSIS — C61 Malignant neoplasm of prostate: Secondary | ICD-10-CM

## 2016-02-20 DIAGNOSIS — R011 Cardiac murmur, unspecified: Secondary | ICD-10-CM

## 2016-02-20 DIAGNOSIS — C779 Secondary and unspecified malignant neoplasm of lymph node, unspecified: Secondary | ICD-10-CM | POA: Diagnosis not present

## 2016-02-20 DIAGNOSIS — Z923 Personal history of irradiation: Secondary | ICD-10-CM

## 2016-02-20 DIAGNOSIS — Z79818 Long term (current) use of other agents affecting estrogen receptors and estrogen levels: Secondary | ICD-10-CM

## 2016-02-20 DIAGNOSIS — Z79899 Other long term (current) drug therapy: Secondary | ICD-10-CM

## 2016-02-20 DIAGNOSIS — E785 Hyperlipidemia, unspecified: Secondary | ICD-10-CM

## 2016-02-20 DIAGNOSIS — R112 Nausea with vomiting, unspecified: Secondary | ICD-10-CM

## 2016-02-20 DIAGNOSIS — R0602 Shortness of breath: Secondary | ICD-10-CM

## 2016-02-20 DIAGNOSIS — J45909 Unspecified asthma, uncomplicated: Secondary | ICD-10-CM

## 2016-02-20 LAB — CBC WITH DIFFERENTIAL/PLATELET
BASOS ABS: 0 10*3/uL (ref 0–0.1)
BASOS PCT: 0 %
Eosinophils Absolute: 0 10*3/uL (ref 0–0.7)
Eosinophils Relative: 0 %
HEMATOCRIT: 33.1 % — AB (ref 40.0–52.0)
Hemoglobin: 11.8 g/dL — ABNORMAL LOW (ref 13.0–18.0)
Lymphocytes Relative: 10 %
Lymphs Abs: 0.7 10*3/uL — ABNORMAL LOW (ref 1.0–3.6)
MCH: 32.9 pg (ref 26.0–34.0)
MCHC: 35.7 g/dL (ref 32.0–36.0)
MCV: 92 fL (ref 80.0–100.0)
MONO ABS: 0.3 10*3/uL (ref 0.2–1.0)
Monocytes Relative: 4 %
NEUTROS ABS: 5.9 10*3/uL (ref 1.4–6.5)
Neutrophils Relative %: 86 %
PLATELETS: 185 10*3/uL (ref 150–440)
RBC: 3.6 MIL/uL — AB (ref 4.40–5.90)
RDW: 14.4 % (ref 11.5–14.5)
WBC: 6.9 10*3/uL (ref 3.8–10.6)

## 2016-02-20 LAB — COMPREHENSIVE METABOLIC PANEL
ALBUMIN: 4.1 g/dL (ref 3.5–5.0)
ALT: 16 U/L — AB (ref 17–63)
AST: 29 U/L (ref 15–41)
Alkaline Phosphatase: 113 U/L (ref 38–126)
Anion gap: 9 (ref 5–15)
BILIRUBIN TOTAL: 0.7 mg/dL (ref 0.3–1.2)
BUN: 19 mg/dL (ref 6–20)
CHLORIDE: 103 mmol/L (ref 101–111)
CO2: 23 mmol/L (ref 22–32)
CREATININE: 0.6 mg/dL — AB (ref 0.61–1.24)
Calcium: 9.3 mg/dL (ref 8.9–10.3)
GFR calc Af Amer: >60 mL/min (ref >60)
GFR calc non Af Amer: >60 mL/min (ref >60)
GLUCOSE: 135 mg/dL — AB (ref 65–99)
POTASSIUM: 4.1 mmol/L (ref 3.5–5.1)
Sodium: 135 mmol/L (ref 135–145)
Total Protein: 7.2 g/dL (ref 6.5–8.1)

## 2016-02-20 LAB — PSA: PSA: 756 ng/mL — ABNORMAL HIGH (ref 0.00–4.00)

## 2016-02-20 MED ORDER — DEXAMETHASONE SODIUM PHOSPHATE 10 MG/ML IJ SOLN
10.0000 mg | Freq: Once | INTRAMUSCULAR | Status: AC
  Administered 2016-02-20: 10 mg via INTRAVENOUS
  Filled 2016-02-20: qty 1

## 2016-02-20 MED ORDER — SODIUM CHLORIDE 0.9 % IV SOLN
Freq: Once | INTRAVENOUS | Status: AC
  Administered 2016-02-20: 11:00:00 via INTRAVENOUS
  Filled 2016-02-20: qty 1000

## 2016-02-20 MED ORDER — DIPHENHYDRAMINE HCL 50 MG/ML IJ SOLN
25.0000 mg | Freq: Once | INTRAMUSCULAR | Status: AC
  Administered 2016-02-20: 25 mg via INTRAVENOUS
  Filled 2016-02-20: qty 1

## 2016-02-20 MED ORDER — DOCETAXEL CHEMO INJECTION 160 MG/16ML
20.0000 mg/m2 | Freq: Once | INTRAVENOUS | Status: AC
  Administered 2016-02-20: 40 mg via INTRAVENOUS
  Filled 2016-02-20: qty 4

## 2016-02-20 MED ORDER — SODIUM CHLORIDE 0.9 % IV SOLN: 10.0000 mg | Freq: Once | INTRAVENOUS | Status: DC

## 2016-02-20 NOTE — Progress Notes (Signed)
[place] OFFICE PROGRESS NOTE  Patient Care Team: Lenard Simmer, MD as PCP - General (Endocrinology)  No matching staging information was found for the patient.   Oncology History   1. Carcinoma of prostate diagnosis in 2006.  Had received radiation therapy, external beam to prostate and pelvic lymph nodes.  Had Gleason 9 (4+5.)  PainBaseline PSA was 13. 2. Received Lupron injection after her radiation therapy also.  PSA was going up.  Last year or so received Lupron injection in September but did not have any followup.   3. Recently (this August, 2012) PSA was found to be 17. Patient was started on Lupron from September of 2012. 4. Patient has progressive disease by PSA criteria (October, 2013) 5. Patient was started on Zytiga and prednisone in November of 2013. 6.traumatic hip fracture ((right femur) status post internally fixed ,no evidence of metastatic disease(May of 2014) 7.bone scan in July of 2014 at outside institutions shows metastases to left scapula and left 11th rib/.. 8.progressive On ZYTIGA by PSA criteria.  9[name], September of 2015- Progression- Jan 2016  # AUG 11th- Provenge 2 of planned 3 [sec to logistical issues]  # SEP 2017- START DOCETAXEL weekly     Prostate cancer metastatic to bone ([place])   10/19/2015 Initial Diagnosis    Prostate cancer metastatic to bone [facility])        INTERVAL HISTORY:  Preston Wilson 80 y.o.  male pleasant patient above history of Metastatic prostate cancer status post multiple lines of therapy Currently on weekly docetaxel is here for follow-up.  Patient status post 4 treatments.   Denies any nausea vomiting. Denies any tingling or numbness. Denies any swelling of the legs. No skin rash. Continues to feel well overall. Denies any pain. Not taking any pain medications.  REVIEW OF SYSTEMS:  A complete 10 point review of system is done which is negative except mentioned above/history of present  illness.   PAST MEDICAL HISTORY :  Past Medical History:  Diagnosis Date  . Asthma   . Heart murmur   . Hyperlipidemia   . Hypocalcemia   . Osteomyelitis ([place])   . Prostate cancer ([place])   . SOB (shortness of breath)     PAST SURGICAL HISTORY :   Past Surgical History:  Procedure Laterality Date  . COLONOSCOPY WITH PROPOFOL N/A 04/12/2015   Procedure: COLONOSCOPY WITH PROPOFOL;  Surgeon: Lollie Sails, MD;  Location: [facility] ENDOSCOPY;  Service: Endoscopy;  Laterality: N/A;  . EXCISIONAL HEMORRHOIDECTOMY    . TONSILLECTOMY      FAMILY HISTORY :  No family history on file.  SOCIAL HISTORY:   Social History  Substance Use Topics  . Smoking status: Current Every Day Smoker    Packs/day: 0.50    Types: Cigarettes  . Smokeless tobacco: Never Used  . Alcohol use No    ALLERGIES:  has No Known Allergies.  MEDICATIONS:  Current Outpatient Prescriptions  Medication Sig Dispense Refill  . calcium-vitamin D (OSCAL WITH D) 500-200 MG-UNIT per tablet Take 1 tablet by mouth daily with breakfast.     . dexamethasone (DECADRON) 4 MG tablet Take 1 pill every 12 hours; start the day prior to chemo x 3 days. 60 tablet 0  . Leuprolide Acetate (LUPRON IJ) Inject 1 mL as directed every 3 (three) months.    . Multiple Vitamin (MULTIVITAMIN) tablet Take 1 tablet by mouth daily.    Marland Kitchen ADVAIR DISKUS 100-50 MCG/DOSE AEPB inhale 1 dose by  mouth twice a day  0  . ondansetron (ZOFRAN) 8 MG tablet 1 pill every 8 hours as needed for nausea/vomitting (Patient not taking: Reported on 02/20/2016) 40 tablet 1  . pravastatin (PRAVACHOL) 20 MG tablet Take 20 mg by mouth daily.    . prochlorperazine (COMPAZINE) 10 MG tablet Take 1 tablet (10 mg total) by mouth every 6 (six) hours as needed for nausea or vomiting. (Patient not taking: Reported on 02/20/2016) 40 tablet 1  . Vitamin D, Ergocalciferol, (DRISDOL) 50000 UNITS CAPS capsule Take 50,000 Units by mouth once a week. Fridays  0   No current  facility-administered medications for this visit.     PHYSICAL EXAMINATION: ECOG PERFORMANCE STATUS: 0 - Asymptomatic  BP 118/68 (BP Location: Left Arm, Patient Position: Sitting)   Pulse 80   Temp 98.6 F (37 C) (Tympanic)   Resp 20   Ht 6' (1.829 m)   Wt 174 lb (78.9 kg)   BMI 23.60 kg/m   Filed Weights   02/20/16 0939  Weight: 174 lb (78.9 kg)    GENERAL: Well-nourished well-developed; Alert, no distress and comfortable.   Accompanied by family. EYES: no pallor or icterus OROPHARYNX: no thrush or ulceration; good dentition  NECK: supple, no masses felt LYMPH:  no palpable lymphadenopathy in the cervical, axillary or inguinal regions LUNGS: clear to auscultation and  No wheeze or crackles HEART/CVS: regular rate & rhythm and no murmurs; No lower extremity edema ABDOMEN:abdomen soft, non-tender and normal bowel sounds Musculoskeletal:no cyanosis of digits and no clubbing  PSYCH: alert & oriented x 3 with fluent speech NEURO: no focal motor/sensory deficits SKIN:  no rashes or significant lesions  LABORATORY DATA:  I have reviewed the data as listed    Component Value Date/Time   NA 135 02/20/2016 0857   NA 138 08/25/2014 1442   K 4.1 02/20/2016 0857   K 4.1 08/25/2014 1442   CL 103 02/20/2016 0857   CL 106 08/25/2014 1442   CO2 23 02/20/2016 0857   CO2 25 08/25/2014 1442   GLUCOSE 135 (H) 02/20/2016 0857   GLUCOSE 138 (H) 08/25/2014 1442   BUN 19 02/20/2016 0857   BUN 19 08/25/2014 1442   CREATININE 0.60 (L) 02/20/2016 0857   CREATININE 0.53 (L) 08/25/2014 1442   CALCIUM 9.3 02/20/2016 0857   CALCIUM 9.0 08/25/2014 1442   PROT 7.2 02/20/2016 0857   PROT 6.8 08/25/2014 1442   ALBUMIN 4.1 02/20/2016 0857   ALBUMIN 3.9 08/25/2014 1442   AST 29 02/20/2016 0857   AST 18 08/25/2014 1442   ALT 16 (L) 02/20/2016 0857   ALT 13 (L) 08/25/2014 1442   ALKPHOS 113 02/20/2016 0857   ALKPHOS 73 08/25/2014 1442   BILITOT 0.7 02/20/2016 0857   BILITOT 0.7 08/25/2014  1442   GFRNONAA >60 02/20/2016 0857   GFRNONAA >60 08/25/2014 1442   GFRAA >60 02/20/2016 0857   GFRAA >60 08/25/2014 1442    No results found for: SPEP, UPEP  Lab Results  Component Value Date   WBC 6.9 02/20/2016   NEUTROABS 5.9 02/20/2016   HGB 11.8 (L) 02/20/2016   HCT 33.1 (L) 02/20/2016   MCV 92.0 02/20/2016   PLT 185 02/20/2016      Chemistry      Component Value Date/Time   NA 135 02/20/2016 0857   NA 138 08/25/2014 1442   K 4.1 02/20/2016 0857   K 4.1 08/25/2014 1442   CL 103 02/20/2016 0857   CL 106 08/25/2014 1442  CO2 23 02/20/2016 0857   CO2 25 08/25/2014 1442   BUN 19 02/20/2016 0857   BUN 19 08/25/2014 1442   CREATININE 0.60 (L) 02/20/2016 0857   CREATININE 0.53 (L) 08/25/2014 1442      Component Value Date/Time   CALCIUM 9.3 02/20/2016 0857   CALCIUM 9.0 08/25/2014 1442   ALKPHOS 113 02/20/2016 0857   ALKPHOS 73 08/25/2014 1442   AST 29 02/20/2016 0857   AST 18 08/25/2014 1442   ALT 16 (L) 02/20/2016 0857   ALT 13 (L) 08/25/2014 1442   BILITOT 0.7 02/20/2016 0857   BILITOT 0.7 08/25/2014 1442       RADIOGRAPHIC STUDIES: I have personally reviewed the radiological images as listed and agreed with the findings in the report. No results found.   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  May 2017 CT scan shows progressive lesions in the bone and no evidence of any visceral metastases.  PSA SEP 7th 238 [from 99 in July 2017]. Currently- on weekly docetaxel s/p 4 treatments.PSA -pending. Tolerating well.   # Continue weekly docetaxel; #5 today [alk phos-improving]. No major side effects. Labs are adequate.   # follow-up in approximately 2 weeks/CBC CMP/PSA;  weekly CBC BMP.   Orders Placed This Encounter  Procedures  . CBC with Differential    Standing Status:   Future    Standing Expiration Date:   02/19/2017  . Basic metabolic panel     Standing Status:   Future    Standing Expiration Date:   02/19/2017  . CBC with Differential    Standing Status:   Future    Standing Expiration Date:   02/19/2017  . Comprehensive metabolic panel    Standing Status:   Future    Standing Expiration Date:   02/19/2017     Cammie Sickle, MD 02/20/2016 1:21 PM

## 2016-02-20 NOTE — Assessment & Plan Note (Addendum)
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  May 2017 CT scan shows progressive lesions in the bone and no evidence of any visceral metastases.  PSA SEP 7th 238 [from 99 in July 2017]. Currently- on weekly docetaxel s/p 4 treatments.PSA -pending. Tolerating well.   # Continue weekly docetaxel; #5 today [alk phos-improving]. No major side effects. Labs are adequate.   # follow-up in approximately 2 weeks/CBC CMP/PSA;  weekly CBC BMP.

## 2016-02-21 ENCOUNTER — Telehealth: Payer: Self-pay | Admitting: *Deleted

## 2016-02-21 DIAGNOSIS — C7951 Secondary malignant neoplasm of bone: Principal | ICD-10-CM

## 2016-02-21 DIAGNOSIS — C61 Malignant neoplasm of prostate: Secondary | ICD-10-CM

## 2016-02-21 NOTE — Telephone Encounter (Signed)
-----   Message from Cammie Sickle, MD sent at 02/21/2016  8:14 AM EDT ----- Please inform patient/wife- the PSA is going up; however I should expect that to go down in the next few weeks; for now continue current therapy.  please PSA for 3 weeks from now. If wife has questions- I could talk to her.  Thx

## 2016-02-21 NOTE — Telephone Encounter (Signed)
Contacted patient. Spoke with wife. Results of psa provided to patient. I explained to her that Dr. Vernell Morgans dlike to proceed with current treatment plan and does not want to make any changes. PSA remains elevated and has increased since last blood drawn. She gave verbal understanding of the plan. However, stressed "anxiety over the levels going up. I'm just worried that the treatment is not working and we may need to change the plan." I reassured the patient's wife that the elevation of the levels does not necessarily mean that the treatment is not working. Dr. Rogue Bussing would like to continue with treatment regimen for now and will recheck the levels on 11/6.

## 2016-02-27 ENCOUNTER — Inpatient Hospital Stay: Payer: Medicare Other

## 2016-02-27 VITALS — BP 132/67 | HR 67 | Temp 96.5°F | Resp 18

## 2016-02-27 DIAGNOSIS — C61 Malignant neoplasm of prostate: Secondary | ICD-10-CM

## 2016-02-27 DIAGNOSIS — C7951 Secondary malignant neoplasm of bone: Principal | ICD-10-CM

## 2016-02-27 LAB — BASIC METABOLIC PANEL
Anion gap: 7 (ref 5–15)
BUN: 21 mg/dL — AB (ref 6–20)
CHLORIDE: 104 mmol/L (ref 101–111)
CO2: 25 mmol/L (ref 22–32)
CREATININE: 0.56 mg/dL — AB (ref 0.61–1.24)
Calcium: 9.4 mg/dL (ref 8.9–10.3)
GFR calc Af Amer: >60 mL/min (ref >60)
GFR calc non Af Amer: >60 mL/min (ref >60)
GLUCOSE: 116 mg/dL — AB (ref 65–99)
POTASSIUM: 4.6 mmol/L (ref 3.5–5.1)
Sodium: 136 mmol/L (ref 135–145)

## 2016-02-27 LAB — CBC WITH DIFFERENTIAL/PLATELET
Basophils Absolute: 0.1 10*3/uL (ref 0–0.1)
Basophils Relative: 1 %
Eosinophils Absolute: 0 10*3/uL (ref 0–0.7)
Eosinophils Relative: 0 %
HEMATOCRIT: 33.2 % — AB (ref 40.0–52.0)
HEMOGLOBIN: 11.5 g/dL — AB (ref 13.0–18.0)
LYMPHS ABS: 1 10*3/uL (ref 1.0–3.6)
LYMPHS PCT: 12 %
MCH: 32 pg (ref 26.0–34.0)
MCHC: 34.4 g/dL (ref 32.0–36.0)
MCV: 92.8 fL (ref 80.0–100.0)
MONOS PCT: 7 %
Monocytes Absolute: 0.6 10*3/uL (ref 0.2–1.0)
NEUTROS PCT: 80 %
Neutro Abs: 6.8 10*3/uL — ABNORMAL HIGH (ref 1.4–6.5)
Platelets: 186 10*3/uL (ref 150–440)
RBC: 3.58 MIL/uL — AB (ref 4.40–5.90)
RDW: 14.9 % — ABNORMAL HIGH (ref 11.5–14.5)
WBC: 8.4 10*3/uL (ref 3.8–10.6)

## 2016-02-27 MED ORDER — SODIUM CHLORIDE 0.9 % IV SOLN: 10.0000 mg | Freq: Once | INTRAVENOUS | Status: DC

## 2016-02-27 MED ORDER — DEXAMETHASONE SODIUM PHOSPHATE 10 MG/ML IJ SOLN
10.0000 mg | Freq: Once | INTRAMUSCULAR | Status: AC
  Administered 2016-02-27: 10 mg via INTRAVENOUS
  Filled 2016-02-27: qty 1

## 2016-02-27 MED ORDER — SODIUM CHLORIDE 0.9 % IV SOLN
Freq: Once | INTRAVENOUS | Status: AC
  Administered 2016-02-27: 11:00:00 via INTRAVENOUS
  Filled 2016-02-27: qty 1000

## 2016-02-27 MED ORDER — DIPHENHYDRAMINE HCL 50 MG/ML IJ SOLN
25.0000 mg | Freq: Once | INTRAMUSCULAR | Status: AC
  Administered 2016-02-27: 25 mg via INTRAVENOUS
  Filled 2016-02-27: qty 1

## 2016-02-27 MED ORDER — DEXTROSE 5 % IV SOLN
20.0000 mg/m2 | Freq: Once | INTRAVENOUS | Status: AC
  Administered 2016-02-27: 40 mg via INTRAVENOUS
  Filled 2016-02-27: qty 4

## 2016-03-05 ENCOUNTER — Inpatient Hospital Stay: Payer: Medicare Other

## 2016-03-05 ENCOUNTER — Encounter: Payer: Self-pay | Admitting: Internal Medicine

## 2016-03-05 ENCOUNTER — Inpatient Hospital Stay (HOSPITAL_BASED_OUTPATIENT_CLINIC_OR_DEPARTMENT_OTHER): Payer: Medicare Other | Admitting: Internal Medicine

## 2016-03-05 VITALS — BP 136/68 | HR 86 | Temp 97.1°F | Ht 72.0 in | Wt 173.6 lb

## 2016-03-05 VITALS — BP 150/66 | HR 74 | Temp 97.0°F | Resp 18

## 2016-03-05 DIAGNOSIS — F17210 Nicotine dependence, cigarettes, uncomplicated: Secondary | ICD-10-CM

## 2016-03-05 DIAGNOSIS — C7951 Secondary malignant neoplasm of bone: Principal | ICD-10-CM

## 2016-03-05 DIAGNOSIS — Z923 Personal history of irradiation: Secondary | ICD-10-CM

## 2016-03-05 DIAGNOSIS — E785 Hyperlipidemia, unspecified: Secondary | ICD-10-CM

## 2016-03-05 DIAGNOSIS — R9721 Rising PSA following treatment for malignant neoplasm of prostate: Secondary | ICD-10-CM

## 2016-03-05 DIAGNOSIS — R112 Nausea with vomiting, unspecified: Secondary | ICD-10-CM

## 2016-03-05 DIAGNOSIS — J45909 Unspecified asthma, uncomplicated: Secondary | ICD-10-CM

## 2016-03-05 DIAGNOSIS — R0602 Shortness of breath: Secondary | ICD-10-CM

## 2016-03-05 DIAGNOSIS — C61 Malignant neoplasm of prostate: Secondary | ICD-10-CM

## 2016-03-05 DIAGNOSIS — C779 Secondary and unspecified malignant neoplasm of lymph node, unspecified: Secondary | ICD-10-CM | POA: Diagnosis not present

## 2016-03-05 DIAGNOSIS — Z79818 Long term (current) use of other agents affecting estrogen receptors and estrogen levels: Secondary | ICD-10-CM

## 2016-03-05 DIAGNOSIS — Z79899 Other long term (current) drug therapy: Secondary | ICD-10-CM

## 2016-03-05 DIAGNOSIS — R011 Cardiac murmur, unspecified: Secondary | ICD-10-CM

## 2016-03-05 LAB — CBC WITH DIFFERENTIAL/PLATELET
Basophils Absolute: 0 10*3/uL (ref 0–0.1)
Basophils Relative: 0 %
EOS ABS: 0 10*3/uL (ref 0–0.7)
Eosinophils Relative: 0 %
HEMATOCRIT: 34 % — AB (ref 40.0–52.0)
HEMOGLOBIN: 11.6 g/dL — AB (ref 13.0–18.0)
LYMPHS ABS: 1.1 10*3/uL (ref 1.0–3.6)
LYMPHS PCT: 14 %
MCH: 31.7 pg (ref 26.0–34.0)
MCHC: 34.2 g/dL (ref 32.0–36.0)
MCV: 92.8 fL (ref 80.0–100.0)
MONOS PCT: 6 %
Monocytes Absolute: 0.5 10*3/uL (ref 0.2–1.0)
NEUTROS ABS: 6.4 10*3/uL (ref 1.4–6.5)
NEUTROS PCT: 80 %
Platelets: 178 10*3/uL (ref 150–440)
RBC: 3.67 MIL/uL — AB (ref 4.40–5.90)
RDW: 14.7 % — ABNORMAL HIGH (ref 11.5–14.5)
WBC: 8.1 10*3/uL (ref 3.8–10.6)

## 2016-03-05 LAB — COMPREHENSIVE METABOLIC PANEL
ALK PHOS: 111 U/L (ref 38–126)
ALT: 16 U/L — AB (ref 17–63)
AST: 22 U/L (ref 15–41)
Albumin: 4.3 g/dL (ref 3.5–5.0)
Anion gap: 11 (ref 5–15)
BILIRUBIN TOTAL: 0.7 mg/dL (ref 0.3–1.2)
BUN: 16 mg/dL (ref 6–20)
CALCIUM: 9.3 mg/dL (ref 8.9–10.3)
CO2: 24 mmol/L (ref 22–32)
CREATININE: 0.59 mg/dL — AB (ref 0.61–1.24)
Chloride: 99 mmol/L — ABNORMAL LOW (ref 101–111)
GFR calc non Af Amer: >60 mL/min (ref >60)
GLUCOSE: 110 mg/dL — AB (ref 65–99)
Potassium: 4.9 mmol/L (ref 3.5–5.1)
SODIUM: 134 mmol/L — AB (ref 135–145)
TOTAL PROTEIN: 7.1 g/dL (ref 6.5–8.1)

## 2016-03-05 MED ORDER — DEXTROSE 5 % IV SOLN: 20.0000 mg/m2 | Freq: Once | INTRAVENOUS | Status: DC

## 2016-03-05 MED ORDER — DEXAMETHASONE SODIUM PHOSPHATE 10 MG/ML IJ SOLN
10.0000 mg | Freq: Once | INTRAMUSCULAR | Status: AC
  Administered 2016-03-05: 10 mg via INTRAVENOUS

## 2016-03-05 MED ORDER — SODIUM CHLORIDE 0.9 % IV SOLN: 10.0000 mg | Freq: Once | INTRAVENOUS | Status: DC

## 2016-03-05 MED ORDER — SODIUM CHLORIDE 0.9 % IV SOLN
20.0000 mg/m2 | Freq: Once | INTRAVENOUS | Status: AC
  Administered 2016-03-05: 40 mg via INTRAVENOUS
  Filled 2016-03-05: qty 4

## 2016-03-05 MED ORDER — SODIUM CHLORIDE 0.9 % IV SOLN
Freq: Once | INTRAVENOUS | Status: AC
  Administered 2016-03-05: 11:00:00 via INTRAVENOUS
  Filled 2016-03-05: qty 1000

## 2016-03-05 MED ORDER — DIPHENHYDRAMINE HCL 50 MG/ML IJ SOLN
25.0000 mg | Freq: Once | INTRAMUSCULAR | Status: AC
  Administered 2016-03-05: 25 mg via INTRAVENOUS
  Filled 2016-03-05: qty 1

## 2016-03-05 MED ORDER — DEXAMETHASONE SODIUM PHOSPHATE 10 MG/ML IJ SOLN
10.0000 mg | Freq: Once | INTRAMUSCULAR | Status: AC
  Administered 2016-03-05: 10 mg via INTRAVENOUS
  Filled 2016-03-05: qty 1

## 2016-03-05 NOTE — Progress Notes (Signed)
[place] OFFICE PROGRESS NOTE  Patient Care Team: Lenard Simmer, MD as PCP - General (Endocrinology)  No matching staging information was found for the patient.   Oncology History   1. Carcinoma of prostate diagnosis in 2006.  Had received radiation therapy, external beam to prostate and pelvic lymph nodes.  Had Gleason 9 (4+5.)  PainBaseline PSA was 13. 2. Received Lupron injection after her radiation therapy also.  PSA was going up.  Last year or so received Lupron injection in September but did not have any followup.   3. Recently (this August, 2012) PSA was found to be 17. Patient was started on Lupron from September of 2012. 4. Patient has progressive disease by PSA criteria (October, 2013) 5. Patient was started on Zytiga and prednisone in November of 2013. 6.traumatic hip fracture ((right femur) status post internally fixed ,no evidence of metastatic disease(May of 2014) 7.bone scan in July of 2014 at outside institutions shows metastases to left scapula and left 11th rib/.. 8.progressive On ZYTIGA by PSA criteria.  9[name], September of 2015- Progression- Jan 2016  # AUG 11th- Provenge 2 of planned 3 [sec to logistical issues]  # SEP 2017- START DOCETAXEL weekly     Prostate cancer metastatic to bone ([place])   10/19/2015 Initial Diagnosis    Prostate cancer metastatic to bone [facility])        INTERVAL HISTORY:  Preston Wilson 80 y.o.  male pleasant patient above history of Metastatic prostate cancer status post multiple lines of therapy Currently on weekly docetaxel is here for follow-up. Patient is currently status post 6 treatments   Denies any nausea vomiting. Denies any tingling or numbness. Denies any swelling of the legs. No skin rash. Continues to feel well overall. Denies any pain. Not taking any pain medications. He and his wife concerned about rising PSA.  REVIEW OF SYSTEMS:  A complete 10 point review of system is done which is  negative except mentioned above/history of present illness.   PAST MEDICAL HISTORY :  Past Medical History:  Diagnosis Date  . Asthma   . Heart murmur   . Hyperlipidemia   . Hypocalcemia   . Osteomyelitis ([place])   . Prostate cancer ([place])   . SOB (shortness of breath)     PAST SURGICAL HISTORY :   Past Surgical History:  Procedure Laterality Date  . COLONOSCOPY WITH PROPOFOL N/A 04/12/2015   Procedure: COLONOSCOPY WITH PROPOFOL;  Surgeon: Lollie Sails, MD;  Location: [facility] ENDOSCOPY;  Service: Endoscopy;  Laterality: N/A;  . EXCISIONAL HEMORRHOIDECTOMY    . TONSILLECTOMY      FAMILY HISTORY :  History reviewed. No pertinent family history.  SOCIAL HISTORY:   Social History  Substance Use Topics  . Smoking status: Current Every Day Smoker    Packs/day: 0.50    Types: Cigarettes  . Smokeless tobacco: Never Used  . Alcohol use No    ALLERGIES:  has No Known Allergies.  MEDICATIONS:  Current Outpatient Prescriptions  Medication Sig Dispense Refill  . ADVAIR DISKUS 100-50 MCG/DOSE AEPB inhale 1 dose by mouth twice a day  0  . calcium-vitamin D (OSCAL WITH D) 500-200 MG-UNIT per tablet Take 1 tablet by mouth daily with breakfast.     . dexamethasone (DECADRON) 4 MG tablet Take 1 pill every 12 hours; start the day prior to chemo x 3 days. 60 tablet 0  . Leuprolide Acetate (LUPRON IJ) Inject 1 mL as directed every 3 (three) months.    Marland Kitchen  Multiple Vitamin (MULTIVITAMIN) tablet Take 1 tablet by mouth daily.    . ondansetron (ZOFRAN) 8 MG tablet 1 pill every 8 hours as needed for nausea/vomitting 40 tablet 1  . prochlorperazine (COMPAZINE) 10 MG tablet Take 1 tablet (10 mg total) by mouth every 6 (six) hours as needed for nausea or vomiting. 40 tablet 1  . Vitamin D, Ergocalciferol, (DRISDOL) 50000 UNITS CAPS capsule Take 50,000 Units by mouth once a week. Fridays  0  . pravastatin (PRAVACHOL) 20 MG tablet Take 20 mg by mouth daily.     No current facility-administered  medications for this visit.     PHYSICAL EXAMINATION: ECOG PERFORMANCE STATUS: 0 - Asymptomatic  BP 136/68 (BP Location: Left Arm, Patient Position: Sitting)   Pulse 86   Temp 97.1 F (36.2 C) (Tympanic)   Ht 6' (1.829 m)   Wt 173 lb 9.6 oz (78.7 kg)   BMI 23.54 kg/m   Filed Weights   03/05/16 0942  Weight: 173 lb 9.6 oz (78.7 kg)    GENERAL: Well-nourished well-developed; Alert, no distress and comfortable.   Accompanied by family. EYES: no pallor or icterus OROPHARYNX: no thrush or ulceration; good dentition  NECK: supple, no masses felt LYMPH:  no palpable lymphadenopathy in the cervical, axillary or inguinal regions LUNGS: clear to auscultation and  No wheeze or crackles HEART/CVS: regular rate & rhythm and no murmurs; No lower extremity edema ABDOMEN:abdomen soft, non-tender and normal bowel sounds Musculoskeletal:no cyanosis of digits and no clubbing  PSYCH: alert & oriented x 3 with fluent speech NEURO: no focal motor/sensory deficits SKIN:  no rashes or significant lesions  LABORATORY DATA:  I have reviewed the data as listed    Component Value Date/Time   NA 134 (L) 03/05/2016 0920   NA 138 08/25/2014 1442   K 4.9 03/05/2016 0920   K 4.1 08/25/2014 1442   CL 99 (L) 03/05/2016 0920   CL 106 08/25/2014 1442   CO2 24 03/05/2016 0920   CO2 25 08/25/2014 1442   GLUCOSE 110 (H) 03/05/2016 0920   GLUCOSE 138 (H) 08/25/2014 1442   BUN 16 03/05/2016 0920   BUN 19 08/25/2014 1442   CREATININE 0.59 (L) 03/05/2016 0920   CREATININE 0.53 (L) 08/25/2014 1442   CALCIUM 9.3 03/05/2016 0920   CALCIUM 9.0 08/25/2014 1442   PROT 7.1 03/05/2016 0920   PROT 6.8 08/25/2014 1442   ALBUMIN 4.3 03/05/2016 0920   ALBUMIN 3.9 08/25/2014 1442   AST 22 03/05/2016 0920   AST 18 08/25/2014 1442   ALT 16 (L) 03/05/2016 0920   ALT 13 (L) 08/25/2014 1442   ALKPHOS 111 03/05/2016 0920   ALKPHOS 73 08/25/2014 1442   BILITOT 0.7 03/05/2016 0920   BILITOT 0.7 08/25/2014 1442    GFRNONAA >60 03/05/2016 0920   GFRNONAA >60 08/25/2014 1442   GFRAA >60 03/05/2016 0920   GFRAA >60 08/25/2014 1442    No results found for: SPEP, UPEP  Lab Results  Component Value Date   WBC 8.1 03/05/2016   NEUTROABS 6.4 03/05/2016   HGB 11.6 (L) 03/05/2016   HCT 34.0 (L) 03/05/2016   MCV 92.8 03/05/2016   PLT 178 03/05/2016      Chemistry      Component Value Date/Time   NA 134 (L) 03/05/2016 0920   NA 138 08/25/2014 1442   K 4.9 03/05/2016 0920   K 4.1 08/25/2014 1442   CL 99 (L) 03/05/2016 0920   CL 106 08/25/2014 1442   CO2  24 03/05/2016 0920   CO2 25 08/25/2014 1442   BUN 16 03/05/2016 0920   BUN 19 08/25/2014 1442   CREATININE 0.59 (L) 03/05/2016 0920   CREATININE 0.53 (L) 08/25/2014 1442      Component Value Date/Time   CALCIUM 9.3 03/05/2016 0920   CALCIUM 9.0 08/25/2014 1442   ALKPHOS 111 03/05/2016 0920   ALKPHOS 73 08/25/2014 1442   AST 22 03/05/2016 0920   AST 18 08/25/2014 1442   ALT 16 (L) 03/05/2016 0920   ALT 13 (L) 08/25/2014 1442   BILITOT 0.7 03/05/2016 0920   BILITOT 0.7 08/25/2014 1442     Results for HALIM, SURRETTE (MRN [id number]) as of 03/05/2016 10:48  Ref. Range 11/17/2015 13:35 12/12/2015 11:21 01/12/2016 13:00 01/20/2016 08:21 02/20/2016 08:57  PSA Latest Ref Range: 0.00 - 4.00 ng/mL 64.75 (H) 99.80 (H) 268.00 (H) 296.00 (H) 756.00 (H)    RADIOGRAPHIC STUDIES: I have personally reviewed the radiological images as listed and agreed with the findings in the report. No results found.   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  May 2017 CT scan shows progressive lesions in the bone and no evidence of any visceral metastases.  PSA SEP 7th 238 [from 99 in July 2017]. Currently- on weekly docetaxel s/p 6 treatments.PSA 2 weeks ago 756/rising. Otherwise  Tolerating well.   # discussed re: TM flare; will recheck CT/bone scan  after 3 months of treatments.   # Continue weekly docetaxel; #7 today [alk phos-improving]. No major side effects. Labs are adequate.  # Last Lupron in October 1 st week; next due in Jan 2018.    # follow-up in approximately 2 weeks/CBC CMP/X-geva. ;  weekly CBC BMP/PSA.   Orders Placed This Encounter  Procedures  . Comprehensive metabolic panel    Standing Status:   Standing    Number of Occurrences:   16    Standing Expiration Date:   03/05/2017     Cammie Sickle, MD 03/06/2016 8:43 AM

## 2016-03-05 NOTE — Assessment & Plan Note (Addendum)
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  May 2017 CT scan shows progressive lesions in the bone and no evidence of any visceral metastases.  PSA SEP 7th 238 [from 99 in July 2017]. Currently- on weekly docetaxel s/p 6 treatments.PSA 2 weeks ago 756/rising. Otherwise  Tolerating well.   # discussed re: TM flare; will recheck CT/bone scan after 3 months of treatments.   # Continue weekly docetaxel; #7 today [alk phos-improving]. No major side effects. Labs are adequate.  # Last Lupron in October 1 st week; next due in Jan 2018.    # follow-up in approximately 2 weeks/CBC CMP/X-geva. ;  weekly CBC BMP/PSA.

## 2016-03-05 NOTE — Progress Notes (Signed)
Patient here for pre treatment check. States no changes since last appointment.

## 2016-03-12 ENCOUNTER — Inpatient Hospital Stay: Payer: Medicare Other

## 2016-03-12 ENCOUNTER — Other Ambulatory Visit: Payer: Self-pay | Admitting: Internal Medicine

## 2016-03-12 ENCOUNTER — Inpatient Hospital Stay: Payer: Medicare Other | Attending: Internal Medicine

## 2016-03-12 VITALS — BP 138/68 | HR 76 | Temp 97.1°F | Resp 18

## 2016-03-12 DIAGNOSIS — Z5111 Encounter for antineoplastic chemotherapy: Secondary | ICD-10-CM | POA: Diagnosis not present

## 2016-03-12 DIAGNOSIS — Z7952 Long term (current) use of systemic steroids: Secondary | ICD-10-CM | POA: Insufficient documentation

## 2016-03-12 DIAGNOSIS — J45909 Unspecified asthma, uncomplicated: Secondary | ICD-10-CM | POA: Insufficient documentation

## 2016-03-12 DIAGNOSIS — C7951 Secondary malignant neoplasm of bone: Secondary | ICD-10-CM | POA: Insufficient documentation

## 2016-03-12 DIAGNOSIS — F17210 Nicotine dependence, cigarettes, uncomplicated: Secondary | ICD-10-CM | POA: Insufficient documentation

## 2016-03-12 DIAGNOSIS — R0602 Shortness of breath: Secondary | ICD-10-CM | POA: Insufficient documentation

## 2016-03-12 DIAGNOSIS — Z79899 Other long term (current) drug therapy: Secondary | ICD-10-CM | POA: Insufficient documentation

## 2016-03-12 DIAGNOSIS — R011 Cardiac murmur, unspecified: Secondary | ICD-10-CM | POA: Insufficient documentation

## 2016-03-12 DIAGNOSIS — E785 Hyperlipidemia, unspecified: Secondary | ICD-10-CM | POA: Insufficient documentation

## 2016-03-12 DIAGNOSIS — Z7689 Persons encountering health services in other specified circumstances: Secondary | ICD-10-CM | POA: Diagnosis not present

## 2016-03-12 DIAGNOSIS — C61 Malignant neoplasm of prostate: Secondary | ICD-10-CM

## 2016-03-12 DIAGNOSIS — Z8781 Personal history of (healed) traumatic fracture: Secondary | ICD-10-CM | POA: Insufficient documentation

## 2016-03-12 DIAGNOSIS — Z79818 Long term (current) use of other agents affecting estrogen receptors and estrogen levels: Secondary | ICD-10-CM | POA: Insufficient documentation

## 2016-03-12 LAB — CBC WITH DIFFERENTIAL/PLATELET
BASOS PCT: 0 %
Basophils Absolute: 0 10*3/uL (ref 0–0.1)
Eosinophils Absolute: 0 10*3/uL (ref 0–0.7)
Eosinophils Relative: 0 %
HEMATOCRIT: 34.2 % — AB (ref 40.0–52.0)
HEMOGLOBIN: 11.9 g/dL — AB (ref 13.0–18.0)
LYMPHS PCT: 13 %
Lymphs Abs: 0.8 10*3/uL — ABNORMAL LOW (ref 1.0–3.6)
MCH: 32.5 pg (ref 26.0–34.0)
MCHC: 34.9 g/dL (ref 32.0–36.0)
MCV: 93 fL (ref 80.0–100.0)
MONO ABS: 0.3 10*3/uL (ref 0.2–1.0)
MONOS PCT: 4 %
NEUTROS ABS: 5.2 10*3/uL (ref 1.4–6.5)
NEUTROS PCT: 83 %
Platelets: 164 10*3/uL (ref 150–440)
RBC: 3.68 MIL/uL — ABNORMAL LOW (ref 4.40–5.90)
RDW: 15.6 % — AB (ref 11.5–14.5)
WBC: 6.3 10*3/uL (ref 3.8–10.6)

## 2016-03-12 LAB — COMPREHENSIVE METABOLIC PANEL
ALK PHOS: 120 U/L (ref 38–126)
ALT: 18 U/L (ref 17–63)
ANION GAP: 10 (ref 5–15)
AST: 31 U/L (ref 15–41)
Albumin: 4.1 g/dL (ref 3.5–5.0)
BILIRUBIN TOTAL: 0.6 mg/dL (ref 0.3–1.2)
BUN: 14 mg/dL (ref 6–20)
CALCIUM: 9.5 mg/dL (ref 8.9–10.3)
CO2: 25 mmol/L (ref 22–32)
CREATININE: 0.63 mg/dL (ref 0.61–1.24)
Chloride: 103 mmol/L (ref 101–111)
Glucose, Bld: 121 mg/dL — ABNORMAL HIGH (ref 65–99)
Potassium: 4.7 mmol/L (ref 3.5–5.1)
Sodium: 138 mmol/L (ref 135–145)
TOTAL PROTEIN: 7.3 g/dL (ref 6.5–8.1)

## 2016-03-12 LAB — PSA: PSA: 1325 ng/mL — ABNORMAL HIGH (ref 0.00–4.00)

## 2016-03-12 MED ORDER — DEXAMETHASONE SODIUM PHOSPHATE 10 MG/ML IJ SOLN
10.0000 mg | Freq: Once | INTRAMUSCULAR | Status: AC
  Administered 2016-03-12: 10 mg via INTRAVENOUS
  Filled 2016-03-12: qty 1

## 2016-03-12 MED ORDER — SODIUM CHLORIDE 0.9 % IV SOLN
Freq: Once | INTRAVENOUS | Status: AC
  Administered 2016-03-12: 11:00:00 via INTRAVENOUS
  Filled 2016-03-12: qty 1000

## 2016-03-12 MED ORDER — DIPHENHYDRAMINE HCL 50 MG/ML IJ SOLN
25.0000 mg | Freq: Once | INTRAMUSCULAR | Status: AC
  Administered 2016-03-12: 25 mg via INTRAVENOUS
  Filled 2016-03-12: qty 1

## 2016-03-12 MED ORDER — SODIUM CHLORIDE 0.9 % IV SOLN: 10.0000 mg | Freq: Once | INTRAVENOUS | Status: DC

## 2016-03-12 MED ORDER — SODIUM CHLORIDE 0.9 % IV SOLN
20.0000 mg/m2 | Freq: Once | INTRAVENOUS | Status: AC
  Administered 2016-03-12: 40 mg via INTRAVENOUS
  Filled 2016-03-12: qty 4

## 2016-03-13 ENCOUNTER — Telehealth: Payer: Self-pay | Admitting: Internal Medicine

## 2016-03-13 ENCOUNTER — Telehealth: Payer: Self-pay | Admitting: *Deleted

## 2016-03-13 DIAGNOSIS — C7951 Secondary malignant neoplasm of bone: Principal | ICD-10-CM

## 2016-03-13 DIAGNOSIS — C61 Malignant neoplasm of prostate: Secondary | ICD-10-CM

## 2016-03-13 NOTE — Telephone Encounter (Signed)
Left message to discuss the results re: rising PSA.   Please schedule CT C/A/P; bone scan ASAP. Follow up as planned.

## 2016-03-13 NOTE — Telephone Encounter (Signed)
Left message for patient's wife regarding the rising PSA. Recommend scans ASAP follow-up as planned.  Heather- please call pt's wife/ re: plan; schedule scans.

## 2016-03-13 NOTE — Telephone Encounter (Signed)
Requesting PSA results    Ref Range & Units 1d ago 3wk ago 58mo ago   PSA 0.00 - 4.00 ng/mL 1,325.00   756.00CM   296.00CM

## 2016-03-13 NOTE — Telephone Encounter (Signed)
Spoke to pt's wife- re: rising PSA; recommend scans asap. Possible cabazitaxel next week. Dr.B

## 2016-03-14 ENCOUNTER — Other Ambulatory Visit: Payer: Self-pay | Admitting: Internal Medicine

## 2016-03-14 NOTE — Progress Notes (Signed)
DISCONTINUE ON PATHWAY REGIMEN - Prostate  POS37: Docetaxel 75 mg/m2 q21 Days + Prednisone 5 mg BID Until Progression or Toxicity   A cycle is every 21 days:     Docetaxel (Taxotere(R)) 75 mg/m2 in 250 mL NS IV over one hour Dose Mod: None     Prednisone 5 mg orally twice daily Dose Mod: None Additional Orders: Premedicate with dexamethasone 8 mg PO bid for three days beginning 1 day prior to therapy  **Always confirm dose/schedule in your pharmacy ordering system**    REASON: Disease Progression PRIOR TREATMENT: POS37: Docetaxel 75 mg/m2 q21 Days + Prednisone 5 mg BID Until Progression or Toxicity TREATMENT RESPONSE: Progressive Disease (PD)  START ON PATHWAY REGIMEN - Prostate  POS83: Cabazitaxel 20 mg/m2 q21 Days + Prednisone 10 mg Daily Until Progression   A cycle is every 21 days.:     Cabazitaxel (Jevtana(R)) 20 mg/m2 in 250 mL NS IV (non-PVC container) over one hour on day 1. Use in-line 0.22 micrometer filter. Final concentration 0.1 - 0.26 mg/mL. Dose Mod: None     Prednisone 10 mg orally daily throughout treatment with cabazitaxel Dose Mod: None Additional Orders: May consider administering prednisone as 5 mg PO BID.  **Always confirm dose/schedule in your pharmacy ordering system**    Patient Characteristics: Adenocarcinoma, Metastatic, Castration Resistant, Symptomatic, Prior Docetaxel/Docetaxel Ineligible AJCC T Stage: X AJCC Stage Grouping: IV Current radiographic evidence of distant metastasis? Yes PSA: X Gleason Primary: X Gleason Secondary: X Gleason Score: X AJCC M Stage: X AJCC N Stage: X Would you be surprised if this patient died  in the next year? I would be surprised if this patient died in the next year  Intent of Therapy: Non-Curative / Palliative Intent, Discussed with Patient

## 2016-03-14 NOTE — Progress Notes (Signed)
I switched therapies to cabazitaxel- with onpro; plan to start on nov 13th. Please inform scheduling. Dr.B

## 2016-03-16 ENCOUNTER — Encounter
Admission: RE | Admit: 2016-03-16 | Discharge: 2016-03-16 | Disposition: A | Discharge status: Home / Self Care | Payer: Medicare Other | Source: Ambulatory Visit | Attending: Internal Medicine | Admitting: Internal Medicine

## 2016-03-16 ENCOUNTER — Ambulatory Visit
Admission: RE | Admit: 2016-03-16 | Discharge: 2016-03-16 | Disposition: A | Discharge status: Home / Self Care | Payer: Medicare Other | Source: Ambulatory Visit | Attending: Internal Medicine | Admitting: Internal Medicine

## 2016-03-16 DIAGNOSIS — N281 Cyst of kidney, acquired: Secondary | ICD-10-CM | POA: Insufficient documentation

## 2016-03-16 DIAGNOSIS — I700 Atherosclerosis of aorta: Secondary | ICD-10-CM | POA: Insufficient documentation

## 2016-03-16 DIAGNOSIS — R918 Other nonspecific abnormal finding of lung field: Secondary | ICD-10-CM

## 2016-03-16 DIAGNOSIS — C7951 Secondary malignant neoplasm of bone: Principal | ICD-10-CM

## 2016-03-16 DIAGNOSIS — C61 Malignant neoplasm of prostate: Secondary | ICD-10-CM | POA: Insufficient documentation

## 2016-03-16 MED ORDER — TECHNETIUM TC 99M MEDRONATE IV KIT
25.0000 | PACK | Freq: Once | INTRAVENOUS | Status: AC | PRN
  Administered 2016-03-16: 23.5 via INTRAVENOUS

## 2016-03-16 MED ORDER — IOPAMIDOL (ISOVUE-300) INJECTION 61%
100.0000 mL | Freq: Once | INTRAVENOUS | Status: AC | PRN
  Administered 2016-03-16: 100 mL via INTRAVENOUS

## 2016-03-19 ENCOUNTER — Inpatient Hospital Stay: Payer: Medicare Other

## 2016-03-19 ENCOUNTER — Inpatient Hospital Stay (HOSPITAL_BASED_OUTPATIENT_CLINIC_OR_DEPARTMENT_OTHER): Payer: Medicare Other | Admitting: Internal Medicine

## 2016-03-19 VITALS — BP 131/68 | HR 67 | Temp 97.3°F | Resp 18 | Wt 175.0 lb

## 2016-03-19 DIAGNOSIS — Z79818 Long term (current) use of other agents affecting estrogen receptors and estrogen levels: Secondary | ICD-10-CM

## 2016-03-19 DIAGNOSIS — J45909 Unspecified asthma, uncomplicated: Secondary | ICD-10-CM

## 2016-03-19 DIAGNOSIS — E785 Hyperlipidemia, unspecified: Secondary | ICD-10-CM

## 2016-03-19 DIAGNOSIS — C61 Malignant neoplasm of prostate: Secondary | ICD-10-CM

## 2016-03-19 DIAGNOSIS — C7951 Secondary malignant neoplasm of bone: Principal | ICD-10-CM

## 2016-03-19 DIAGNOSIS — R0602 Shortness of breath: Secondary | ICD-10-CM

## 2016-03-19 DIAGNOSIS — Z7689 Persons encountering health services in other specified circumstances: Secondary | ICD-10-CM | POA: Diagnosis not present

## 2016-03-19 DIAGNOSIS — Z7952 Long term (current) use of systemic steroids: Secondary | ICD-10-CM

## 2016-03-19 DIAGNOSIS — R011 Cardiac murmur, unspecified: Secondary | ICD-10-CM

## 2016-03-19 DIAGNOSIS — Z79899 Other long term (current) drug therapy: Secondary | ICD-10-CM

## 2016-03-19 DIAGNOSIS — F17210 Nicotine dependence, cigarettes, uncomplicated: Secondary | ICD-10-CM

## 2016-03-19 DIAGNOSIS — Z8781 Personal history of (healed) traumatic fracture: Secondary | ICD-10-CM

## 2016-03-19 LAB — CBC WITH DIFFERENTIAL/PLATELET
BASOS ABS: 0 10*3/uL (ref 0–0.1)
Basophils Relative: 1 %
EOS ABS: 0 10*3/uL (ref 0–0.7)
Eosinophils Relative: 0 %
HCT: 31.3 % — ABNORMAL LOW (ref 40.0–52.0)
HEMOGLOBIN: 10.8 g/dL — AB (ref 13.0–18.0)
LYMPHS ABS: 0.9 10*3/uL — AB (ref 1.0–3.6)
LYMPHS PCT: 14 %
MCH: 31.9 pg (ref 26.0–34.0)
MCHC: 34.4 g/dL (ref 32.0–36.0)
MCV: 92.6 fL (ref 80.0–100.0)
Monocytes Absolute: 0.4 10*3/uL (ref 0.2–1.0)
Monocytes Relative: 6 %
NEUTROS PCT: 79 %
Neutro Abs: 4.7 10*3/uL (ref 1.4–6.5)
Platelets: 168 10*3/uL (ref 150–440)
RBC: 3.38 MIL/uL — AB (ref 4.40–5.90)
RDW: 15.6 % — ABNORMAL HIGH (ref 11.5–14.5)
WBC: 6 10*3/uL (ref 3.8–10.6)

## 2016-03-19 LAB — COMPREHENSIVE METABOLIC PANEL
ALK PHOS: 109 U/L (ref 38–126)
ALT: 17 U/L (ref 17–63)
AST: 25 U/L (ref 15–41)
Albumin: 4 g/dL (ref 3.5–5.0)
Anion gap: 8 (ref 5–15)
BUN: 16 mg/dL (ref 6–20)
CALCIUM: 9 mg/dL (ref 8.9–10.3)
CO2: 25 mmol/L (ref 22–32)
CREATININE: 0.67 mg/dL (ref 0.61–1.24)
Chloride: 100 mmol/L — ABNORMAL LOW (ref 101–111)
GFR calc non Af Amer: >60 mL/min (ref >60)
Glucose, Bld: 130 mg/dL — ABNORMAL HIGH (ref 65–99)
Potassium: 4.3 mmol/L (ref 3.5–5.1)
SODIUM: 133 mmol/L — AB (ref 135–145)
Total Bilirubin: 0.4 mg/dL (ref 0.3–1.2)
Total Protein: 7 g/dL (ref 6.5–8.1)

## 2016-03-19 MED ORDER — DIPHENHYDRAMINE HCL 50 MG/ML IJ SOLN
25.0000 mg | Freq: Once | INTRAMUSCULAR | Status: AC
  Administered 2016-03-19: 25 mg via INTRAVENOUS
  Filled 2016-03-19: qty 1

## 2016-03-19 MED ORDER — PREDNISONE 5 MG PO TABS: 5.0000 mg | ORAL_TABLET | Freq: Two times a day (BID) | ORAL | 3 refills | Status: DC

## 2016-03-19 MED ORDER — DIPHENOXYLATE-ATROPINE 2.5-0.025 MG PO TABS: 1.0000 | ORAL_TABLET | Freq: Four times a day (QID) | ORAL | 0 refills | Status: DC | PRN

## 2016-03-19 MED ORDER — SODIUM CHLORIDE 0.9 % IV SOLN
20.0000 mg/m2 | Freq: Once | INTRAVENOUS | Status: AC
  Administered 2016-03-19: 40 mg via INTRAVENOUS
  Filled 2016-03-19: qty 4

## 2016-03-19 MED ORDER — SODIUM CHLORIDE 0.9 % IV SOLN
Freq: Once | INTRAVENOUS | Status: AC
  Administered 2016-03-19: 12:00:00 via INTRAVENOUS
  Filled 2016-03-19: qty 1000

## 2016-03-19 MED ORDER — SODIUM CHLORIDE 0.9 % IV SOLN: 10.0000 mg | Freq: Once | INTRAVENOUS | Status: DC

## 2016-03-19 MED ORDER — DEXTROSE 5 % IV SOLN: 20.0000 mg/m2 | Freq: Once | INTRAVENOUS | Status: DC

## 2016-03-19 MED ORDER — FAMOTIDINE IN NACL 20-0.9 MG/50ML-% IV SOLN
20.0000 mg | Freq: Once | INTRAVENOUS | Status: AC
  Administered 2016-03-19: 20 mg via INTRAVENOUS
  Filled 2016-03-19: qty 50

## 2016-03-19 MED ORDER — DEXAMETHASONE SODIUM PHOSPHATE 10 MG/ML IJ SOLN
10.0000 mg | Freq: Once | INTRAMUSCULAR | Status: AC
  Administered 2016-03-19: 10 mg via INTRAVENOUS
  Filled 2016-03-19: qty 1

## 2016-03-19 MED ORDER — PEGFILGRASTIM 6 MG/0.6ML ~~LOC~~ PSKT
6.0000 mg | PREFILLED_SYRINGE | Freq: Once | SUBCUTANEOUS | Status: AC
  Administered 2016-03-19: 6 mg via SUBCUTANEOUS
  Filled 2016-03-19: qty 0.6

## 2016-03-19 NOTE — Progress Notes (Signed)
[place] OFFICE PROGRESS NOTE  Patient Care Team: Lenard Simmer, MD as PCP - General (Endocrinology)  No matching staging information was found for the patient.   Oncology History   1. Carcinoma of prostate diagnosis in 2006.  Had received radiation therapy, external beam to prostate and pelvic lymph nodes.  Had Gleason 9 (4+5.)  PainBaseline PSA was 13. 2. Received Lupron injection after her radiation therapy also.  PSA was going up.  Last year or so received Lupron injection in September but did not have any followup.   3. Recently (this August, 2012) PSA was found to be 17. Patient was started on Lupron from September of 2012. 4. Patient has progressive disease by PSA criteria (October, 2013) 5. Patient was started on Zytiga and prednisone in November of 2013. 6.traumatic hip fracture ((right femur) status post internally fixed ,no evidence of metastatic disease(May of 2014) 7.bone scan in July of 2014 at outside institutions shows metastases to left scapula and left 11th rib/.. 8.progressive On ZYTIGA by PSA criteria.  9[name], September of 2015- Progression- Jan 2016  # AUG 11th- Provenge 2 of planned 3 [sec to logistical issues]  # SEP 2017- START DOCETAXEL weekly     Prostate cancer metastatic to bone ([place])   10/19/2015 Initial Diagnosis    Prostate cancer metastatic to bone [facility])        INTERVAL HISTORY:  Preston Wilson 80 y.o.  male pleasant patient above history of Metastatic prostate cancer status post multiple lines of therapy Currently on weekly docetaxel is here for follow-up. Patient is currently status post 8 treatments/ Review the results of his restaging CAT scan/bone scan.  Patient continues to deny any swelling in the legs or tingling or numbness. Denies any significant pain.  Denies any nausea vomiting.  Denies any swelling of the legs. No skin rash. Continues to feel well overall.  He and his wife concerned about rising  PSA.  REVIEW OF SYSTEMS:  A complete 10 point review of system is done which is negative except mentioned above/history of present illness.   PAST MEDICAL HISTORY :  Past Medical History:  Diagnosis Date  . Asthma   . Heart murmur   . Hyperlipidemia   . Hypocalcemia   . Osteomyelitis ([place])   . Prostate cancer ([place])   . SOB (shortness of breath)     PAST SURGICAL HISTORY :   Past Surgical History:  Procedure Laterality Date  . COLONOSCOPY WITH PROPOFOL N/A 04/12/2015   Procedure: COLONOSCOPY WITH PROPOFOL;  Surgeon: Lollie Sails, MD;  Location: [facility] ENDOSCOPY;  Service: Endoscopy;  Laterality: N/A;  . EXCISIONAL HEMORRHOIDECTOMY    . TONSILLECTOMY      FAMILY HISTORY :  No family history on file.  SOCIAL HISTORY:   Social History  Substance Use Topics  . Smoking status: Current Every Day Smoker    Packs/day: 0.50    Types: Cigarettes  . Smokeless tobacco: Never Used  . Alcohol use No    ALLERGIES:  has No Known Allergies.  MEDICATIONS:  Current Outpatient Prescriptions  Medication Sig Dispense Refill  . ADVAIR DISKUS 100-50 MCG/DOSE AEPB inhale 1 dose by mouth twice a day  0  . calcium-vitamin D (OSCAL WITH D) 500-200 MG-UNIT per tablet Take 1 tablet by mouth daily with breakfast.     . dexamethasone (DECADRON) 4 MG tablet Take 1 pill every 12 hours; start the day prior to chemo x 3 days. 60 tablet 0  .  Leuprolide Acetate (LUPRON IJ) Inject 1 mL as directed every 3 (three) months.    . Multiple Vitamin (MULTIVITAMIN) tablet Take 1 tablet by mouth daily.    . ondansetron (ZOFRAN) 8 MG tablet 1 pill every 8 hours as needed for nausea/vomitting 40 tablet 1  . pravastatin (PRAVACHOL) 20 MG tablet Take 20 mg by mouth daily.    . prochlorperazine (COMPAZINE) 10 MG tablet Take 1 tablet (10 mg total) by mouth every 6 (six) hours as needed for nausea or vomiting. 40 tablet 1  . Vitamin D, Ergocalciferol, (DRISDOL) 50000 UNITS CAPS capsule Take 50,000 Units by mouth once a  week. Fridays  0  . diphenoxylate-atropine (LOMOTIL) 2.5-0.025 MG tablet Take 1 tablet by mouth 4 (four) times daily as needed for diarrhea or loose stools. Take it along with immodium 40 tablet 0  . predniSONE (DELTASONE) 5 MG tablet Take 1 tablet (5 mg total) by mouth 2 (two) times daily with a meal. 60 tablet 3   No current facility-administered medications for this visit.     PHYSICAL EXAMINATION: ECOG PERFORMANCE STATUS: 0 - Asymptomatic  BP 131/68 (BP Location: Left Arm, Patient Position: Sitting)   Pulse 67   Temp 97.3 F (36.3 C) (Tympanic)   Resp 18   Wt 175 lb (79.4 kg)   SpO2 98%   BMI 23.73 kg/m   Filed Weights   03/19/16 1057  Weight: 175 lb (79.4 kg)    GENERAL: Well-nourished well-developed; Alert, no distress and comfortable.   Accompanied by family. EYES: no pallor or icterus OROPHARYNX: no thrush or ulceration; good dentition  NECK: supple, no masses felt LYMPH:  no palpable lymphadenopathy in the cervical, axillary or inguinal regions LUNGS: clear to auscultation and  No wheeze or crackles HEART/CVS: regular rate & rhythm and no murmurs; No lower extremity edema ABDOMEN:abdomen soft, non-tender and normal bowel sounds Musculoskeletal:no cyanosis of digits and no clubbing  PSYCH: alert & oriented x 3 with fluent speech NEURO: no focal motor/sensory deficits SKIN:  no rashes or significant lesions  LABORATORY DATA:  I have reviewed the data as listed    Component Value Date/Time   NA 133 (L) 03/19/2016 0940   NA 138 08/25/2014 1442   K 4.3 03/19/2016 0940   K 4.1 08/25/2014 1442   CL 100 (L) 03/19/2016 0940   CL 106 08/25/2014 1442   CO2 25 03/19/2016 0940   CO2 25 08/25/2014 1442   GLUCOSE 130 (H) 03/19/2016 0940   GLUCOSE 138 (H) 08/25/2014 1442   BUN 16 03/19/2016 0940   BUN 19 08/25/2014 1442   CREATININE 0.67 03/19/2016 0940   CREATININE 0.53 (L) 08/25/2014 1442   CALCIUM 9.0 03/19/2016 0940   CALCIUM 9.0 08/25/2014 1442   PROT 7.0  03/19/2016 0940   PROT 6.8 08/25/2014 1442   ALBUMIN 4.0 03/19/2016 0940   ALBUMIN 3.9 08/25/2014 1442   AST 25 03/19/2016 0940   AST 18 08/25/2014 1442   ALT 17 03/19/2016 0940   ALT 13 (L) 08/25/2014 1442   ALKPHOS 109 03/19/2016 0940   ALKPHOS 73 08/25/2014 1442   BILITOT 0.4 03/19/2016 0940   BILITOT 0.7 08/25/2014 1442   GFRNONAA >60 03/19/2016 0940   GFRNONAA >60 08/25/2014 1442   GFRAA >60 03/19/2016 0940   GFRAA >60 08/25/2014 1442    No results found for: SPEP, UPEP  Lab Results  Component Value Date   WBC 6.0 03/19/2016   NEUTROABS 4.7 03/19/2016   HGB 10.8 (L) 03/19/2016  HCT 31.3 (L) 03/19/2016   MCV 92.6 03/19/2016   PLT 168 03/19/2016      Chemistry      Component Value Date/Time   NA 133 (L) 03/19/2016 0940   NA 138 08/25/2014 1442   K 4.3 03/19/2016 0940   K 4.1 08/25/2014 1442   CL 100 (L) 03/19/2016 0940   CL 106 08/25/2014 1442   CO2 25 03/19/2016 0940   CO2 25 08/25/2014 1442   BUN 16 03/19/2016 0940   BUN 19 08/25/2014 1442   CREATININE 0.67 03/19/2016 0940   CREATININE 0.53 (L) 08/25/2014 1442      Component Value Date/Time   CALCIUM 9.0 03/19/2016 0940   CALCIUM 9.0 08/25/2014 1442   ALKPHOS 109 03/19/2016 0940   ALKPHOS 73 08/25/2014 1442   AST 25 03/19/2016 0940   AST 18 08/25/2014 1442   ALT 17 03/19/2016 0940   ALT 13 (L) 08/25/2014 1442   BILITOT 0.4 03/19/2016 0940   BILITOT 0.7 08/25/2014 1442      Results for Preston Wilson, Preston Wilson (MRN [id number]) as of 03/19/2016 16:01  Ref. Range 12/12/2015 11:21 01/12/2016 13:00 01/20/2016 08:21 02/20/2016 08:57 03/12/2016 09:32  PSA Latest Ref Range: 0.00 - 4.00 ng/mL 99.80 (H) 268.00 (H) 296.00 (H) 756.00 (H) 1,325.00 (H)    RADIOGRAPHIC STUDIES: I have personally reviewed the radiological images as listed and agreed with the findings in the report. No results found.   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  May 2017 CT scan shows progressive lesions in the bone and no evidence of any visceral metastases. Weekly Taxotere x 8- progressive PSA; CT scan- progressive bone disease; Bone scan stable; PSA- 1000+.   # Proceed with Cabazitaxel every 3 weeks; discussed regarding diarrhea. Discussed regarding Imodium and Lomotil. Prescription given.  # Growth factor-Neulasta/On pro would be given as prophylaxis for chemotherapy-induced neutropenia to prevent febrile neutropenias. Discussed potential side effect- myalgias/arthralgias- recommend Claritin for 7 days.   # Last Lupron in October 1 st week; next due in Jan 2018.    # follow-up in approximately 2 weeks/CBC CMP/X-geva. weekly CBC BMP/PSA.   # I left message for Dr.Morayati to discuss above plan.   # I reviewed the blood work- with the patient in detail; also reviewed the imaging independently [as summarized above]; and with the patient in detail.   Orders Placed This Encounter  Procedures  . Basic metabolic panel    Standing Status:   Future    Standing Expiration Date:   03/19/2017     Cammie Sickle, MD 03/19/2016 4:02 PM

## 2016-03-19 NOTE — Assessment & Plan Note (Addendum)
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  May 2017 CT scan shows progressive lesions in the bone and no evidence of any visceral metastases. Weekly Taxotere x 8- progressive PSA; CT scan- progressive bone disease; Bone scan stable; PSA- 1000+.   # Proceed with Cabazitaxel every 3 weeks; discussed regarding diarrhea. Discussed regarding Imodium and Lomotil. Prescription given.  # Growth factor-Neulasta/On pro would be given as prophylaxis for chemotherapy-induced neutropenia to prevent febrile neutropenias. Discussed potential side effect- myalgias/arthralgias- recommend Claritin for 7 days.   # Last Lupron in October 1 st week; next due in Jan 2018.    # follow-up in approximately 2 weeks/CBC CMP/X-geva. weekly CBC BMP/PSA.   # I left message for Dr.Morayati to discuss above plan.   # I reviewed the blood work- with the patient in detail; also reviewed the imaging independently [as summarized above]; and with the patient in detail.

## 2016-03-19 NOTE — Progress Notes (Signed)
Patient is here for follow up, he just had scans done Friday.

## 2016-03-20 ENCOUNTER — Inpatient Hospital Stay: Payer: Medicare Other

## 2016-03-20 DIAGNOSIS — C61 Malignant neoplasm of prostate: Secondary | ICD-10-CM

## 2016-03-20 DIAGNOSIS — C7951 Secondary malignant neoplasm of bone: Principal | ICD-10-CM

## 2016-03-20 MED ORDER — DENOSUMAB 120 MG/1.7ML ~~LOC~~ SOLN
120.0000 mg | Freq: Once | SUBCUTANEOUS | Status: AC
  Administered 2016-03-20: 120 mg via SUBCUTANEOUS
  Filled 2016-03-20: qty 1.7

## 2016-03-23 ENCOUNTER — Ambulatory Visit: Payer: Medicare Other

## 2016-03-28 ENCOUNTER — Inpatient Hospital Stay: Payer: Medicare Other

## 2016-03-28 DIAGNOSIS — C61 Malignant neoplasm of prostate: Secondary | ICD-10-CM

## 2016-03-28 DIAGNOSIS — C7951 Secondary malignant neoplasm of bone: Principal | ICD-10-CM

## 2016-03-28 LAB — CBC WITH DIFFERENTIAL/PLATELET
Basophils Absolute: 0.1 10*3/uL (ref 0–0.1)
Basophils Relative: 1 %
EOS PCT: 0 %
Eosinophils Absolute: 0 10*3/uL (ref 0–0.7)
HEMATOCRIT: 32.3 % — AB (ref 40.0–52.0)
Hemoglobin: 10.8 g/dL — ABNORMAL LOW (ref 13.0–18.0)
LYMPHS ABS: 1.9 10*3/uL (ref 1.0–3.6)
LYMPHS PCT: 15 %
MCH: 31.5 pg (ref 26.0–34.0)
MCHC: 33.4 g/dL (ref 32.0–36.0)
MCV: 94.4 fL (ref 80.0–100.0)
MONO ABS: 0.6 10*3/uL (ref 0.2–1.0)
MONOS PCT: 4 %
NEUTROS ABS: 10.5 10*3/uL — AB (ref 1.4–6.5)
Neutrophils Relative %: 80 %
PLATELETS: 121 10*3/uL — AB (ref 150–440)
RBC: 3.43 MIL/uL — ABNORMAL LOW (ref 4.40–5.90)
RDW: 15.7 % — AB (ref 11.5–14.5)
WBC: 13.1 10*3/uL — ABNORMAL HIGH (ref 3.8–10.6)

## 2016-03-28 LAB — BASIC METABOLIC PANEL
Anion gap: 12 (ref 5–15)
BUN: 14 mg/dL (ref 6–20)
CALCIUM: 8.6 mg/dL — AB (ref 8.9–10.3)
CO2: 22 mmol/L (ref 22–32)
Chloride: 100 mmol/L — ABNORMAL LOW (ref 101–111)
Creatinine, Ser: 0.69 mg/dL (ref 0.61–1.24)
GFR calc Af Amer: >60 mL/min (ref >60)
GLUCOSE: 107 mg/dL — AB (ref 65–99)
Potassium: 4.7 mmol/L (ref 3.5–5.1)
Sodium: 134 mmol/L — ABNORMAL LOW (ref 135–145)

## 2016-04-10 ENCOUNTER — Inpatient Hospital Stay (HOSPITAL_BASED_OUTPATIENT_CLINIC_OR_DEPARTMENT_OTHER): Payer: Medicare Other | Admitting: Internal Medicine

## 2016-04-10 ENCOUNTER — Inpatient Hospital Stay: Payer: Medicare Other | Attending: Internal Medicine

## 2016-04-10 ENCOUNTER — Inpatient Hospital Stay: Payer: Medicare Other

## 2016-04-10 VITALS — BP 128/60 | HR 99 | Temp 98.0°F | Resp 18 | Wt 172.3 lb

## 2016-04-10 DIAGNOSIS — J069 Acute upper respiratory infection, unspecified: Secondary | ICD-10-CM | POA: Insufficient documentation

## 2016-04-10 DIAGNOSIS — Z7689 Persons encountering health services in other specified circumstances: Secondary | ICD-10-CM | POA: Insufficient documentation

## 2016-04-10 DIAGNOSIS — R0602 Shortness of breath: Secondary | ICD-10-CM

## 2016-04-10 DIAGNOSIS — C7951 Secondary malignant neoplasm of bone: Secondary | ICD-10-CM | POA: Insufficient documentation

## 2016-04-10 DIAGNOSIS — C61 Malignant neoplasm of prostate: Secondary | ICD-10-CM

## 2016-04-10 DIAGNOSIS — Z79899 Other long term (current) drug therapy: Secondary | ICD-10-CM | POA: Insufficient documentation

## 2016-04-10 DIAGNOSIS — Z7952 Long term (current) use of systemic steroids: Secondary | ICD-10-CM | POA: Diagnosis not present

## 2016-04-10 DIAGNOSIS — F17210 Nicotine dependence, cigarettes, uncomplicated: Secondary | ICD-10-CM | POA: Diagnosis not present

## 2016-04-10 DIAGNOSIS — Z8739 Personal history of other diseases of the musculoskeletal system and connective tissue: Secondary | ICD-10-CM | POA: Insufficient documentation

## 2016-04-10 DIAGNOSIS — J45909 Unspecified asthma, uncomplicated: Secondary | ICD-10-CM | POA: Diagnosis not present

## 2016-04-10 DIAGNOSIS — Z5111 Encounter for antineoplastic chemotherapy: Secondary | ICD-10-CM | POA: Insufficient documentation

## 2016-04-10 DIAGNOSIS — R011 Cardiac murmur, unspecified: Secondary | ICD-10-CM

## 2016-04-10 DIAGNOSIS — Z923 Personal history of irradiation: Secondary | ICD-10-CM

## 2016-04-10 DIAGNOSIS — E785 Hyperlipidemia, unspecified: Secondary | ICD-10-CM | POA: Diagnosis not present

## 2016-04-10 LAB — CBC WITH DIFFERENTIAL/PLATELET
BASOS ABS: 0 10*3/uL (ref 0–0.1)
BASOS PCT: 0 %
Eosinophils Absolute: 0 10*3/uL (ref 0–0.7)
Eosinophils Relative: 1 %
HEMATOCRIT: 30.3 % — AB (ref 40.0–52.0)
Hemoglobin: 10.2 g/dL — ABNORMAL LOW (ref 13.0–18.0)
LYMPHS PCT: 22 %
Lymphs Abs: 1.8 10*3/uL (ref 1.0–3.6)
MCH: 31.5 pg (ref 26.0–34.0)
MCHC: 33.6 g/dL (ref 32.0–36.0)
MCV: 93.8 fL (ref 80.0–100.0)
MONO ABS: 0.6 10*3/uL (ref 0.2–1.0)
Monocytes Relative: 7 %
NEUTROS ABS: 5.6 10*3/uL (ref 1.4–6.5)
Neutrophils Relative %: 70 %
PLATELETS: 191 10*3/uL (ref 150–440)
RBC: 3.23 MIL/uL — AB (ref 4.40–5.90)
RDW: 16.4 % — AB (ref 11.5–14.5)
WBC: 8.1 10*3/uL (ref 3.8–10.6)

## 2016-04-10 LAB — COMPREHENSIVE METABOLIC PANEL
ALBUMIN: 3.7 g/dL (ref 3.5–5.0)
ALT: 15 U/L — AB (ref 17–63)
AST: 25 U/L (ref 15–41)
Alkaline Phosphatase: 199 U/L — ABNORMAL HIGH (ref 38–126)
Anion gap: 12 (ref 5–15)
BILIRUBIN TOTAL: 0.6 mg/dL (ref 0.3–1.2)
BUN: 13 mg/dL (ref 6–20)
CHLORIDE: 101 mmol/L (ref 101–111)
CO2: 22 mmol/L (ref 22–32)
CREATININE: 0.65 mg/dL (ref 0.61–1.24)
Calcium: 8.7 mg/dL — ABNORMAL LOW (ref 8.9–10.3)
GFR calc Af Amer: >60 mL/min (ref >60)
GLUCOSE: 123 mg/dL — AB (ref 65–99)
POTASSIUM: 4.6 mmol/L (ref 3.5–5.1)
Sodium: 135 mmol/L (ref 135–145)
Total Protein: 7 g/dL (ref 6.5–8.1)

## 2016-04-10 MED ORDER — SODIUM CHLORIDE 0.9 % IV SOLN
Freq: Once | INTRAVENOUS | Status: AC
  Administered 2016-04-10: 10:00:00 via INTRAVENOUS
  Filled 2016-04-10: qty 1000

## 2016-04-10 MED ORDER — FAMOTIDINE IN NACL 20-0.9 MG/50ML-% IV SOLN
20.0000 mg | Freq: Once | INTRAVENOUS | Status: AC
  Administered 2016-04-10: 20 mg via INTRAVENOUS
  Filled 2016-04-10: qty 50

## 2016-04-10 MED ORDER — DEXAMETHASONE SODIUM PHOSPHATE 10 MG/ML IJ SOLN
10.0000 mg | Freq: Once | INTRAMUSCULAR | Status: AC
  Administered 2016-04-10: 10 mg via INTRAVENOUS
  Filled 2016-04-10: qty 1

## 2016-04-10 MED ORDER — SODIUM CHLORIDE 0.9 % IV SOLN: 10.0000 mg | Freq: Once | INTRAVENOUS | Status: DC

## 2016-04-10 MED ORDER — DIPHENHYDRAMINE HCL 50 MG/ML IJ SOLN
25.0000 mg | Freq: Once | INTRAMUSCULAR | Status: AC
  Administered 2016-04-10: 25 mg via INTRAVENOUS
  Filled 2016-04-10: qty 1

## 2016-04-10 MED ORDER — CABAZITAXEL CHEMO INJECTION 60 MG/6ML W/DILUENT
20.0000 mg/m2 | Freq: Once | INTRAVENOUS | Status: AC
  Administered 2016-04-10: 40 mg via INTRAVENOUS
  Filled 2016-04-10: qty 4

## 2016-04-10 MED ORDER — PEGFILGRASTIM 6 MG/0.6ML ~~LOC~~ PSKT
6.0000 mg | PREFILLED_SYRINGE | Freq: Once | SUBCUTANEOUS | Status: AC
  Administered 2016-04-10: 6 mg via SUBCUTANEOUS
  Filled 2016-04-10: qty 0.6

## 2016-04-10 NOTE — Assessment & Plan Note (Signed)
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  OCTOBER 2017 CT scan shows progressive lesions in the bone and no evidence of any visceral metastases. Bone scan NOv 2017- STABLE; progressive PSA on Taxotere. Status post cycle #1 of cabazitaxel appx 3 weeks ago.   # proceed with cycle #2. Labs okay. Patient tolerating treatment very well.  # URI-  Okay to use robitussin.   # Last Lupron in October 1 st week; next due in Jan 2018.    # follow up on 27th/ wed/ [place] [3 weeks calling provider]; labs-10 days CBC BMP PSA

## 2016-04-10 NOTE — Progress Notes (Signed)
Pt presnts today for follow up. Relates concerns of increased congestion increased respirations esp at night w congestion.

## 2016-04-10 NOTE — Progress Notes (Signed)
[place] OFFICE PROGRESS NOTE  Patient Care Team: Lenard Simmer, MD as PCP - General (Endocrinology)  No matching staging information was found for the patient.   Oncology History   1. Carcinoma of prostate diagnosis in 2006.  Had received radiation therapy, external beam to prostate and pelvic lymph nodes.  Had Gleason 9 (4+5.)  PainBaseline PSA was 13. 2. Received Lupron injection after her radiation therapy also.  PSA was going up.  Last year or so received Lupron injection in September but did not have any followup.   3. Recently (this August, 2012) PSA was found to be 17. Patient was started on Lupron from September of 2012. 4. Patient has progressive disease by PSA criteria (October, 2013) 5. Patient was started on Zytiga and prednisone in November of 2013. 6.traumatic hip fracture ((right femur) status post internally fixed ,no evidence of metastatic disease(May of 2014) 7.bone scan in July of 2014 at outside institutions shows metastases to left scapula and left 11th rib/.. 8.progressive On ZYTIGA by PSA criteria.  9[name], September of 2015- Progression- Jan 2016  # AUG 11th- Provenge 2 of planned 3 [sec to logistical issues]  # SEP 2017- START DOCETAXEL weekly; CT C/A/P-Progressive bone mets [compared to 2016 CT]PSA PROGRESSION; NOV 2017BONE SCAN STABLE  # NOV 21st 2017-  CABAZITAXEL q 3 W     Prostate cancer metastatic to bone ([place])   10/19/2015 Initial Diagnosis    Prostate cancer metastatic to bone [facility])        INTERVAL HISTORY:  Preston Wilson 80 y.o.  male pleasant patient above history of Metastatic prostate cancer status post multiple lines of therapy Currently cabazitaxel- Status post cycle #1 is here for follow-up.  Noted to have a nonproductive cough especially at nighttime. He is interested in using Robitussin. No unusual shortness of breath or hemoptysis. No swelling of the legs.  Patient continues to deny any swelling in  the legs or tingling or numbness. Denies any significant pain.  Denies any nausea vomiting. No skin rash. Continues to feel well overall.   REVIEW OF SYSTEMS:  A complete 10 point review of system is done which is negative except mentioned above/history of present illness.   PAST MEDICAL HISTORY :  Past Medical History:  Diagnosis Date  . Asthma   . Heart murmur   . Hyperlipidemia   . Hypocalcemia   . Osteomyelitis ([place])   . Prostate cancer ([place])   . SOB (shortness of breath)     PAST SURGICAL HISTORY :   Past Surgical History:  Procedure Laterality Date  . COLONOSCOPY WITH PROPOFOL N/A 04/12/2015   Procedure: COLONOSCOPY WITH PROPOFOL;  Surgeon: Lollie Sails, MD;  Location: [facility] ENDOSCOPY;  Service: Endoscopy;  Laterality: N/A;  . EXCISIONAL HEMORRHOIDECTOMY    . TONSILLECTOMY      FAMILY HISTORY :  No family history on file.  SOCIAL HISTORY:   Social History  Substance Use Topics  . Smoking status: Current Every Day Smoker    Packs/day: 0.50    Types: Cigarettes  . Smokeless tobacco: Never Used  . Alcohol use No    ALLERGIES:  has No Known Allergies.  MEDICATIONS:  Current Outpatient Prescriptions  Medication Sig Dispense Refill  . ADVAIR DISKUS 100-50 MCG/DOSE AEPB inhale 1 dose by mouth twice a day  0  . Leuprolide Acetate (LUPRON IJ) Inject 1 mL as directed every 3 (three) months.    . pravastatin (PRAVACHOL) 20 MG tablet Take 20  mg by mouth daily.    . predniSONE (DELTASONE) 5 MG tablet Take 1 tablet (5 mg total) by mouth 2 (two) times daily with a meal. 60 tablet 3  . Vitamin D, Ergocalciferol, (DRISDOL) 50000 UNITS CAPS capsule Take 50,000 Units by mouth once a week. Fridays  0  . calcium-vitamin D (OSCAL WITH D) 500-200 MG-UNIT per tablet Take 1 tablet by mouth daily with breakfast.     . dexamethasone (DECADRON) 4 MG tablet Take 1 pill every 12 hours; start the day prior to chemo x 3 days. (Patient not taking: Reported on 04/10/2016) 60 tablet 0  .  diphenoxylate-atropine (LOMOTIL) 2.5-0.025 MG tablet Take 1 tablet by mouth 4 (four) times daily as needed for diarrhea or loose stools. Take it along with immodium (Patient not taking: Reported on 04/10/2016) 40 tablet 0  . Multiple Vitamin (MULTIVITAMIN) tablet Take 1 tablet by mouth daily.    . ondansetron (ZOFRAN) 8 MG tablet 1 pill every 8 hours as needed for nausea/vomitting (Patient not taking: Reported on 04/10/2016) 40 tablet 1  . ONETOUCH DELICA LANCETS FINE MISC     . ONETOUCH VERIO test strip     . prochlorperazine (COMPAZINE) 10 MG tablet take 1 tablet by mouth every 6 hours if needed for nausea and vomiting  0   No current facility-administered medications for this visit.    Facility-Administered Medications Ordered in Other Visits  Medication Dose Route Frequency Provider Last Rate Last Dose  . 0.9 %  sodium chloride infusion   Intravenous Once Cammie Sickle, MD      . cabazitaxel (JEVTANA) 40 mg in dextrose 5 % 250 mL chemo infusion  20 mg/m2 (Treatment Plan Recorded) Intravenous Once Cammie Sickle, MD      . dexamethasone (DECADRON) 10 mg in sodium chloride 0.9 % 50 mL IVPB  10 mg Intravenous Once Cammie Sickle, MD      . diphenhydrAMINE (BENADRYL) injection 25 mg  25 mg Intravenous Once Cammie Sickle, MD      . famotidine (PEPCID) IVPB 20 mg premix  20 mg Intravenous Once Cammie Sickle, MD      . pegfilgrastim (NEULASTA ONPRO KIT) injection 6 mg  6 mg Subcutaneous Once Cammie Sickle, MD        PHYSICAL EXAMINATION: ECOG PERFORMANCE STATUS: 0 - Asymptomatic  BP 128/60 (BP Location: Left Arm, Patient Position: Sitting)   Pulse 99   Temp 98 F (36.7 C) (Tympanic)   Resp 18   Wt 172 lb 4.6 oz (78.1 kg)   BMI 23.37 kg/m   Filed Weights   04/10/16 0918  Weight: 172 lb 4.6 oz (78.1 kg)    GENERAL: Well-nourished well-developed; Alert, no distress and comfortable.   Accompanied by family. EYES: no pallor or icterus OROPHARYNX: no  thrush or ulceration; good dentition  NECK: supple, no masses felt LYMPH:  no palpable lymphadenopathy in the cervical, axillary or inguinal regions LUNGS: clear to auscultation and  No wheeze or crackles HEART/CVS: regular rate & rhythm and positive for murmurs; No lower extremity edema ABDOMEN:abdomen soft, non-tender and normal bowel sounds Musculoskeletal:no cyanosis of digits and no clubbing  PSYCH: alert & oriented x 3 with fluent speech NEURO: no focal motor/sensory deficits SKIN:  no rashes or significant lesions  LABORATORY DATA:  I have reviewed the data as listed    Component Value Date/Time   NA 135 04/10/2016 0848   NA 138 08/25/2014 1442   K 4.6 04/10/2016 0848  K 4.1 08/25/2014 1442   CL 101 04/10/2016 0848   CL 106 08/25/2014 1442   CO2 22 04/10/2016 0848   CO2 25 08/25/2014 1442   GLUCOSE 123 (H) 04/10/2016 0848   GLUCOSE 138 (H) 08/25/2014 1442   BUN 13 04/10/2016 0848   BUN 19 08/25/2014 1442   CREATININE 0.65 04/10/2016 0848   CREATININE 0.53 (L) 08/25/2014 1442   CALCIUM 8.7 (L) 04/10/2016 0848   CALCIUM 9.0 08/25/2014 1442   PROT 7.0 04/10/2016 0848   PROT 6.8 08/25/2014 1442   ALBUMIN 3.7 04/10/2016 0848   ALBUMIN 3.9 08/25/2014 1442   AST 25 04/10/2016 0848   AST 18 08/25/2014 1442   ALT 15 (L) 04/10/2016 0848   ALT 13 (L) 08/25/2014 1442   ALKPHOS 199 (H) 04/10/2016 0848   ALKPHOS 73 08/25/2014 1442   BILITOT 0.6 04/10/2016 0848   BILITOT 0.7 08/25/2014 1442   GFRNONAA >60 04/10/2016 0848   GFRNONAA >60 08/25/2014 1442   GFRAA >60 04/10/2016 0848   GFRAA >60 08/25/2014 1442    No results found for: SPEP, UPEP  Lab Results  Component Value Date   WBC 8.1 04/10/2016   NEUTROABS 5.6 04/10/2016   HGB 10.2 (L) 04/10/2016   HCT 30.3 (L) 04/10/2016   MCV 93.8 04/10/2016   PLT 191 04/10/2016      Chemistry      Component Value Date/Time   NA 135 04/10/2016 0848   NA 138 08/25/2014 1442   K 4.6 04/10/2016 0848   K 4.1 08/25/2014 1442    CL 101 04/10/2016 0848   CL 106 08/25/2014 1442   CO2 22 04/10/2016 0848   CO2 25 08/25/2014 1442   BUN 13 04/10/2016 0848   BUN 19 08/25/2014 1442   CREATININE 0.65 04/10/2016 0848   CREATININE 0.53 (L) 08/25/2014 1442      Component Value Date/Time   CALCIUM 8.7 (L) 04/10/2016 0848   CALCIUM 9.0 08/25/2014 1442   ALKPHOS 199 (H) 04/10/2016 0848   ALKPHOS 73 08/25/2014 1442   AST 25 04/10/2016 0848   AST 18 08/25/2014 1442   ALT 15 (L) 04/10/2016 0848   ALT 13 (L) 08/25/2014 1442   BILITOT 0.6 04/10/2016 0848   BILITOT 0.7 08/25/2014 1442      Results for KENDYL, BISSONNETTE (MRN [id number]) as of 03/19/2016 16:01  Ref. Range 12/12/2015 11:21 01/12/2016 13:00 01/20/2016 08:21 02/20/2016 08:57 03/12/2016 09:32  PSA Latest Ref Range: 0.00 - 4.00 ng/mL 99.80 (H) 268.00 (H) 296.00 (H) 756.00 (H) 1,325.00 (H)    RADIOGRAPHIC STUDIES: I have personally reviewed the radiological images as listed and agreed with the findings in the report. No results found.   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  OCTOBER 2017 CT scan shows progressive lesions in the bone and no evidence of any visceral metastases. Bone scan NOv 2017- STABLE; progressive PSA on Taxotere. Status post cycle #1 of cabazitaxel appx 3 weeks ago.   # proceed with cycle #2. Labs okay. Patient tolerating treatment very well.  # URI-  Okay to use robitussin.   # Last Lupron in October 1 st week; next due in Jan 2018.    # follow up on 27th/ wed/ [place] [3 weeks calling provider]; labs-10 days CBC BMP PSA  Orders Placed This Encounter  Procedures  . CBC with Differential    Standing Status:   Future    Standing Expiration  Date:   04/10/2017  . Comprehensive metabolic panel    Standing Status:   Future    Standing Expiration Date:   04/10/2017  . Basic metabolic panel    Standing Status:   Future     Standing Expiration Date:   04/10/2017  . PSA    Standing Status:   Future    Standing Expiration Date:   04/10/2017  . CBC with Differential    Standing Status:   Future    Standing Expiration Date:   04/10/2017     Cammie Sickle, MD 04/10/2016 9:46 AM

## 2016-04-11 ENCOUNTER — Other Ambulatory Visit: Payer: Self-pay

## 2016-04-12 ENCOUNTER — Ambulatory Visit: Payer: Self-pay

## 2016-04-12 ENCOUNTER — Ambulatory Visit: Payer: Self-pay | Admitting: Internal Medicine

## 2016-04-13 ENCOUNTER — Ambulatory Visit: Payer: Self-pay

## 2016-04-20 ENCOUNTER — Inpatient Hospital Stay: Payer: Medicare Other

## 2016-04-20 DIAGNOSIS — C7951 Secondary malignant neoplasm of bone: Principal | ICD-10-CM

## 2016-04-20 DIAGNOSIS — C61 Malignant neoplasm of prostate: Secondary | ICD-10-CM

## 2016-04-20 LAB — CBC WITH DIFFERENTIAL/PLATELET
Basophils Absolute: 0 10*3/uL (ref 0–0.1)
Basophils Relative: 0 %
Eosinophils Absolute: 0 10*3/uL (ref 0–0.7)
Eosinophils Relative: 0 %
HCT: 26.6 % — ABNORMAL LOW (ref 40.0–52.0)
Hemoglobin: 8.9 g/dL — ABNORMAL LOW (ref 13.0–18.0)
Lymphocytes Relative: 16 %
Lymphs Abs: 1.7 10*3/uL (ref 1.0–3.6)
MCH: 31.1 pg (ref 26.0–34.0)
MCHC: 33.5 g/dL (ref 32.0–36.0)
MCV: 93 fL (ref 80.0–100.0)
Monocytes Absolute: 0.4 10*3/uL (ref 0.2–1.0)
Monocytes Relative: 3 %
Neutro Abs: 8.9 10*3/uL — ABNORMAL HIGH (ref 1.4–6.5)
Neutrophils Relative %: 81 %
Platelets: 146 10*3/uL — ABNORMAL LOW (ref 150–440)
RBC: 2.86 MIL/uL — ABNORMAL LOW (ref 4.40–5.90)
RDW: 16.5 % — ABNORMAL HIGH (ref 11.5–14.5)
WBC: 11 10*3/uL — ABNORMAL HIGH (ref 3.8–10.6)

## 2016-04-20 LAB — PSA: PSA: 2886 ng/mL — ABNORMAL HIGH (ref 0.00–4.00)

## 2016-04-20 LAB — BASIC METABOLIC PANEL
ANION GAP: 7 (ref 5–15)
BUN: 14 mg/dL (ref 6–20)
CALCIUM: 8.7 mg/dL — AB (ref 8.9–10.3)
CO2: 24 mmol/L (ref 22–32)
Chloride: 103 mmol/L (ref 101–111)
Creatinine, Ser: 0.54 mg/dL — ABNORMAL LOW (ref 0.61–1.24)
GFR calc Af Amer: >60 mL/min (ref >60)
GLUCOSE: 114 mg/dL — AB (ref 65–99)
Potassium: 4.6 mmol/L (ref 3.5–5.1)
Sodium: 134 mmol/L — ABNORMAL LOW (ref 135–145)

## 2016-04-23 ENCOUNTER — Telehealth: Payer: Self-pay | Admitting: Internal Medicine

## 2016-04-23 ENCOUNTER — Other Ambulatory Visit: Payer: Self-pay | Admitting: *Deleted

## 2016-04-23 ENCOUNTER — Other Ambulatory Visit: Payer: Self-pay | Admitting: Internal Medicine

## 2016-04-23 DIAGNOSIS — C61 Malignant neoplasm of prostate: Secondary | ICD-10-CM

## 2016-04-23 DIAGNOSIS — C7951 Secondary malignant neoplasm of bone: Principal | ICD-10-CM

## 2016-04-23 NOTE — Telephone Encounter (Signed)
msg sent to sch. To arrange for scan and apt with [facility]

## 2016-04-23 NOTE — Telephone Encounter (Signed)
Spoke to pt's wife re: rising PSA- 2800+ s/p 2 cycles of cabazitaxel; recommend adding carboplatin AUC 4 with cabazitaxel [ASCO 2016; abstract 5020]. Also added Aloxi.  # Also recommend a CT scan prior to cycle #4 [Jan 16th 2018] definite lesions amenable to biopsy recommend biopsy. Check [facility]; M[facility]]  # Also recommend second opinion referral to [facility]. Patient's wife agrees.  # heather- please set up CT scan in second week of Jan 2018/also referral to [place].

## 2016-04-26 ENCOUNTER — Telehealth: Payer: Self-pay | Admitting: *Deleted

## 2016-04-26 NOTE — Telephone Encounter (Signed)
Called to report that he decided he does not need the shot.

## 2016-05-01 ENCOUNTER — Other Ambulatory Visit: Payer: Self-pay | Admitting: Oncology

## 2016-05-01 NOTE — Telephone Encounter (Signed)
Contacted patient's wife back - She wanted to know if pt is due for xgeva. She voiced concerns that "new adding carboplatin would interfere with xgeva." pt going out of town on Thursday 05/03/16 this week. Husband is not on schedule for xgeva. Pt prefer rcving the xgeva on a different day than his combination chemotherapy. She stated that will discuss her concerns this with Dr. Grayland Ormond tomorrow at her husband's visit. Last xgeva given on 03/20/16.

## 2016-05-02 ENCOUNTER — Inpatient Hospital Stay (HOSPITAL_BASED_OUTPATIENT_CLINIC_OR_DEPARTMENT_OTHER): Payer: Medicare Other | Admitting: Oncology

## 2016-05-02 ENCOUNTER — Inpatient Hospital Stay: Payer: Medicare Other

## 2016-05-02 ENCOUNTER — Other Ambulatory Visit: Payer: Self-pay

## 2016-05-02 VITALS — BP 126/65 | HR 116 | Temp 96.8°F | Resp 18 | Wt 171.5 lb

## 2016-05-02 DIAGNOSIS — J069 Acute upper respiratory infection, unspecified: Secondary | ICD-10-CM | POA: Diagnosis not present

## 2016-05-02 DIAGNOSIS — C7951 Secondary malignant neoplasm of bone: Principal | ICD-10-CM

## 2016-05-02 DIAGNOSIS — R0602 Shortness of breath: Secondary | ICD-10-CM | POA: Diagnosis not present

## 2016-05-02 DIAGNOSIS — C61 Malignant neoplasm of prostate: Secondary | ICD-10-CM

## 2016-05-02 DIAGNOSIS — F17210 Nicotine dependence, cigarettes, uncomplicated: Secondary | ICD-10-CM

## 2016-05-02 DIAGNOSIS — R011 Cardiac murmur, unspecified: Secondary | ICD-10-CM

## 2016-05-02 DIAGNOSIS — Z79899 Other long term (current) drug therapy: Secondary | ICD-10-CM

## 2016-05-02 DIAGNOSIS — Z7952 Long term (current) use of systemic steroids: Secondary | ICD-10-CM

## 2016-05-02 DIAGNOSIS — E785 Hyperlipidemia, unspecified: Secondary | ICD-10-CM

## 2016-05-02 DIAGNOSIS — Z923 Personal history of irradiation: Secondary | ICD-10-CM

## 2016-05-02 DIAGNOSIS — Z8739 Personal history of other diseases of the musculoskeletal system and connective tissue: Secondary | ICD-10-CM

## 2016-05-02 DIAGNOSIS — J45909 Unspecified asthma, uncomplicated: Secondary | ICD-10-CM

## 2016-05-02 LAB — CBC WITH DIFFERENTIAL/PLATELET
BASOS ABS: 0 10*3/uL (ref 0–0.1)
BASOS PCT: 1 %
EOS ABS: 0 10*3/uL (ref 0–0.7)
Eosinophils Relative: 0 %
HCT: 28.3 % — ABNORMAL LOW (ref 40.0–52.0)
HEMOGLOBIN: 9.6 g/dL — AB (ref 13.0–18.0)
Lymphocytes Relative: 18 %
Lymphs Abs: 1.7 10*3/uL (ref 1.0–3.6)
MCH: 31.4 pg (ref 26.0–34.0)
MCHC: 33.8 g/dL (ref 32.0–36.0)
MCV: 93.1 fL (ref 80.0–100.0)
Monocytes Absolute: 0.5 10*3/uL (ref 0.2–1.0)
Monocytes Relative: 6 %
NEUTROS PCT: 75 %
Neutro Abs: 6.7 10*3/uL — ABNORMAL HIGH (ref 1.4–6.5)
Platelets: 179 10*3/uL (ref 150–440)
RBC: 3.04 MIL/uL — AB (ref 4.40–5.90)
RDW: 17.4 % — ABNORMAL HIGH (ref 11.5–14.5)
WBC: 9 10*3/uL (ref 3.8–10.6)

## 2016-05-02 LAB — COMPREHENSIVE METABOLIC PANEL
ALK PHOS: 297 U/L — AB (ref 38–126)
ALT: 15 U/L — AB (ref 17–63)
AST: 36 U/L (ref 15–41)
Albumin: 3.7 g/dL (ref 3.5–5.0)
Anion gap: 7 (ref 5–15)
BUN: 15 mg/dL (ref 6–20)
CALCIUM: 8.7 mg/dL — AB (ref 8.9–10.3)
CHLORIDE: 103 mmol/L (ref 101–111)
CO2: 24 mmol/L (ref 22–32)
CREATININE: 0.49 mg/dL — AB (ref 0.61–1.24)
GFR calc Af Amer: >60 mL/min (ref >60)
Glucose, Bld: 127 mg/dL — ABNORMAL HIGH (ref 65–99)
Potassium: 4.4 mmol/L (ref 3.5–5.1)
Sodium: 134 mmol/L — ABNORMAL LOW (ref 135–145)
Total Bilirubin: 0.7 mg/dL (ref 0.3–1.2)
Total Protein: 7.1 g/dL (ref 6.5–8.1)

## 2016-05-02 MED ORDER — DEXAMETHASONE SODIUM PHOSPHATE 10 MG/ML IJ SOLN
10.0000 mg | Freq: Once | INTRAMUSCULAR | Status: AC
  Administered 2016-05-02: 10 mg via INTRAVENOUS
  Filled 2016-05-02: qty 1

## 2016-05-02 MED ORDER — PEGFILGRASTIM 6 MG/0.6ML ~~LOC~~ PSKT
6.0000 mg | PREFILLED_SYRINGE | Freq: Once | SUBCUTANEOUS | Status: AC
  Administered 2016-05-02: 6 mg via SUBCUTANEOUS
  Filled 2016-05-02: qty 0.6

## 2016-05-02 MED ORDER — PALONOSETRON HCL INJECTION 0.25 MG/5ML
0.2500 mg | Freq: Once | INTRAVENOUS | Status: AC
  Administered 2016-05-02: 0.25 mg via INTRAVENOUS
  Filled 2016-05-02: qty 5

## 2016-05-02 MED ORDER — SODIUM CHLORIDE 0.9 % IV SOLN: 10.0000 mg | Freq: Once | INTRAVENOUS | Status: DC

## 2016-05-02 MED ORDER — SODIUM CHLORIDE 0.9 % IV SOLN
Freq: Once | INTRAVENOUS | Status: AC
  Administered 2016-05-02: 12:00:00 via INTRAVENOUS
  Filled 2016-05-02: qty 1000

## 2016-05-02 MED ORDER — SODIUM CHLORIDE 0.9 % IV SOLN
371.2000 mg | Freq: Once | INTRAVENOUS | Status: AC
  Administered 2016-05-02: 370 mg via INTRAVENOUS
  Filled 2016-05-02: qty 37

## 2016-05-02 MED ORDER — CABAZITAXEL CHEMO INJECTION 60 MG/6ML W/DILUENT
20.0000 mg/m2 | Freq: Once | INTRAVENOUS | Status: AC
  Administered 2016-05-02: 40 mg via INTRAVENOUS
  Filled 2016-05-02: qty 4

## 2016-05-02 MED ORDER — DIPHENHYDRAMINE HCL 50 MG/ML IJ SOLN
25.0000 mg | Freq: Once | INTRAMUSCULAR | Status: AC
  Administered 2016-05-02: 25 mg via INTRAVENOUS
  Filled 2016-05-02: qty 1

## 2016-05-02 MED ORDER — FAMOTIDINE IN NACL 20-0.9 MG/50ML-% IV SOLN
20.0000 mg | Freq: Once | INTRAVENOUS | Status: AC
  Administered 2016-05-02: 20 mg via INTRAVENOUS
  Filled 2016-05-02: qty 50

## 2016-05-02 MED ORDER — DENOSUMAB 120 MG/1.7ML ~~LOC~~ SOLN
120.0000 mg | Freq: Once | SUBCUTANEOUS | Status: AC
  Administered 2016-05-02: 120 mg via SUBCUTANEOUS
  Filled 2016-05-02: qty 1.7

## 2016-05-02 NOTE — Progress Notes (Signed)
Offers no complaints  

## 2016-05-04 NOTE — Progress Notes (Signed)
[place] OFFICE PROGRESS NOTE  Patient Care Team: Lenard Simmer, MD as PCP - General (Endocrinology)  No matching staging information was found for the patient.   Oncology History   1. Carcinoma of prostate diagnosis in 2006.  Had received radiation therapy, external beam to prostate and pelvic lymph nodes.  Had Gleason 9 (4+5.)  PainBaseline PSA was 13. 2. Received Lupron injection after her radiation therapy also.  PSA was going up.  Last year or so received Lupron injection in September but did not have any followup.   3. Recently (this August, 2012) PSA was found to be 17. Patient was started on Lupron from September of 2012. 4. Patient has progressive disease by PSA criteria (October, 2013) 5. Patient was started on Zytiga and prednisone in November of 2013. 6.traumatic hip fracture ((right femur) status post internally fixed ,no evidence of metastatic disease(May of 2014) 7.bone scan in July of 2014 at outside institutions shows metastases to left scapula and left 11th rib/.. 8.progressive On ZYTIGA by PSA criteria.  9[name], September of 2015- Progression- Jan 2016  # AUG 11th 2017- Provenge 2 of planned 3 [sec to logistical issues]  # SEP 2017- START DOCETAXEL weekly; CT C/A/P-Progressive bone mets [compared to 2016 CT]PSA PROGRESSION; NOV 2017BONE SCAN STABLE  # NOV 21st 2017-  CABAZITAXEL q 3 W     Prostate cancer metastatic to bone ([place])   10/19/2015 Initial Diagnosis    Prostate cancer metastatic to bone [facility])        INTERVAL HISTORY:  Preston Wilson 80 y.o.  male pleasant patient above history of Metastatic prostate cancer status post multiple lines of therapyReturns to clinic today for further evaluation and consideration of cycle 1 of carboplatinum plus cabazitaxel. Patient is also scheduled to receive Xgeva today. He is anxious regarding his new treatment, but otherwise feels well. He has no neurologic complaints. He does not  complain of pain today. He has good appetite and denies weight loss. He has no chest pain or shortness of breath. He denies any further cough. He denies any nausea, vomiting, constipation, or diarrhea. Patient offers no further specific complaints today.   REVIEW OF SYSTEMS:  A complete 10 point review of system is done which is negative except mentioned above/history of present illness.   PAST MEDICAL HISTORY :  Past Medical History:  Diagnosis Date  . Asthma   . Heart murmur   . Hyperlipidemia   . Hypocalcemia   . Osteomyelitis ([place])   . Prostate cancer ([place])   . SOB (shortness of breath)     PAST SURGICAL HISTORY :   Past Surgical History:  Procedure Laterality Date  . COLONOSCOPY WITH PROPOFOL N/A 04/12/2015   Procedure: COLONOSCOPY WITH PROPOFOL;  Surgeon: Lollie Sails, MD;  Location: [facility] ENDOSCOPY;  Service: Endoscopy;  Laterality: N/A;  . EXCISIONAL HEMORRHOIDECTOMY    . TONSILLECTOMY      FAMILY HISTORY :  No family history on file. Reviewed and unchanged. No reported history of malignancy or chronic disease.  SOCIAL HISTORY:   Social History  Substance Use Topics  . Smoking status: Current Every Day Smoker    Packs/day: 0.50    Types: Cigarettes  . Smokeless tobacco: Never Used  . Alcohol use No    ALLERGIES:  has No Known Allergies.  MEDICATIONS:  Current Outpatient Prescriptions  Medication Sig Dispense Refill  . ADVAIR DISKUS 100-50 MCG/DOSE AEPB inhale 1 dose by mouth twice a day  0  .  calcium-vitamin D (OSCAL WITH D) 500-200 MG-UNIT per tablet Take 1 tablet by mouth daily with breakfast.     . Leuprolide Acetate (LUPRON IJ) Inject 1 mL as directed every 3 (three) months.    . Multiple Vitamin (MULTIVITAMIN) tablet Take 1 tablet by mouth daily.    . pravastatin (PRAVACHOL) 20 MG tablet Take 20 mg by mouth daily.    . predniSONE (DELTASONE) 5 MG tablet Take 1 tablet (5 mg total) by mouth 2 (two) times daily with a meal. 60 tablet 3  . prochlorperazine  (COMPAZINE) 10 MG tablet take 1 tablet by mouth every 6 hours if needed for nausea and vomiting  0  . Vitamin D, Ergocalciferol, (DRISDOL) 50000 UNITS CAPS capsule Take 50,000 Units by mouth once a week. Fridays  0  . dexamethasone (DECADRON) 4 MG tablet Take 1 pill every 12 hours; start the day prior to chemo x 3 days. (Patient not taking: Reported on 05/02/2016) 60 tablet 0  . diphenoxylate-atropine (LOMOTIL) 2.5-0.025 MG tablet Take 1 tablet by mouth 4 (four) times daily as needed for diarrhea or loose stools. Take it along with immodium (Patient not taking: Reported on 05/02/2016) 40 tablet 0  . ondansetron (ZOFRAN) 8 MG tablet 1 pill every 8 hours as needed for nausea/vomitting (Patient not taking: Reported on 05/02/2016) 40 tablet 1   No current facility-administered medications for this visit.     PHYSICAL EXAMINATION: ECOG PERFORMANCE STATUS: 0 - Asymptomatic  BP 126/65 (BP Location: Left Arm, Patient Position: Sitting)   Pulse (!) 116   Temp (!) 96.8 F (36 C) (Tympanic)   Resp 18   Wt 171 lb 8.3 oz (77.8 kg)   BMI 23.26 kg/m   Filed Weights   05/02/16 1012  Weight: 171 lb 8.3 oz (77.8 kg)    GENERAL: Well-nourished well-developed; Alert, no distress and comfortable.   Accompanied by family. EYES: no pallor or icterus OROPHARYNX: no thrush or ulceration; good dentition  NECK: supple, no masses felt LYMPH:  no palpable lymphadenopathy in the cervical, axillary or inguinal regions LUNGS: clear to auscultation and  No wheeze or crackles HEART/CVS: regular rate & rhythm and positive for murmurs; No lower extremity edema ABDOMEN:abdomen soft, non-tender and normal bowel sounds Musculoskeletal:no cyanosis of digits and no clubbing  PSYCH: alert & oriented x 3 with fluent speech NEURO: no focal motor/sensory deficits SKIN:  no rashes or significant lesions  LABORATORY DATA:  I have reviewed the data as listed    Component Value Date/Time   NA 134 (L) 05/02/2016 0927   NA  138 08/25/2014 1442   K 4.4 05/02/2016 0927   K 4.1 08/25/2014 1442   CL 103 05/02/2016 0927   CL 106 08/25/2014 1442   CO2 24 05/02/2016 0927   CO2 25 08/25/2014 1442   GLUCOSE 127 (H) 05/02/2016 0927   GLUCOSE 138 (H) 08/25/2014 1442   BUN 15 05/02/2016 0927   BUN 19 08/25/2014 1442   CREATININE 0.49 (L) 05/02/2016 0927   CREATININE 0.53 (L) 08/25/2014 1442   CALCIUM 8.7 (L) 05/02/2016 0927   CALCIUM 9.0 08/25/2014 1442   PROT 7.1 05/02/2016 0927   PROT 6.8 08/25/2014 1442   ALBUMIN 3.7 05/02/2016 0927   ALBUMIN 3.9 08/25/2014 1442   AST 36 05/02/2016 0927   AST 18 08/25/2014 1442   ALT 15 (L) 05/02/2016 0927   ALT 13 (L) 08/25/2014 1442   ALKPHOS 297 (H) 05/02/2016 0927   ALKPHOS 73 08/25/2014 1442   BILITOT 0.7  05/02/2016 0927   BILITOT 0.7 08/25/2014 1442   GFRNONAA >60 05/02/2016 0927   GFRNONAA >60 08/25/2014 1442   GFRAA >60 05/02/2016 0927   GFRAA >60 08/25/2014 1442    No results found for: SPEP, UPEP  Lab Results  Component Value Date   WBC 9.0 05/02/2016   NEUTROABS 6.7 (H) 05/02/2016   HGB 9.6 (L) 05/02/2016   HCT 28.3 (L) 05/02/2016   MCV 93.1 05/02/2016   PLT 179 05/02/2016      Chemistry      Component Value Date/Time   NA 134 (L) 05/02/2016 0927   NA 138 08/25/2014 1442   K 4.4 05/02/2016 0927   K 4.1 08/25/2014 1442   CL 103 05/02/2016 0927   CL 106 08/25/2014 1442   CO2 24 05/02/2016 0927   CO2 25 08/25/2014 1442   BUN 15 05/02/2016 0927   BUN 19 08/25/2014 1442   CREATININE 0.49 (L) 05/02/2016 0927   CREATININE 0.53 (L) 08/25/2014 1442      Component Value Date/Time   CALCIUM 8.7 (L) 05/02/2016 0927   CALCIUM 9.0 08/25/2014 1442   ALKPHOS 297 (H) 05/02/2016 0927   ALKPHOS 73 08/25/2014 1442   AST 36 05/02/2016 0927   AST 18 08/25/2014 1442   ALT 15 (L) 05/02/2016 0927   ALT 13 (L) 08/25/2014 1442   BILITOT 0.7 05/02/2016 0927   BILITOT 0.7 08/25/2014 1442      Results for TRESTIN, MORELAN (MRN [id number]) as of  03/19/2016 16:01  Ref. Range 12/12/2015 11:21 01/12/2016 13:00 01/20/2016 08:21 02/20/2016 08:57 03/12/2016 09:32  PSA Latest Ref Range: 0.00 - 4.00 ng/mL 99.80 (H) 268.00 (H) 296.00 (H) 756.00 (H) 1,325.00 (H)    Lab Results  Component Value Date   PSA 2,886.00 (H) 04/20/2016     RADIOGRAPHIC STUDIES: I have personally reviewed the radiological images as listed and agreed with the findings in the report. No results found.   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  OCTOBER 2017 CT scan shows progressive lesions in the bone and no evidence of any visceral metastases. Bone scan Nov 2017- STABLE; progressive PSA on Taxotere..   # Patient's PSA continues to trend up, therefore proceed with carboplatinum plus cabazitaxel today.   # Patient's also received Xgeva today for his bony metastasis.  # URI-   resolved.  # Last Lupron in October 1 st week; next due in Jan 2018.    # Return to clinic in 3 weeks for consideration of cycle 2 and M.D. evaluation.    Lloyd Huger, MD 05/04/2016 10:33 AM

## 2016-05-23 ENCOUNTER — Other Ambulatory Visit: Payer: Self-pay

## 2016-05-23 ENCOUNTER — Ambulatory Visit: Payer: Self-pay

## 2016-05-23 ENCOUNTER — Ambulatory Visit: Payer: Self-pay | Admitting: Internal Medicine

## 2016-05-25 ENCOUNTER — Inpatient Hospital Stay: Payer: Medicare Other

## 2016-05-25 ENCOUNTER — Inpatient Hospital Stay: Payer: Medicare Other | Attending: Internal Medicine | Admitting: Internal Medicine

## 2016-05-25 VITALS — BP 133/72 | HR 105 | Temp 97.3°F | Resp 20 | Ht 72.0 in | Wt 172.0 lb

## 2016-05-25 VITALS — BP 147/77 | HR 108 | Temp 97.8°F | Resp 19

## 2016-05-25 DIAGNOSIS — Z8781 Personal history of (healed) traumatic fracture: Secondary | ICD-10-CM | POA: Diagnosis not present

## 2016-05-25 DIAGNOSIS — Z923 Personal history of irradiation: Secondary | ICD-10-CM

## 2016-05-25 DIAGNOSIS — R5383 Other fatigue: Secondary | ICD-10-CM

## 2016-05-25 DIAGNOSIS — Z79818 Long term (current) use of other agents affecting estrogen receptors and estrogen levels: Secondary | ICD-10-CM | POA: Diagnosis not present

## 2016-05-25 DIAGNOSIS — D649 Anemia, unspecified: Secondary | ICD-10-CM | POA: Diagnosis not present

## 2016-05-25 DIAGNOSIS — F17210 Nicotine dependence, cigarettes, uncomplicated: Secondary | ICD-10-CM

## 2016-05-25 DIAGNOSIS — C61 Malignant neoplasm of prostate: Secondary | ICD-10-CM

## 2016-05-25 DIAGNOSIS — Z8739 Personal history of other diseases of the musculoskeletal system and connective tissue: Secondary | ICD-10-CM | POA: Diagnosis not present

## 2016-05-25 DIAGNOSIS — R0602 Shortness of breath: Secondary | ICD-10-CM | POA: Diagnosis not present

## 2016-05-25 DIAGNOSIS — C7951 Secondary malignant neoplasm of bone: Secondary | ICD-10-CM | POA: Diagnosis not present

## 2016-05-25 DIAGNOSIS — E785 Hyperlipidemia, unspecified: Secondary | ICD-10-CM | POA: Diagnosis not present

## 2016-05-25 DIAGNOSIS — Z5111 Encounter for antineoplastic chemotherapy: Secondary | ICD-10-CM | POA: Diagnosis not present

## 2016-05-25 DIAGNOSIS — R011 Cardiac murmur, unspecified: Secondary | ICD-10-CM

## 2016-05-25 DIAGNOSIS — J45909 Unspecified asthma, uncomplicated: Secondary | ICD-10-CM | POA: Diagnosis not present

## 2016-05-25 DIAGNOSIS — Z79899 Other long term (current) drug therapy: Secondary | ICD-10-CM

## 2016-05-25 LAB — CBC WITH DIFFERENTIAL/PLATELET
BASOS ABS: 0 10*3/uL (ref 0–0.1)
BASOS PCT: 1 %
Eosinophils Absolute: 0 10*3/uL (ref 0–0.7)
Eosinophils Relative: 0 %
HCT: 24 % — ABNORMAL LOW (ref 40.0–52.0)
HEMOGLOBIN: 8.1 g/dL — AB (ref 13.0–18.0)
LYMPHS ABS: 1.6 10*3/uL (ref 1.0–3.6)
Lymphocytes Relative: 31 %
MCH: 31.7 pg (ref 26.0–34.0)
MCHC: 33.7 g/dL (ref 32.0–36.0)
MCV: 94 fL (ref 80.0–100.0)
Monocytes Absolute: 0.4 10*3/uL (ref 0.2–1.0)
Monocytes Relative: 7 %
NEUTROS PCT: 61 %
Neutro Abs: 3.1 10*3/uL (ref 1.4–6.5)
Platelets: 143 10*3/uL — ABNORMAL LOW (ref 150–440)
RBC: 2.55 MIL/uL — AB (ref 4.40–5.90)
RDW: 18.8 % — ABNORMAL HIGH (ref 11.5–14.5)
WBC: 5.1 10*3/uL (ref 3.8–10.6)

## 2016-05-25 LAB — COMPREHENSIVE METABOLIC PANEL
ALBUMIN: 3.8 g/dL (ref 3.5–5.0)
ALK PHOS: 312 U/L — AB (ref 38–126)
ALT: 14 U/L — AB (ref 17–63)
AST: 23 U/L (ref 15–41)
Anion gap: 8 (ref 5–15)
BUN: 15 mg/dL (ref 6–20)
CO2: 22 mmol/L (ref 22–32)
CREATININE: 0.5 mg/dL — AB (ref 0.61–1.24)
Calcium: 8.9 mg/dL (ref 8.9–10.3)
Chloride: 106 mmol/L (ref 101–111)
GFR calc Af Amer: >60 mL/min (ref >60)
GFR calc non Af Amer: >60 mL/min (ref >60)
GLUCOSE: 120 mg/dL — AB (ref 65–99)
Potassium: 4.1 mmol/L (ref 3.5–5.1)
SODIUM: 136 mmol/L (ref 135–145)
Total Bilirubin: 0.5 mg/dL (ref 0.3–1.2)
Total Protein: 6.7 g/dL (ref 6.5–8.1)

## 2016-05-25 LAB — PSA: PSA: 2409 ng/mL — ABNORMAL HIGH (ref 0.00–4.00)

## 2016-05-25 MED ORDER — PEGFILGRASTIM 6 MG/0.6ML ~~LOC~~ PSKT
6.0000 mg | PREFILLED_SYRINGE | Freq: Once | SUBCUTANEOUS | Status: AC
  Administered 2016-05-25: 6 mg via SUBCUTANEOUS
  Filled 2016-05-25: qty 0.6

## 2016-05-25 MED ORDER — PALONOSETRON HCL INJECTION 0.25 MG/5ML
0.2500 mg | Freq: Once | INTRAVENOUS | Status: AC
  Administered 2016-05-25: 0.25 mg via INTRAVENOUS
  Filled 2016-05-25: qty 5

## 2016-05-25 MED ORDER — DEXTROSE 5 % IV SOLN
20.0000 mg/m2 | Freq: Once | INTRAVENOUS | Status: AC
  Administered 2016-05-25: 40 mg via INTRAVENOUS
  Filled 2016-05-25: qty 4

## 2016-05-25 MED ORDER — CARBOPLATIN CHEMO INJECTION 600 MG/60ML
464.0000 mg | Freq: Once | INTRAVENOUS | Status: AC
  Administered 2016-05-25: 460 mg via INTRAVENOUS
  Filled 2016-05-25: qty 46

## 2016-05-25 MED ORDER — DEXAMETHASONE SODIUM PHOSPHATE 10 MG/ML IJ SOLN
10.0000 mg | Freq: Once | INTRAMUSCULAR | Status: AC
  Administered 2016-05-25: 10 mg via INTRAVENOUS
  Filled 2016-05-25: qty 1

## 2016-05-25 MED ORDER — SODIUM CHLORIDE 0.9 % IV SOLN
Freq: Once | INTRAVENOUS | Status: AC
  Administered 2016-05-25: 11:00:00 via INTRAVENOUS
  Filled 2016-05-25: qty 1000

## 2016-05-25 MED ORDER — DIPHENHYDRAMINE HCL 50 MG/ML IJ SOLN
25.0000 mg | Freq: Once | INTRAMUSCULAR | Status: AC
  Administered 2016-05-25: 25 mg via INTRAVENOUS
  Filled 2016-05-25: qty 1

## 2016-05-25 MED ORDER — FAMOTIDINE IN NACL 20-0.9 MG/50ML-% IV SOLN
20.0000 mg | Freq: Once | INTRAVENOUS | Status: AC
  Administered 2016-05-25: 20 mg via INTRAVENOUS
  Filled 2016-05-25: qty 50

## 2016-05-25 MED ORDER — SODIUM CHLORIDE 0.9 % IV SOLN: 10.0000 mg | Freq: Once | INTRAVENOUS | Status: DC

## 2016-05-25 NOTE — Progress Notes (Signed)
[facility] OFFICE PROGRESS NOTE  Patient Care Team: Preston Simmer, MD as PCP - General (Endocrinology)  Cancer Staging No matching staging information was found for the patient.   Oncology History   1. Carcinoma of prostate diagnosis in 2006.  Had received radiation therapy, external beam to prostate and pelvic lymph nodes.  Had Gleason 9 (4+5.)  PainBaseline PSA was 13. 2. Received Lupron injection after her radiation therapy also.  PSA was going up.  Last year or so received Lupron injection in September but did not have any followup.   3. Recently (this August, 2012) PSA was found to be 17. Patient was started on Lupron from September of 2012. 4. Patient has progressive disease by PSA criteria (October, 2013) 5. Patient was started on Zytiga and prednisone in November of 2013. 6.traumatic hip fracture ((right femur) status post internally fixed ,no evidence of metastatic disease(May of 2014) 7.bone scan in July of 2014 at outside institutions shows metastases to left scapula and left 11th rib/.. 8.progressive On ZYTIGA by PSA criteria.  9Gillermina Wilson, September of 2015- Progression- Jan 2016  # AUG 11th 2017- Provenge 2 of planned 3 [sec to logistical issues]  # SEP 2017- START DOCETAXEL weekly; CT C/A/P-Progressive bone mets [compared to 2016 CT]PSA PROGRESSION; NOV 2017BONE SCAN STABLE  # NOV 21st 2017-  CABAZITAXEL q 3 W; 05/02/2016-Added Carboplatin with cycle #3 [given rising PSA]     Prostate cancer metastatic to bone ([facility])   10/19/2015 Initial Diagnosis    Prostate cancer metastatic to bone ([facility] Wilson)        INTERVAL HISTORY:  Preston Wilson 81 y.o.  male pleasant patient above history of Metastatic prostate cancer status post multiple lines of therapy Currently cabazitaxel- [added carboplatin AUC 4 with cycle #3] Status post cycle #3 is here for follow-up.  Patient had a second opinion appointment at [facility]- which had to be postponed because of the  bad weather. I recommend a call and make an appointment as soon as possible  Patient complains of fatigue. However denies any pain. Denies any fevers or chills. No cough. No swelling of the legs. Denies any tingling or numbness. No sores in the mouth. No diarrhea.  Continues to feel well overall.   REVIEW OF SYSTEMS:  A complete 10 point review of system is done which is negative except mentioned above/history of present illness.   PAST MEDICAL HISTORY :  Past Medical History:  Diagnosis Date  . Asthma   . Heart murmur   . Hyperlipidemia   . Hypocalcemia   . Osteomyelitis ([place] [facility])   . Prostate cancer ([place])   . SOB (shortness of breath)     PAST SURGICAL HISTORY :   Past Surgical History:  Procedure Laterality Date  . COLONOSCOPY WITH PROPOFOL N/A 04/12/2015   Procedure: COLONOSCOPY WITH PROPOFOL;  Surgeon: Preston Sails, MD;  Location: [facility] ENDOSCOPY;  Service: Endoscopy;  Laterality: N/A;  . EXCISIONAL HEMORRHOIDECTOMY    . TONSILLECTOMY      FAMILY HISTORY :  No family history on file.  SOCIAL HISTORY:   Social History  Substance Use Topics  . Smoking status: Current Every Day Smoker    Packs/day: 0.50    Types: Cigarettes  . Smokeless tobacco: Never Used  . Alcohol use No    ALLERGIES:  has No Known Allergies.  MEDICATIONS:  Current Outpatient Prescriptions  Medication Sig Dispense Refill  . ADVAIR DISKUS 100-50 MCG/DOSE AEPB inhale 1 dose by mouth twice a  day  0  . calcium-vitamin D (OSCAL WITH D) 500-200 MG-UNIT per tablet Take 1 tablet by mouth daily with breakfast.     . Leuprolide Acetate (LUPRON IJ) Inject 1 mL as directed every 3 (three) months.    . Multiple Vitamin (MULTIVITAMIN) tablet Take 1 tablet by mouth daily.    . predniSONE (DELTASONE) 5 MG tablet Take 1 tablet (5 mg total) by mouth 2 (two) times daily with a meal. 60 tablet 3  . Vitamin D, Ergocalciferol, (DRISDOL) 50000 UNITS CAPS capsule Take 50,000 Units by mouth once a week. Fridays  0  .  diphenoxylate-atropine (LOMOTIL) 2.5-0.025 MG tablet Take 1 tablet by mouth 4 (four) times daily as needed for diarrhea or loose stools. Take it along with immodium (Patient not taking: Reported on 05/02/2016) 40 tablet 0  . ondansetron (ZOFRAN) 8 MG tablet 1 pill every 8 hours as needed for nausea/vomitting (Patient not taking: Reported on 05/02/2016) 40 tablet 1  . prochlorperazine (COMPAZINE) 10 MG tablet take 1 tablet by mouth every 6 hours if needed for nausea and vomiting  0   No current facility-administered medications for this visit.     PHYSICAL EXAMINATION: ECOG PERFORMANCE STATUS: 0 - Asymptomatic  BP 133/72 (BP Location: Right Arm, Patient Position: Sitting)   Pulse (!) 105   Temp 97.3 F (36.3 C) (Tympanic)   Resp 20   Ht 6' (1.829 m)   Wt 172 lb (78 kg)   BMI 23.33 kg/m   Filed Weights   05/25/16 1034  Weight: 172 lb (78 kg)    GENERAL: Well-nourished well-developed; Alert, no distress and comfortable.   Accompanied by family. EYES: no pallor or icterus OROPHARYNX: no thrush or ulceration; good dentition  NECK: supple, no masses felt LYMPH:  no palpable lymphadenopathy in the cervical, axillary or inguinal regions LUNGS: clear to auscultation and  No wheeze or crackles HEART/CVS: regular rate & rhythm and positive for murmurs; No lower extremity edema ABDOMEN:abdomen soft, non-tender and normal bowel sounds Musculoskeletal:no cyanosis of digits and no clubbing  PSYCH: alert & oriented x 3 with fluent speech NEURO: no focal motor/sensory deficits SKIN:  no rashes or significant lesions  LABORATORY DATA:  I have reviewed the data as listed    Component Value Date/Time   NA 136 05/25/2016 1009   NA 138 08/25/2014 1442   K 4.1 05/25/2016 1009   K 4.1 08/25/2014 1442   CL 106 05/25/2016 1009   CL 106 08/25/2014 1442   CO2 22 05/25/2016 1009   CO2 25 08/25/2014 1442   GLUCOSE 120 (H) 05/25/2016 1009   GLUCOSE 138 (H) 08/25/2014 1442   BUN 15 05/25/2016  1009   BUN 19 08/25/2014 1442   CREATININE 0.50 (L) 05/25/2016 1009   CREATININE 0.53 (L) 08/25/2014 1442   CALCIUM 8.9 05/25/2016 1009   CALCIUM 9.0 08/25/2014 1442   PROT 6.7 05/25/2016 1009   PROT 6.8 08/25/2014 1442   ALBUMIN 3.8 05/25/2016 1009   ALBUMIN 3.9 08/25/2014 1442   AST 23 05/25/2016 1009   AST 18 08/25/2014 1442   ALT 14 (L) 05/25/2016 1009   ALT 13 (L) 08/25/2014 1442   ALKPHOS 312 (H) 05/25/2016 1009   ALKPHOS 73 08/25/2014 1442   BILITOT 0.5 05/25/2016 1009   BILITOT 0.7 08/25/2014 1442   GFRNONAA >60 05/25/2016 1009   GFRNONAA >60 08/25/2014 1442   GFRAA >60 05/25/2016 1009   GFRAA >60 08/25/2014 1442    No results found for: SPEP, UPEP  Lab Results  Component Value Date   WBC 5.1 05/25/2016   NEUTROABS 3.1 05/25/2016   HGB 8.1 (L) 05/25/2016   HCT 24.0 (L) 05/25/2016   MCV 94.0 05/25/2016   PLT 143 (L) 05/25/2016      Chemistry      Component Value Date/Time   NA 136 05/25/2016 1009   NA 138 08/25/2014 1442   K 4.1 05/25/2016 1009   K 4.1 08/25/2014 1442   CL 106 05/25/2016 1009   CL 106 08/25/2014 1442   CO2 22 05/25/2016 1009   CO2 25 08/25/2014 1442   BUN 15 05/25/2016 1009   BUN 19 08/25/2014 1442   CREATININE 0.50 (L) 05/25/2016 1009   CREATININE 0.53 (L) 08/25/2014 1442      Component Value Date/Time   CALCIUM 8.9 05/25/2016 1009   CALCIUM 9.0 08/25/2014 1442   ALKPHOS 312 (H) 05/25/2016 1009   ALKPHOS 73 08/25/2014 1442   AST 23 05/25/2016 1009   AST 18 08/25/2014 1442   ALT 14 (L) 05/25/2016 1009   ALT 13 (L) 08/25/2014 1442   BILITOT 0.5 05/25/2016 1009   BILITOT 0.7 08/25/2014 1442      Results for JACOLBY, BROW (MRN [id number]) as of 05/25/2016 11:09  Ref. Range 01/12/2016 13:00 01/20/2016 08:21 02/20/2016 08:57 03/12/2016 09:32 04/20/2016 10:25  PSA Latest Ref Range: 0.00 - 4.00 ng/mL 268.00 (H) 296.00 (H) 756.00 (H) 1,325.00 (H) 2,886.00 (H)     RADIOGRAPHIC STUDIES: I have personally reviewed the  radiological images as listed and agreed with the findings in the report. No results found.   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 Carboplatin was added to Cabazitaxel with cycle 3.    # proceed with cabazitaxel-carboplatin AUC 5 today. Await PSA from today.    # Severe Anemia- Hb 8.1. Plan PRBC transfusion in appx 10 days/ [pt going out of town]  # Last Lupron in October 1 st week; plan lupron in 10 days.    # Continue monthly X-geva.   # 10days labs/ x-geva/lupron/ hold tube/PRBC transfusion; follow up in 3 weeks/labs/chemo.  Orders Placed This Encounter  Procedures  . PSA    Standing Status:   Future    Number of Occurrences:   1    Standing Expiration Date:   05/25/2017  . CBC with Differential    Standing Status:   Future    Standing Expiration Date:   05/25/2017  . Basic metabolic panel    Standing Status:   Future    Standing Expiration Date:   05/25/2017  . CBC with Differential    Standing Status:   Future    Standing Expiration Date:   05/25/2017  . Comprehensive metabolic panel    Standing Status:   Future    Standing Expiration Date:   05/25/2017  . PSA    Standing Status:   Future    Standing Expiration Date:   05/25/2017  . Hold Tube- Blood Bank    Standing Status:   Future    Standing Expiration Date:   05/25/2017  . Hold Tube- Blood Bank    Standing Status:   Future    Standing Expiration Date:   05/25/2017     Cammie Sickle, MD 05/25/2016 11:11 AM

## 2016-05-25 NOTE — Assessment & Plan Note (Addendum)
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 Carboplatin was added to Cabazitaxel with cycle 3.    # proceed with cabazitaxel-carboplatin AUC 5 today. Await PSA from today.    # Severe Anemia- Hb 8.1. Plan PRBC transfusion in appx 10 days/ [pt going out of town]  # Last Lupron in October 1 st week; plan lupron in 10 days.    # Continue monthly X-geva.   # 10days labs/ x-geva/lupron/ hold tube/PRBC transfusion; follow up in 3 weeks/labs/chemo.

## 2016-05-25 NOTE — Progress Notes (Signed)
Patient reports fatigue 

## 2016-05-29 ENCOUNTER — Telehealth: Payer: Self-pay | Admitting: *Deleted

## 2016-05-29 NOTE — Telephone Encounter (Signed)
-----   Message from Cammie Sickle, MD sent at 05/28/2016 10:04 AM EST ----- Please inform pt's wife that PSA is slightly better/improving; continue current plan of care. Dr.B

## 2016-05-29 NOTE — Telephone Encounter (Signed)
Spoke with patient's wife regarding patient's psa results. She thanked me for calling her for results.

## 2016-06-04 ENCOUNTER — Inpatient Hospital Stay: Payer: Medicare Other

## 2016-06-04 ENCOUNTER — Other Ambulatory Visit: Payer: Self-pay | Admitting: Internal Medicine

## 2016-06-04 ENCOUNTER — Other Ambulatory Visit: Payer: Self-pay | Admitting: *Deleted

## 2016-06-04 DIAGNOSIS — D6481 Anemia due to antineoplastic chemotherapy: Secondary | ICD-10-CM | POA: Insufficient documentation

## 2016-06-04 DIAGNOSIS — T451X5A Adverse effect of antineoplastic and immunosuppressive drugs, initial encounter: Principal | ICD-10-CM | POA: Insufficient documentation

## 2016-06-04 DIAGNOSIS — C61 Malignant neoplasm of prostate: Secondary | ICD-10-CM

## 2016-06-04 DIAGNOSIS — C7951 Secondary malignant neoplasm of bone: Secondary | ICD-10-CM

## 2016-06-04 LAB — CBC WITH DIFFERENTIAL/PLATELET
BASOS PCT: 1 %
Basophils Absolute: 0 10*3/uL (ref 0–0.1)
EOS ABS: 0 10*3/uL (ref 0–0.7)
Eosinophils Relative: 0 %
HCT: 23.3 % — ABNORMAL LOW (ref 40.0–52.0)
HEMOGLOBIN: 7.9 g/dL — AB (ref 13.0–18.0)
Lymphocytes Relative: 21 %
Lymphs Abs: 1.3 10*3/uL (ref 1.0–3.6)
MCH: 32.2 pg (ref 26.0–34.0)
MCHC: 33.9 g/dL (ref 32.0–36.0)
MCV: 94.8 fL (ref 80.0–100.0)
Monocytes Absolute: 0.3 10*3/uL (ref 0.2–1.0)
Monocytes Relative: 5 %
NEUTROS PCT: 73 %
Neutro Abs: 4.7 10*3/uL (ref 1.4–6.5)
PLATELETS: 119 10*3/uL — AB (ref 150–440)
RBC: 2.46 MIL/uL — AB (ref 4.40–5.90)
RDW: 19.5 % — ABNORMAL HIGH (ref 11.5–14.5)
WBC: 6.4 10*3/uL (ref 3.8–10.6)

## 2016-06-04 LAB — BASIC METABOLIC PANEL
Anion gap: 9 (ref 5–15)
BUN: 13 mg/dL (ref 6–20)
CHLORIDE: 106 mmol/L (ref 101–111)
CO2: 23 mmol/L (ref 22–32)
CREATININE: 0.46 mg/dL — AB (ref 0.61–1.24)
Calcium: 8.4 mg/dL — ABNORMAL LOW (ref 8.9–10.3)
GFR calc Af Amer: >60 mL/min (ref >60)
Glucose, Bld: 104 mg/dL — ABNORMAL HIGH (ref 65–99)
POTASSIUM: 4 mmol/L (ref 3.5–5.1)
SODIUM: 138 mmol/L (ref 135–145)

## 2016-06-04 LAB — SAMPLE TO BLOOD BANK

## 2016-06-04 LAB — ABO/RH: ABO/RH(D): O POS

## 2016-06-04 LAB — PREPARE RBC (CROSSMATCH)

## 2016-06-04 MED ORDER — DENOSUMAB 120 MG/1.7ML ~~LOC~~ SOLN
120.0000 mg | Freq: Once | SUBCUTANEOUS | Status: AC
  Administered 2016-06-04: 120 mg via SUBCUTANEOUS
  Filled 2016-06-04: qty 1.7

## 2016-06-04 NOTE — Progress Notes (Signed)
Ca =8.4.  MD ok to give xgeva today.

## 2016-06-05 ENCOUNTER — Inpatient Hospital Stay: Payer: Medicare Other

## 2016-06-05 VITALS — BP 125/60 | HR 78 | Temp 96.7°F | Resp 18

## 2016-06-05 DIAGNOSIS — T451X5A Adverse effect of antineoplastic and immunosuppressive drugs, initial encounter: Principal | ICD-10-CM

## 2016-06-05 DIAGNOSIS — C7951 Secondary malignant neoplasm of bone: Secondary | ICD-10-CM

## 2016-06-05 DIAGNOSIS — C61 Malignant neoplasm of prostate: Secondary | ICD-10-CM | POA: Diagnosis not present

## 2016-06-05 DIAGNOSIS — D6481 Anemia due to antineoplastic chemotherapy: Secondary | ICD-10-CM

## 2016-06-05 MED ORDER — LEUPROLIDE ACETATE (3 MONTH) 22.5 MG IM KIT
22.5000 mg | PACK | Freq: Once | INTRAMUSCULAR | Status: AC
  Administered 2016-06-05: 22.5 mg via INTRAMUSCULAR
  Filled 2016-06-05: qty 22.5

## 2016-06-05 MED ORDER — FUROSEMIDE 10 MG/ML IJ SOLN
20.0000 mg | Freq: Once | INTRAMUSCULAR | Status: AC
  Administered 2016-06-05: 20 mg via INTRAVENOUS
  Filled 2016-06-05: qty 2

## 2016-06-05 MED ORDER — ACETAMINOPHEN 325 MG PO TABS
650.0000 mg | ORAL_TABLET | Freq: Once | ORAL | Status: AC
  Administered 2016-06-05: 650 mg via ORAL
  Filled 2016-06-05: qty 2

## 2016-06-05 MED ORDER — DIPHENHYDRAMINE HCL 25 MG PO CAPS
25.0000 mg | ORAL_CAPSULE | Freq: Once | ORAL | Status: AC
  Administered 2016-06-05: 25 mg via ORAL
  Filled 2016-06-05: qty 1

## 2016-06-05 MED ORDER — SODIUM CHLORIDE 0.9 % IV SOLN
250.0000 mL | Freq: Once | INTRAVENOUS | Status: AC
  Administered 2016-06-05: 250 mL via INTRAVENOUS
  Filled 2016-06-05: qty 250

## 2016-06-06 LAB — TYPE AND SCREEN
ABO/RH(D): O POS
Antibody Screen: NEGATIVE
UNIT DIVISION: 0

## 2016-06-13 ENCOUNTER — Telehealth: Payer: Self-pay | Admitting: Internal Medicine

## 2016-06-13 NOTE — Telephone Encounter (Signed)
will check [employer] testing; on feb 9th when pt comes for chemo/lab. eas

## 2016-06-14 ENCOUNTER — Encounter: Payer: Self-pay | Admitting: *Deleted

## 2016-06-15 ENCOUNTER — Inpatient Hospital Stay: Payer: Medicare Other

## 2016-06-15 ENCOUNTER — Encounter: Payer: Self-pay | Admitting: Internal Medicine

## 2016-06-15 ENCOUNTER — Inpatient Hospital Stay: Payer: Medicare Other | Attending: Internal Medicine | Admitting: Internal Medicine

## 2016-06-15 VITALS — BP 122/65 | HR 93 | Temp 97.8°F | Resp 18 | Ht 72.0 in | Wt 174.8 lb

## 2016-06-15 DIAGNOSIS — Z8042 Family history of malignant neoplasm of prostate: Secondary | ICD-10-CM | POA: Diagnosis not present

## 2016-06-15 DIAGNOSIS — Z923 Personal history of irradiation: Secondary | ICD-10-CM | POA: Insufficient documentation

## 2016-06-15 DIAGNOSIS — Z7952 Long term (current) use of systemic steroids: Secondary | ICD-10-CM | POA: Diagnosis not present

## 2016-06-15 DIAGNOSIS — F17210 Nicotine dependence, cigarettes, uncomplicated: Secondary | ICD-10-CM | POA: Diagnosis not present

## 2016-06-15 DIAGNOSIS — Z79818 Long term (current) use of other agents affecting estrogen receptors and estrogen levels: Secondary | ICD-10-CM

## 2016-06-15 DIAGNOSIS — C61 Malignant neoplasm of prostate: Secondary | ICD-10-CM | POA: Insufficient documentation

## 2016-06-15 DIAGNOSIS — J45909 Unspecified asthma, uncomplicated: Secondary | ICD-10-CM | POA: Insufficient documentation

## 2016-06-15 DIAGNOSIS — Z79899 Other long term (current) drug therapy: Secondary | ICD-10-CM | POA: Diagnosis not present

## 2016-06-15 DIAGNOSIS — R011 Cardiac murmur, unspecified: Secondary | ICD-10-CM | POA: Diagnosis not present

## 2016-06-15 DIAGNOSIS — Z800 Family history of malignant neoplasm of digestive organs: Secondary | ICD-10-CM | POA: Diagnosis not present

## 2016-06-15 DIAGNOSIS — M869 Osteomyelitis, unspecified: Secondary | ICD-10-CM | POA: Diagnosis not present

## 2016-06-15 DIAGNOSIS — R0602 Shortness of breath: Secondary | ICD-10-CM

## 2016-06-15 DIAGNOSIS — C7951 Secondary malignant neoplasm of bone: Secondary | ICD-10-CM | POA: Insufficient documentation

## 2016-06-15 DIAGNOSIS — E785 Hyperlipidemia, unspecified: Secondary | ICD-10-CM | POA: Insufficient documentation

## 2016-06-15 DIAGNOSIS — Z5111 Encounter for antineoplastic chemotherapy: Secondary | ICD-10-CM | POA: Diagnosis not present

## 2016-06-15 LAB — COMPREHENSIVE METABOLIC PANEL
ALK PHOS: 349 U/L — AB (ref 38–126)
ALT: 14 U/L — ABNORMAL LOW (ref 17–63)
ANION GAP: 8 (ref 5–15)
AST: 25 U/L (ref 15–41)
Albumin: 3.9 g/dL (ref 3.5–5.0)
BILIRUBIN TOTAL: 0.6 mg/dL (ref 0.3–1.2)
BUN: 12 mg/dL (ref 6–20)
CALCIUM: 8.6 mg/dL — AB (ref 8.9–10.3)
CO2: 23 mmol/L (ref 22–32)
Chloride: 106 mmol/L (ref 101–111)
Creatinine, Ser: 0.46 mg/dL — ABNORMAL LOW (ref 0.61–1.24)
Glucose, Bld: 123 mg/dL — ABNORMAL HIGH (ref 65–99)
POTASSIUM: 4 mmol/L (ref 3.5–5.1)
Sodium: 137 mmol/L (ref 135–145)
TOTAL PROTEIN: 6.8 g/dL (ref 6.5–8.1)

## 2016-06-15 LAB — CBC WITH DIFFERENTIAL/PLATELET
Basophils Absolute: 0 10*3/uL (ref 0–0.1)
Basophils Relative: 0 %
EOS PCT: 0 %
Eosinophils Absolute: 0 10*3/uL (ref 0–0.7)
HEMATOCRIT: 26.7 % — AB (ref 40.0–52.0)
Hemoglobin: 9 g/dL — ABNORMAL LOW (ref 13.0–18.0)
LYMPHS ABS: 1.3 10*3/uL (ref 1.0–3.6)
LYMPHS PCT: 25 %
MCH: 31 pg (ref 26.0–34.0)
MCHC: 33.6 g/dL (ref 32.0–36.0)
MCV: 92.3 fL (ref 80.0–100.0)
MONO ABS: 0.4 10*3/uL (ref 0.2–1.0)
Monocytes Relative: 7 %
NEUTROS ABS: 3.6 10*3/uL (ref 1.4–6.5)
Neutrophils Relative %: 68 %
PLATELETS: 127 10*3/uL — AB (ref 150–440)
RBC: 2.9 MIL/uL — ABNORMAL LOW (ref 4.40–5.90)
RDW: 21.7 % — ABNORMAL HIGH (ref 11.5–14.5)
WBC: 5.3 10*3/uL (ref 3.8–10.6)

## 2016-06-15 LAB — SAMPLE TO BLOOD BANK

## 2016-06-15 LAB — PSA: PSA: 2328 ng/mL — AB (ref 0.00–4.00)

## 2016-06-15 MED ORDER — PEGFILGRASTIM 6 MG/0.6ML ~~LOC~~ PSKT
6.0000 mg | PREFILLED_SYRINGE | Freq: Once | SUBCUTANEOUS | Status: AC
  Administered 2016-06-15: 6 mg via SUBCUTANEOUS
  Filled 2016-06-15: qty 0.6

## 2016-06-15 MED ORDER — FAMOTIDINE IN NACL 20-0.9 MG/50ML-% IV SOLN
20.0000 mg | Freq: Once | INTRAVENOUS | Status: AC
  Administered 2016-06-15: 20 mg via INTRAVENOUS
  Filled 2016-06-15: qty 50

## 2016-06-15 MED ORDER — PALONOSETRON HCL INJECTION 0.25 MG/5ML
0.2500 mg | Freq: Once | INTRAVENOUS | Status: AC
  Administered 2016-06-15: 0.25 mg via INTRAVENOUS
  Filled 2016-06-15: qty 5

## 2016-06-15 MED ORDER — SODIUM CHLORIDE 0.9 % IV SOLN
Freq: Once | INTRAVENOUS | Status: AC
Start: 2016-06-15 — End: 2016-06-15
  Administered 2016-06-15: 11:00:00 via INTRAVENOUS
  Filled 2016-06-15: qty 1000

## 2016-06-15 MED ORDER — DIPHENHYDRAMINE HCL 50 MG/ML IJ SOLN
25.0000 mg | Freq: Once | INTRAMUSCULAR | Status: AC
  Administered 2016-06-15: 25 mg via INTRAVENOUS
  Filled 2016-06-15: qty 1

## 2016-06-15 MED ORDER — SODIUM CHLORIDE 0.9 % IV SOLN: 10.0000 mg | Freq: Once | INTRAVENOUS | Status: DC

## 2016-06-15 MED ORDER — SODIUM CHLORIDE 0.9 % IV SOLN
460.0000 mg | Freq: Once | INTRAVENOUS | Status: AC
  Administered 2016-06-15: 460 mg via INTRAVENOUS
  Filled 2016-06-15: qty 46

## 2016-06-15 MED ORDER — CABAZITAXEL CHEMO INJECTION 60 MG/6ML W/DILUENT
20.0000 mg/m2 | Freq: Once | INTRAVENOUS | Status: AC
  Administered 2016-06-15: 40 mg via INTRAVENOUS
  Filled 2016-06-15: qty 4

## 2016-06-15 MED ORDER — DEXAMETHASONE SODIUM PHOSPHATE 10 MG/ML IJ SOLN
10.0000 mg | Freq: Once | INTRAMUSCULAR | Status: AC
  Administered 2016-06-15: 10 mg via INTRAVENOUS
  Filled 2016-06-15: qty 1

## 2016-06-15 NOTE — Telephone Encounter (Signed)
Drawn on 06/15/16

## 2016-06-15 NOTE — Progress Notes (Signed)
Patient here in follow-up with Dr. Rogue Bussing for prostate cancer. He is here for his next chemotherapy-carbazitaxel and carboplatin. He states that his energy is improved. He denies any N&V&D, shortness of breath, chest pain or mouth sores. He states that he went for 2nd opinion apt at [facility]. Wife has multiple questions regarding the treatment plan and specifically when the next ct scan should be scheduled. She states that [facility] made her husband a f/u apt at the end of Feb. 2018 to further discuss his next ct scan results.

## 2016-06-15 NOTE — Progress Notes (Signed)
[place] OFFICE PROGRESS NOTE  Patient Care Team: Lenard Simmer, MD as PCP - General (Endocrinology)  Cancer Staging No matching staging information was found for the patient.   Oncology History   1. Carcinoma of prostate diagnosis in 2006.  Had received radiation therapy, external beam to prostate and pelvic lymph nodes.  Had Gleason 9 (4+5.)  PainBaseline PSA was 13. 2. Received Lupron injection after her radiation therapy also.  PSA was going up.  Last year or so received Lupron injection in September but did not have any followup.   3. Recently (this August, 2012) PSA was found to be 17. Patient was started on Lupron from September of 2012. 4. Patient has progressive disease by PSA criteria (October, 2013) 5. Patient was started on Zytiga and prednisone in November of 2013. 6.traumatic hip fracture ((right femur) status post internally fixed ,no evidence of metastatic disease(May of 2014) 7.bone scan in July of 2014 at outside institutions shows metastases to left scapula and left 11th rib/.. 8.progressive On ZYTIGA by PSA criteria.  9[name], September of 2015- Progression- Jan 2016  # AUG 11th 2017- Provenge 2 of planned 3 [sec to logistical issues]  # SEP 2017- START DOCETAXEL weekly; CT C/A/P-Progressive bone mets [compared to 2016 CT]PSA PROGRESSION; NOV 2017BONE SCAN STABLE  # NOV 21st 2017-  CABAZITAXEL q 3 W; 05/02/2016-Added Carboplatin with cycle #3 [given rising PSA]     Prostate cancer metastatic to bone ([place])   10/19/2015 Initial Diagnosis    Prostate cancer metastatic to bone ([place])        INTERVAL HISTORY:  Preston Wilson 81 y.o.  male pleasant patient above history of Metastatic prostate cancer status post multiple lines of therapy Currently cabazitaxel- [added carboplatin AUC 4 with cycle #3] Status post cycle #4 is here for follow-up.  Interim patient had a second opinion at [facility]; reviewed the records.   Patient received  blood transfusion approximately 10 days ago; his energy level is improved. He currently feels well.  He continues to deny any pain. Denies any fevers or chills. No cough. No swelling of the legs. Denies any tingling or numbness. No sores in the mouth. No diarrhea.  Continues to feel well overall.   REVIEW OF SYSTEMS:  A complete 10 point review of system is done which is negative except mentioned above/history of present illness.   PAST MEDICAL HISTORY :  Past Medical History:  Diagnosis Date  . Asthma   . Heart murmur   . Hyperlipidemia   . Hypocalcemia   . Osteomyelitis ([place])   . Prostate cancer ([place])   . SOB (shortness of breath)     PAST SURGICAL HISTORY :   Past Surgical History:  Procedure Laterality Date  . COLONOSCOPY WITH PROPOFOL N/A 04/12/2015   Procedure: COLONOSCOPY WITH PROPOFOL;  Surgeon: Lollie Sails, MD;  Location: [facility] ENDOSCOPY;  Service: Endoscopy;  Laterality: N/A;  . EXCISIONAL HEMORRHOIDECTOMY    . TONSILLECTOMY      FAMILY HISTORY :   Family History  Problem Relation Age of Onset  . Colon cancer Father     deceased  . Prostate cancer Brother     deceased  . Other Child     neuroblastoma    SOCIAL HISTORY:   Social History  Substance Use Topics  . Smoking status: Current Every Day Smoker    Packs/day: 0.50    Types: Cigarettes  . Smokeless tobacco: Never Used  . Alcohol use No  ALLERGIES:  has No Known Allergies.  MEDICATIONS:  Current Outpatient Prescriptions  Medication Sig Dispense Refill  . ADVAIR DISKUS 100-50 MCG/DOSE AEPB inhale 1 dose by mouth twice a day  0  . calcium-vitamin D (OSCAL WITH D) 500-200 MG-UNIT per tablet Take 1 tablet by mouth daily with breakfast.     . Leuprolide Acetate (LUPRON IJ) Inject 1 mL as directed every 3 (three) months.    . Multiple Vitamin (MULTIVITAMIN) tablet Take 1 tablet by mouth daily.    . predniSONE (DELTASONE) 5 MG tablet Take 1 tablet (5 mg total) by mouth 2 (two) times daily with a  meal. 60 tablet 3  . Vitamin D, Ergocalciferol, (DRISDOL) 50000 UNITS CAPS capsule Take 50,000 Units by mouth once a week. Fridays  0  . diphenoxylate-atropine (LOMOTIL) 2.5-0.025 MG tablet Take 1 tablet by mouth 4 (four) times daily as needed for diarrhea or loose stools. Take it along with immodium (Patient not taking: Reported on 05/02/2016) 40 tablet 0  . ondansetron (ZOFRAN) 8 MG tablet 1 pill every 8 hours as needed for nausea/vomitting (Patient not taking: Reported on 05/02/2016) 40 tablet 1  . pravastatin (PRAVACHOL) 20 MG tablet Take 1 tablet by mouth daily.    . prochlorperazine (COMPAZINE) 10 MG tablet take 1 tablet by mouth every 6 hours if needed for nausea and vomiting  0   No current facility-administered medications for this visit.     PHYSICAL EXAMINATION: ECOG PERFORMANCE STATUS: 0 - Asymptomatic  BP 122/65 (BP Location: Left Arm, Patient Position: Sitting)   Pulse 93   Temp 97.8 F (36.6 C) (Tympanic)   Resp 18   Ht 6' (1.829 m)   Wt 174 lb 12.8 oz (79.3 kg)   BMI 23.71 kg/m   Filed Weights   06/15/16 0943  Weight: 174 lb 12.8 oz (79.3 kg)    GENERAL: Well-nourished well-developed; Alert, no distress and comfortable.   Accompanied by family. EYES: no pallor or icterus OROPHARYNX: no thrush or ulceration; good dentition  NECK: supple, no masses felt LYMPH:  no palpable lymphadenopathy in the cervical, axillary or inguinal regions LUNGS: clear to auscultation and  No wheeze or crackles HEART/CVS: regular rate & rhythm and positive for murmurs; No lower extremity edema ABDOMEN:abdomen soft, non-tender and normal bowel sounds Musculoskeletal:no cyanosis of digits and no clubbing  PSYCH: alert & oriented x 3 with fluent speech NEURO: no focal motor/sensory deficits SKIN:  no rashes or significant lesions  LABORATORY DATA:  I have reviewed the data as listed    Component Value Date/Time   NA 137 06/15/2016 0908   NA 138 08/25/2014 1442   K 4.0 06/15/2016  0908   K 4.1 08/25/2014 1442   CL 106 06/15/2016 0908   CL 106 08/25/2014 1442   CO2 23 06/15/2016 0908   CO2 25 08/25/2014 1442   GLUCOSE 123 (H) 06/15/2016 0908   GLUCOSE 138 (H) 08/25/2014 1442   BUN 12 06/15/2016 0908   BUN 19 08/25/2014 1442   CREATININE 0.46 (L) 06/15/2016 0908   CREATININE 0.53 (L) 08/25/2014 1442   CALCIUM 8.6 (L) 06/15/2016 0908   CALCIUM 9.0 08/25/2014 1442   PROT 6.8 06/15/2016 0908   PROT 6.8 08/25/2014 1442   ALBUMIN 3.9 06/15/2016 0908   ALBUMIN 3.9 08/25/2014 1442   AST 25 06/15/2016 0908   AST 18 08/25/2014 1442   ALT 14 (L) 06/15/2016 0908   ALT 13 (L) 08/25/2014 1442   ALKPHOS 349 (H) 06/15/2016 0908  ALKPHOS 73 08/25/2014 1442   BILITOT 0.6 06/15/2016 0908   BILITOT 0.7 08/25/2014 1442   GFRNONAA >60 06/15/2016 0908   GFRNONAA >60 08/25/2014 1442   GFRAA >60 06/15/2016 0908   GFRAA >60 08/25/2014 1442    No results found for: SPEP, UPEP  Lab Results  Component Value Date   WBC 5.3 06/15/2016   NEUTROABS 3.6 06/15/2016   HGB 9.0 (L) 06/15/2016   HCT 26.7 (L) 06/15/2016   MCV 92.3 06/15/2016   PLT 127 (L) 06/15/2016      Chemistry      Component Value Date/Time   NA 137 06/15/2016 0908   NA 138 08/25/2014 1442   K 4.0 06/15/2016 0908   K 4.1 08/25/2014 1442   CL 106 06/15/2016 0908   CL 106 08/25/2014 1442   CO2 23 06/15/2016 0908   CO2 25 08/25/2014 1442   BUN 12 06/15/2016 0908   BUN 19 08/25/2014 1442   CREATININE 0.46 (L) 06/15/2016 0908   CREATININE 0.53 (L) 08/25/2014 1442      Component Value Date/Time   CALCIUM 8.6 (L) 06/15/2016 0908   CALCIUM 9.0 08/25/2014 1442   ALKPHOS 349 (H) 06/15/2016 0908   ALKPHOS 73 08/25/2014 1442   AST 25 06/15/2016 0908   AST 18 08/25/2014 1442   ALT 14 (L) 06/15/2016 0908   ALT 13 (L) 08/25/2014 1442   BILITOT 0.6 06/15/2016 0908   BILITOT 0.7 08/25/2014 1442     Results for BRYNNER, DORAIS (MRN [id number]) as of 06/15/2016 09:17  Ref. Range 01/20/2016 08:21  02/20/2016 08:57 03/12/2016 09:32 04/20/2016 10:25 05/25/2016 10:09  PSA Latest Ref Range: 0.00 - 4.00 ng/mL 296.00 (H) 756.00 (H) 1,325.00 (H) 2,886.00 (H) 2,409.00 (H)      RADIOGRAPHIC STUDIES: I have personally reviewed the radiological images as listed and agreed with the findings in the report. No results found.   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 Carboplatin was added to Cabazitaxel with cycle 3. PSA response noted- as above.    # proceed with  #5 cycle cabazitaxel-carboplatin AUC 5 today. We'll plan to get CT scan of chest and pelvis with contrast also bone scan for reevaluation. Reviewed the note from [place]; appreciate the evaluation. appt at [facility] on 2/27th.   # will also get [employer] testing on blood today.   # Last Lupron q 88M-  jan 30th 2018.   # 10days labs/lupron/ hold tube/PRBC transfusion; follow up in 3 weeks/labs/chemo/ x-geva.; CT scans prior in 2 weeks.   Orders Placed This Encounter  Procedures  . CT ABDOMEN PELVIS W CONTRAST    Standing Status:   Future    Standing Expiration Date:   09/14/2017    Order Specific Question:   Reason for Exam (SYMPTOM  OR DIAGNOSIS REQUIRED)    Answer:   prostate cancer    Order Specific Question:   Preferred imaging location?    Answer:   [facility]  . CT CHEST W CONTRAST    Standing Status:   Future    Standing Expiration Date:   08/15/2017    Order Specific Question:   Reason for Exam (SYMPTOM  OR DIAGNOSIS REQUIRED)    Answer:   prostate cancer    Order Specific Question:   Preferred imaging location?    Answer:   [facility]    Standing Status:  Future    Standing Expiration Date:   08/15/2017    Order Specific Question:   Reason for Exam (SYMPTOM  OR DIAGNOSIS REQUIRED)    Answer:   prostate cancer    Order Specific Question:   Preferred imaging  location?    Answer:   [facility]  . CBC with Differential    Standing Status:   Future    Standing Expiration Date:   06/15/2017  . Comprehensive metabolic panel    Standing Status:   Future    Standing Expiration Date:   06/15/2017  . PSA    Standing Status:   Future    Standing Expiration Date:   06/15/2017  . CBC with Differential    Standing Status:   Future    Standing Expiration Date:   06/15/2017  . Basic metabolic panel    Standing Status:   Future    Standing Expiration Date:   06/15/2017  . Hold Tube- Blood Bank    Standing Status:   Future    Standing Expiration Date:   06/15/2017  . Hold Tube- Blood Bank    Standing Status:   Future    Standing Expiration Date:   06/15/2017     Cammie Sickle, MD 06/15/2016 4:58 PM

## 2016-06-15 NOTE — Assessment & Plan Note (Addendum)
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 Carboplatin was added to Cabazitaxel with cycle 3. PSA response noted- as above.    # proceed with  #5 cycle cabazitaxel-carboplatin AUC 5 today. We'll plan to get CT scan of chest and pelvis with contrast also bone scan for reevaluation. Reviewed the note from [place]; appreciate the evaluation. appt at [facility] on 2/27th.   # will also get [employer] testing on blood today.   # Last Lupron q 21M-  jan 30th 2018.   # 10days labs/lupron/ hold tube/PRBC transfusion; follow up in 3 weeks/labs/chemo/ x-geva.; CT scans prior in 2 weeks.

## 2016-06-19 ENCOUNTER — Telehealth: Payer: Self-pay | Admitting: *Deleted

## 2016-06-19 NOTE — Telephone Encounter (Signed)
Patient contacted and reviewed results of psa level with patient. Pt state that he already called this morning and spoke to a nurse and received his results.

## 2016-06-25 ENCOUNTER — Inpatient Hospital Stay: Payer: Medicare Other

## 2016-06-25 DIAGNOSIS — C61 Malignant neoplasm of prostate: Secondary | ICD-10-CM

## 2016-06-25 DIAGNOSIS — C7951 Secondary malignant neoplasm of bone: Principal | ICD-10-CM

## 2016-06-25 LAB — CBC WITH DIFFERENTIAL/PLATELET
BASOS PCT: 1 %
Basophils Absolute: 0 10*3/uL (ref 0–0.1)
EOS ABS: 0 10*3/uL (ref 0–0.7)
EOS PCT: 0 %
HCT: 25.7 % — ABNORMAL LOW (ref 40.0–52.0)
HEMOGLOBIN: 8.7 g/dL — AB (ref 13.0–18.0)
Lymphocytes Relative: 29 %
Lymphs Abs: 1.9 10*3/uL (ref 1.0–3.6)
MCH: 31.6 pg (ref 26.0–34.0)
MCHC: 33.8 g/dL (ref 32.0–36.0)
MCV: 93.4 fL (ref 80.0–100.0)
Monocytes Absolute: 0.3 10*3/uL (ref 0.2–1.0)
Monocytes Relative: 5 %
NEUTROS PCT: 65 %
Neutro Abs: 4.1 10*3/uL (ref 1.4–6.5)
PLATELETS: 105 10*3/uL — AB (ref 150–440)
RBC: 2.76 MIL/uL — AB (ref 4.40–5.90)
RDW: 22.2 % — ABNORMAL HIGH (ref 11.5–14.5)
WBC: 6.3 10*3/uL (ref 3.8–10.6)

## 2016-06-25 LAB — BASIC METABOLIC PANEL
Anion gap: 9 (ref 5–15)
BUN: 13 mg/dL (ref 6–20)
CHLORIDE: 106 mmol/L (ref 101–111)
CO2: 22 mmol/L (ref 22–32)
CREATININE: 0.46 mg/dL — AB (ref 0.61–1.24)
Calcium: 8.7 mg/dL — ABNORMAL LOW (ref 8.9–10.3)
Glucose, Bld: 101 mg/dL — ABNORMAL HIGH (ref 65–99)
POTASSIUM: 4.3 mmol/L (ref 3.5–5.1)
SODIUM: 137 mmol/L (ref 135–145)

## 2016-06-25 LAB — SAMPLE TO BLOOD BANK

## 2016-06-25 NOTE — Progress Notes (Unsigned)
Hgb: 8.7. MD, Dr. Rogue Bussing, notified via telephone. Per MD order: no blood transfusion today. Patient may be discharged to home.

## 2016-06-29 ENCOUNTER — Encounter
Admission: RE | Admit: 2016-06-29 | Discharge: 2016-06-29 | Disposition: A | Discharge status: Home / Self Care | Payer: Medicare Other | Source: Ambulatory Visit | Attending: Internal Medicine | Admitting: Internal Medicine

## 2016-06-29 ENCOUNTER — Ambulatory Visit
Admission: RE | Admit: 2016-06-29 | Discharge: 2016-06-29 | Disposition: A | Discharge status: Home / Self Care | Payer: Medicare Other | Source: Ambulatory Visit | Attending: Internal Medicine | Admitting: Internal Medicine

## 2016-06-29 DIAGNOSIS — I2510 Atherosclerotic heart disease of native coronary artery without angina pectoris: Secondary | ICD-10-CM | POA: Insufficient documentation

## 2016-06-29 DIAGNOSIS — I700 Atherosclerosis of aorta: Secondary | ICD-10-CM | POA: Insufficient documentation

## 2016-06-29 DIAGNOSIS — R918 Other nonspecific abnormal finding of lung field: Secondary | ICD-10-CM

## 2016-06-29 DIAGNOSIS — C7951 Secondary malignant neoplasm of bone: Secondary | ICD-10-CM | POA: Insufficient documentation

## 2016-06-29 DIAGNOSIS — K8020 Calculus of gallbladder without cholecystitis without obstruction: Secondary | ICD-10-CM | POA: Insufficient documentation

## 2016-06-29 DIAGNOSIS — C61 Malignant neoplasm of prostate: Secondary | ICD-10-CM

## 2016-06-29 MED ORDER — IOPAMIDOL (ISOVUE-300) INJECTION 61%
100.0000 mL | Freq: Once | INTRAVENOUS | Status: AC | PRN
  Administered 2016-06-29: 100 mL via INTRAVENOUS

## 2016-06-29 MED ORDER — TECHNETIUM TC 99M MEDRONATE IV KIT
25.0000 | PACK | Freq: Once | INTRAVENOUS | Status: AC | PRN
  Administered 2016-06-29: 22.073 via INTRAVENOUS

## 2016-07-04 ENCOUNTER — Inpatient Hospital Stay: Payer: Medicare Other

## 2016-07-04 DIAGNOSIS — C7951 Secondary malignant neoplasm of bone: Principal | ICD-10-CM

## 2016-07-04 DIAGNOSIS — C61 Malignant neoplasm of prostate: Secondary | ICD-10-CM | POA: Diagnosis not present

## 2016-07-04 LAB — CBC WITH DIFFERENTIAL/PLATELET
BASOS PCT: 0 %
Basophils Absolute: 0 10*3/uL (ref 0–0.1)
Eosinophils Absolute: 0 10*3/uL (ref 0–0.7)
Eosinophils Relative: 0 %
HEMATOCRIT: 26.3 % — AB (ref 40.0–52.0)
Hemoglobin: 9 g/dL — ABNORMAL LOW (ref 13.0–18.0)
Lymphocytes Relative: 36 %
Lymphs Abs: 1.5 10*3/uL (ref 1.0–3.6)
MCH: 31.9 pg (ref 26.0–34.0)
MCHC: 34.1 g/dL (ref 32.0–36.0)
MCV: 93.5 fL (ref 80.0–100.0)
MONO ABS: 0.4 10*3/uL (ref 0.2–1.0)
Monocytes Relative: 8 %
NEUTROS ABS: 2.4 10*3/uL (ref 1.4–6.5)
NEUTROS PCT: 56 %
Platelets: 132 10*3/uL — ABNORMAL LOW (ref 150–440)
RBC: 2.82 MIL/uL — AB (ref 4.40–5.90)
RDW: 22.3 % — AB (ref 11.5–14.5)
WBC: 4.4 10*3/uL (ref 3.8–10.6)

## 2016-07-04 LAB — COMPREHENSIVE METABOLIC PANEL
ALBUMIN: 4 g/dL (ref 3.5–5.0)
ALT: 14 U/L — ABNORMAL LOW (ref 17–63)
AST: 22 U/L (ref 15–41)
Alkaline Phosphatase: 373 U/L — ABNORMAL HIGH (ref 38–126)
Anion gap: 8 (ref 5–15)
BILIRUBIN TOTAL: 0.7 mg/dL (ref 0.3–1.2)
BUN: 15 mg/dL (ref 6–20)
CHLORIDE: 103 mmol/L (ref 101–111)
CO2: 23 mmol/L (ref 22–32)
Calcium: 8.8 mg/dL — ABNORMAL LOW (ref 8.9–10.3)
Creatinine, Ser: 0.46 mg/dL — ABNORMAL LOW (ref 0.61–1.24)
GFR calc Af Amer: >60 mL/min (ref >60)
GFR calc non Af Amer: >60 mL/min (ref >60)
GLUCOSE: 115 mg/dL — AB (ref 65–99)
POTASSIUM: 4.1 mmol/L (ref 3.5–5.1)
Sodium: 134 mmol/L — ABNORMAL LOW (ref 135–145)
TOTAL PROTEIN: 6.8 g/dL (ref 6.5–8.1)

## 2016-07-04 LAB — PSA: PSA: 2280 ng/mL — ABNORMAL HIGH (ref 0.00–4.00)

## 2016-07-04 LAB — SAMPLE TO BLOOD BANK

## 2016-07-04 MED ORDER — DENOSUMAB 120 MG/1.7ML ~~LOC~~ SOLN
120.0000 mg | Freq: Once | SUBCUTANEOUS | Status: AC
  Administered 2016-07-04: 120 mg via SUBCUTANEOUS
  Filled 2016-07-04: qty 1.7

## 2016-07-06 ENCOUNTER — Inpatient Hospital Stay: Payer: Medicare Other | Attending: Internal Medicine | Admitting: Internal Medicine

## 2016-07-06 ENCOUNTER — Inpatient Hospital Stay: Payer: Medicare Other

## 2016-07-06 VITALS — BP 135/72 | HR 93 | Temp 97.1°F | Resp 18 | Ht 72.0 in | Wt 171.6 lb

## 2016-07-06 DIAGNOSIS — R0602 Shortness of breath: Secondary | ICD-10-CM | POA: Diagnosis not present

## 2016-07-06 DIAGNOSIS — Z79899 Other long term (current) drug therapy: Secondary | ICD-10-CM | POA: Diagnosis not present

## 2016-07-06 DIAGNOSIS — R011 Cardiac murmur, unspecified: Secondary | ICD-10-CM | POA: Insufficient documentation

## 2016-07-06 DIAGNOSIS — D6481 Anemia due to antineoplastic chemotherapy: Secondary | ICD-10-CM | POA: Diagnosis not present

## 2016-07-06 DIAGNOSIS — Z8042 Family history of malignant neoplasm of prostate: Secondary | ICD-10-CM | POA: Diagnosis not present

## 2016-07-06 DIAGNOSIS — Z7952 Long term (current) use of systemic steroids: Secondary | ICD-10-CM

## 2016-07-06 DIAGNOSIS — Z808 Family history of malignant neoplasm of other organs or systems: Secondary | ICD-10-CM | POA: Diagnosis not present

## 2016-07-06 DIAGNOSIS — J45909 Unspecified asthma, uncomplicated: Secondary | ICD-10-CM

## 2016-07-06 DIAGNOSIS — C7951 Secondary malignant neoplasm of bone: Principal | ICD-10-CM

## 2016-07-06 DIAGNOSIS — F17210 Nicotine dependence, cigarettes, uncomplicated: Secondary | ICD-10-CM | POA: Diagnosis not present

## 2016-07-06 DIAGNOSIS — Z7689 Persons encountering health services in other specified circumstances: Secondary | ICD-10-CM | POA: Insufficient documentation

## 2016-07-06 DIAGNOSIS — Z5111 Encounter for antineoplastic chemotherapy: Secondary | ICD-10-CM | POA: Diagnosis not present

## 2016-07-06 DIAGNOSIS — R5383 Other fatigue: Secondary | ICD-10-CM | POA: Insufficient documentation

## 2016-07-06 DIAGNOSIS — Z923 Personal history of irradiation: Secondary | ICD-10-CM | POA: Insufficient documentation

## 2016-07-06 DIAGNOSIS — E785 Hyperlipidemia, unspecified: Secondary | ICD-10-CM

## 2016-07-06 DIAGNOSIS — Z800 Family history of malignant neoplasm of digestive organs: Secondary | ICD-10-CM | POA: Diagnosis not present

## 2016-07-06 DIAGNOSIS — C61 Malignant neoplasm of prostate: Secondary | ICD-10-CM

## 2016-07-06 MED ORDER — PEGFILGRASTIM 6 MG/0.6ML ~~LOC~~ PSKT
6.0000 mg | PREFILLED_SYRINGE | Freq: Once | SUBCUTANEOUS | Status: AC
  Administered 2016-07-06: 6 mg via SUBCUTANEOUS
  Filled 2016-07-06: qty 0.6

## 2016-07-06 MED ORDER — FAMOTIDINE IN NACL 20-0.9 MG/50ML-% IV SOLN
20.0000 mg | Freq: Once | INTRAVENOUS | Status: AC
  Administered 2016-07-06: 20 mg via INTRAVENOUS
  Filled 2016-07-06: qty 50

## 2016-07-06 MED ORDER — CARBOPLATIN CHEMO INJECTION 600 MG/60ML
460.0000 mg | Freq: Once | INTRAVENOUS | Status: AC
  Administered 2016-07-06: 460 mg via INTRAVENOUS
  Filled 2016-07-06: qty 46

## 2016-07-06 MED ORDER — SODIUM CHLORIDE 0.9 % IV SOLN
Freq: Once | INTRAVENOUS | Status: AC
  Administered 2016-07-06: 10:00:00 via INTRAVENOUS
  Filled 2016-07-06: qty 1000

## 2016-07-06 MED ORDER — CABAZITAXEL CHEMO INJECTION 60 MG/6ML W/DILUENT
20.0000 mg/m2 | Freq: Once | INTRAVENOUS | Status: AC
  Administered 2016-07-06: 40 mg via INTRAVENOUS
  Filled 2016-07-06: qty 4

## 2016-07-06 MED ORDER — DIPHENHYDRAMINE HCL 50 MG/ML IJ SOLN
25.0000 mg | Freq: Once | INTRAMUSCULAR | Status: AC
  Administered 2016-07-06: 25 mg via INTRAVENOUS
  Filled 2016-07-06: qty 1

## 2016-07-06 MED ORDER — SODIUM CHLORIDE 0.9 % IV SOLN: 450.0000 mg | Freq: Once | INTRAVENOUS | Status: DC

## 2016-07-06 MED ORDER — PALONOSETRON HCL INJECTION 0.25 MG/5ML
0.2500 mg | Freq: Once | INTRAVENOUS | Status: AC
  Administered 2016-07-06: 0.25 mg via INTRAVENOUS
  Filled 2016-07-06: qty 5

## 2016-07-06 MED ORDER — DEXAMETHASONE SODIUM PHOSPHATE 10 MG/ML IJ SOLN
10.0000 mg | Freq: Once | INTRAMUSCULAR | Status: AC
  Administered 2016-07-06: 10 mg via INTRAVENOUS
  Filled 2016-07-06: qty 1

## 2016-07-06 NOTE — Progress Notes (Signed)
[place] OFFICE PROGRESS NOTE  Patient Care Team: Lenard Simmer, MD as PCP - General (Endocrinology)  Cancer Staging No matching staging information was found for the patient.   Oncology History   1. Carcinoma of prostate diagnosis in 2006.  Had received radiation therapy, external beam to prostate and pelvic lymph nodes.  Had Gleason 9 (4+5.)  PainBaseline PSA was 13. 2. Received Lupron injection after her radiation therapy also.  PSA was going up.  Last year or so received Lupron injection in September but did not have any followup.   3. Recently (this August, 2012) PSA was found to be 17. Patient was started on Lupron from September of 2012. 4. Patient has progressive disease by PSA criteria (October, 2013) 5. Patient was started on Zytiga and prednisone in November of 2013. 6.traumatic hip fracture ((right femur) status post internally fixed ,no evidence of metastatic disease(May of 2014) 7.bone scan in July of 2014 at outside institutions shows metastases to left scapula and left 11th rib/.. 8.progressive On ZYTIGA by PSA criteria.  9[name], September of 2015- Progression- Jan 2016  # AUG 11th 2017- Provenge 2 of planned 3 [sec to logistical issues]  # SEP 2017- START DOCETAXEL weekly; CT C/A/P-Progressive bone mets [compared to 2016 CT]PSA PROGRESSION; NOV 2017BONE SCAN STABLE  # NOV 21st 2017-  CABAZITAXEL q 3 W; 05/02/2016-Added Carboplatin with cycle #3 [given rising PSA]     Prostate cancer metastatic to bone ([place])   10/19/2015 Initial Diagnosis    Prostate cancer metastatic to bone ([place])        INTERVAL HISTORY:  Preston Wilson 81 y.o.  male pleasant patient above history of Metastatic prostate cancer status post multiple lines of therapy Currently cabazitaxel- [added carboplatin AUC 4 with cycle #3] Status post cycle #5 is here for follow-up/ To review the results of the restaging CAT scan and bone scan.  Interim patient had a  follow up at [facility]; reviewed the records.   Patient did not receive any platelet transfusion. Complains of mild shortness of breath on exertion. Complains of mild fatigue which improves the week after chemotherapy. He continues to deny any pain. Denies any fevers or chills. No cough. No swelling of the legs. Denies any tingling or numbness. No sores in the mouth. No diarrhea. Continues to feel well overall. No weight loss.  REVIEW OF SYSTEMS:  A complete 10 point review of system is done which is negative except mentioned above/history of present illness.   PAST MEDICAL HISTORY :  Past Medical History:  Diagnosis Date  . Asthma   . Heart murmur   . Hyperlipidemia   . Hypocalcemia   . Osteomyelitis ([place])   . Prostate cancer ([place])   . SOB (shortness of breath)     PAST SURGICAL HISTORY :   Past Surgical History:  Procedure Laterality Date  . COLONOSCOPY WITH PROPOFOL N/A 04/12/2015   Procedure: COLONOSCOPY WITH PROPOFOL;  Surgeon: Lollie Sails, MD;  Location: [facility] ENDOSCOPY;  Service: Endoscopy;  Laterality: N/A;  . EXCISIONAL HEMORRHOIDECTOMY    . TONSILLECTOMY      FAMILY HISTORY :   Family History  Problem Relation Age of Onset  . Colon cancer Father     deceased  . Prostate cancer Brother     deceased  . Other Child     neuroblastoma    SOCIAL HISTORY:   Social History  Substance Use Topics  . Smoking status: Current Every Day Smoker  Packs/day: 0.50    Types: Cigarettes  . Smokeless tobacco: Never Used  . Alcohol use No    ALLERGIES:  has No Known Allergies.  MEDICATIONS:  Current Outpatient Prescriptions  Medication Sig Dispense Refill  . ADVAIR DISKUS 100-50 MCG/DOSE AEPB inhale 1 dose by mouth twice a day  0  . calcium-vitamin D (OSCAL WITH D) 500-200 MG-UNIT per tablet Take 1 tablet by mouth daily with breakfast.     . Leuprolide Acetate (LUPRON IJ) Inject 1 mL as directed every 3 (three) months.    . Multiple Vitamin (MULTIVITAMIN) tablet Take 1  tablet by mouth daily.    . pravastatin (PRAVACHOL) 20 MG tablet Take 1 tablet by mouth daily.    . predniSONE (DELTASONE) 5 MG tablet Take 1 tablet (5 mg total) by mouth 2 (two) times daily with a meal. 60 tablet 3  . Vitamin D, Ergocalciferol, (DRISDOL) 50000 UNITS CAPS capsule Take 50,000 Units by mouth once a week. Fridays  0  . diphenoxylate-atropine (LOMOTIL) 2.5-0.025 MG tablet Take 1 tablet by mouth 4 (four) times daily as needed for diarrhea or loose stools. Take it along with immodium (Patient not taking: Reported on 05/02/2016) 40 tablet 0  . ondansetron (ZOFRAN) 8 MG tablet 1 pill every 8 hours as needed for nausea/vomitting (Patient not taking: Reported on 05/02/2016) 40 tablet 1  . prochlorperazine (COMPAZINE) 10 MG tablet take 1 tablet by mouth every 6 hours if needed for nausea and vomiting  0   No current facility-administered medications for this visit.    Facility-Administered Medications Ordered in Other Visits  Medication Dose Route Frequency Provider Last Rate Last Dose  . cabazitaxel (JEVTANA) 40 mg in sodium chloride 0.9 % 250 mL chemo infusion  20 mg/m2 (Treatment Plan Recorded) Intravenous Once Cammie Sickle, MD 254 mL/hr at 07/06/16 1053 40 mg at 07/06/16 1053  . CARBOplatin (PARAPLATIN) 460 mg in sodium chloride 0.9 % 250 mL chemo infusion  460 mg Intravenous Once Cammie Sickle, MD      . pegfilgrastim (NEULASTA ONPRO KIT) injection 6 mg  6 mg Subcutaneous Once Cammie Sickle, MD        PHYSICAL EXAMINATION: ECOG PERFORMANCE STATUS: 0 - Asymptomatic  BP 135/72 (BP Location: Left Arm, Patient Position: Sitting)   Pulse 93   Temp 97.1 F (36.2 C) (Tympanic)   Resp 18   Ht 6' (1.829 m)   Wt 171 lb 9.6 oz (77.8 kg) Comment: weight stable  BMI 23.27 kg/m   Filed Weights   07/06/16 0914  Weight: 171 lb 9.6 oz (77.8 kg)    GENERAL: Well-nourished well-developed; Alert, no distress and comfortable.   Accompanied by family. EYES: no pallor or  icterus OROPHARYNX: no thrush or ulceration; good dentition  NECK: Preston Wilson, no masses felt LYMPH:  no palpable lymphadenopathy in the cervical, axillary or inguinal regions LUNGS: clear to auscultation and  No wheeze or crackles HEART/CVS: regular rate & rhythm and positive for murmurs; No lower extremity edema ABDOMEN:abdomen soft, non-tender and normal bowel sounds Musculoskeletal:no cyanosis of digits and no clubbing  PSYCH: alert & oriented x 3 with fluent speech NEURO: no focal motor/sensory deficits SKIN:  no rashes or significant lesions  LABORATORY DATA:  I have reviewed the data as listed    Component Value Date/Time   NA 134 (L) 07/04/2016 0942   NA 138 08/25/2014 1442   K 4.1 07/04/2016 0942   K 4.1 08/25/2014 1442   CL 103 07/04/2016 4174  CL 106 08/25/2014 1442   CO2 23 07/04/2016 0942   CO2 25 08/25/2014 1442   GLUCOSE 115 (H) 07/04/2016 0942   GLUCOSE 138 (H) 08/25/2014 1442   BUN 15 07/04/2016 0942   BUN 19 08/25/2014 1442   CREATININE 0.46 (L) 07/04/2016 0942   CREATININE 0.53 (L) 08/25/2014 1442   CALCIUM 8.8 (L) 07/04/2016 0942   CALCIUM 9.0 08/25/2014 1442   PROT 6.8 07/04/2016 0942   PROT 6.8 08/25/2014 1442   ALBUMIN 4.0 07/04/2016 0942   ALBUMIN 3.9 08/25/2014 1442   AST 22 07/04/2016 0942   AST 18 08/25/2014 1442   ALT 14 (L) 07/04/2016 0942   ALT 13 (L) 08/25/2014 1442   ALKPHOS 373 (H) 07/04/2016 0942   ALKPHOS 73 08/25/2014 1442   BILITOT 0.7 07/04/2016 0942   BILITOT 0.7 08/25/2014 1442   GFRNONAA >60 07/04/2016 0942   GFRNONAA >60 08/25/2014 1442   GFRAA >60 07/04/2016 0942   GFRAA >60 08/25/2014 1442    No results found for: SPEP, UPEP  Lab Results  Component Value Date   WBC 4.4 07/04/2016   NEUTROABS 2.4 07/04/2016   HGB 9.0 (L) 07/04/2016   HCT 26.3 (L) 07/04/2016   MCV 93.5 07/04/2016   PLT 132 (L) 07/04/2016      Chemistry      Component Value Date/Time   NA 134 (L) 07/04/2016 0942   NA 138 08/25/2014 1442   K 4.1  07/04/2016 0942   K 4.1 08/25/2014 1442   CL 103 07/04/2016 0942   CL 106 08/25/2014 1442   CO2 23 07/04/2016 0942   CO2 25 08/25/2014 1442   BUN 15 07/04/2016 0942   BUN 19 08/25/2014 1442   CREATININE 0.46 (L) 07/04/2016 0942   CREATININE 0.53 (L) 08/25/2014 1442      Component Value Date/Time   CALCIUM 8.8 (L) 07/04/2016 0942   CALCIUM 9.0 08/25/2014 1442   ALKPHOS 373 (H) 07/04/2016 0942   ALKPHOS 73 08/25/2014 1442   AST 22 07/04/2016 0942   AST 18 08/25/2014 1442   ALT 14 (L) 07/04/2016 0942   ALT 13 (L) 08/25/2014 1442   BILITOT 0.7 07/04/2016 0942   BILITOT 0.7 08/25/2014 1442     Results for Preston Wilson, Preston Wilson (MRN [id number]) as of 06/15/2016 09:17  Ref. Range 01/20/2016 08:21 02/20/2016 08:57 03/12/2016 09:32 04/20/2016 10:25 05/25/2016 10:09  PSA Latest Ref Range: 0.00 - 4.00 ng/mL 296.00 (H) 756.00 (H) 1,325.00 (H) 2,886.00 (H) 2,409.00 (H)      RADIOGRAPHIC STUDIES: I have personally reviewed the radiological images as listed and agreed with the findings in the report. No results found.   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 Carboplatin was added to Cabazitaxel with cycle 3. PSA response noted- as above/plateaued. After 5 cycles CT scan/bone scan shows stable bone lesions. No evidence of any visceral metastases.   # proceed with  #6 cycle cabazitaxel-carboplatin AUC 5 today. Reviewed the note from [place]; appreciate the evaluation. Labs today reviewed;  acceptable for treatment today- except for-mild anemia/ thrombocytopenia [Hb 9/platelets- 120s]. If counts get worse, would dose reduce chemotherapy  # Unfortunately, [employer] testing- does not show any novel/ targetable mutation- reviewed with patients wife; also will fax to docs at [facility]. I will communicate with Drs at [facility] re: patients care.   # Rosalita Chessman 2/28]; plan x-geva  at visit.   # Last Lupron q 16M-  jan 30th 2018.   # labs-hold tube in 10 days; follow up in 3 weeks/ MD/chemo/PSA.   # I reviewed the blood work- with the patient in detail; also reviewed the imaging independently [as summarized above]; and with the patient in detail.   Orders Placed This Encounter  Procedures  . CBC with Differential    Standing Status:   Future    Standing Expiration Date:   07/06/2017  . Basic metabolic panel    Standing Status:   Future    Standing Expiration Date:   07/06/2017  . CBC with Differential    Standing Status:   Future    Standing Expiration Date:   07/06/2017  . Comprehensive metabolic panel    Standing Status:   Future    Standing Expiration Date:   07/06/2017  . Hold Tube- Blood Bank    Standing Status:   Future    Standing Expiration Date:   07/06/2017     Cammie Sickle, MD 07/06/2016 11:33 AM

## 2016-07-06 NOTE — Assessment & Plan Note (Addendum)
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 Carboplatin was added to Cabazitaxel with cycle 3. PSA response noted- as above/plateaued. After 5 cycles CT scan/bone scan shows stable bone lesions. No evidence of any visceral metastases.   # proceed with  #6 cycle cabazitaxel-carboplatin AUC 5 today. Reviewed the note from [place]; appreciate the evaluation. Labs today reviewed;  acceptable for treatment today- except for-mild anemia/ thrombocytopenia [Hb 9/platelets- 120s]. If counts get worse, would dose reduce chemotherapy  # Unfortunately, [employer] testing- does not show any novel/ targetable mutation- reviewed with patients wife; also will fax to docs at [facility]. I will communicate with Drs at [facility] re: patients care.   # Rosalita Chessman 2/28]; plan x-geva at visit.   # Last Lupron q 40M-  jan 30th 2018.   # labs-hold tube in 10 days; follow up in 3 weeks/ MD/chemo/PSA.   # I reviewed the blood work- with the patient in detail; also reviewed the imaging independently [as summarized above]; and with the patient in detail.

## 2016-07-16 ENCOUNTER — Ambulatory Visit: Payer: Self-pay

## 2016-07-16 ENCOUNTER — Other Ambulatory Visit: Payer: Self-pay

## 2016-07-18 ENCOUNTER — Inpatient Hospital Stay: Payer: Medicare Other

## 2016-07-18 DIAGNOSIS — C7951 Secondary malignant neoplasm of bone: Principal | ICD-10-CM

## 2016-07-18 DIAGNOSIS — C61 Malignant neoplasm of prostate: Secondary | ICD-10-CM

## 2016-07-18 LAB — CBC WITH DIFFERENTIAL/PLATELET
BASOS ABS: 0 10*3/uL (ref 0–0.1)
BASOS PCT: 0 %
EOS PCT: 0 %
Eosinophils Absolute: 0 10*3/uL (ref 0–0.7)
HEMATOCRIT: 25.3 % — AB (ref 40.0–52.0)
Hemoglobin: 8.7 g/dL — ABNORMAL LOW (ref 13.0–18.0)
LYMPHS PCT: 21 %
Lymphs Abs: 1.3 10*3/uL (ref 1.0–3.6)
MCH: 32.3 pg (ref 26.0–34.0)
MCHC: 34.1 g/dL (ref 32.0–36.0)
MCV: 94.6 fL (ref 80.0–100.0)
MONOS PCT: 5 %
Monocytes Absolute: 0.3 10*3/uL (ref 0.2–1.0)
NEUTROS ABS: 4.4 10*3/uL (ref 1.4–6.5)
Neutrophils Relative %: 74 %
PLATELETS: 110 10*3/uL — AB (ref 150–440)
RBC: 2.68 MIL/uL — ABNORMAL LOW (ref 4.40–5.90)
RDW: 22.2 % — AB (ref 11.5–14.5)
WBC: 6 10*3/uL (ref 3.8–10.6)

## 2016-07-18 LAB — COMPREHENSIVE METABOLIC PANEL
ALBUMIN: 4 g/dL (ref 3.5–5.0)
ALK PHOS: 265 U/L — AB (ref 38–126)
ALT: 16 U/L — ABNORMAL LOW (ref 17–63)
ANION GAP: 7 (ref 5–15)
AST: 24 U/L (ref 15–41)
BILIRUBIN TOTAL: 0.4 mg/dL (ref 0.3–1.2)
BUN: 14 mg/dL (ref 6–20)
CALCIUM: 8.5 mg/dL — AB (ref 8.9–10.3)
CO2: 23 mmol/L (ref 22–32)
Chloride: 105 mmol/L (ref 101–111)
Creatinine, Ser: 0.55 mg/dL — ABNORMAL LOW (ref 0.61–1.24)
GFR calc Af Amer: >60 mL/min (ref >60)
GFR calc non Af Amer: >60 mL/min (ref >60)
GLUCOSE: 133 mg/dL — AB (ref 65–99)
Potassium: 4.1 mmol/L (ref 3.5–5.1)
SODIUM: 135 mmol/L (ref 135–145)
TOTAL PROTEIN: 6.9 g/dL (ref 6.5–8.1)

## 2016-07-18 LAB — SAMPLE TO BLOOD BANK

## 2016-07-25 ENCOUNTER — Inpatient Hospital Stay: Payer: Medicare Other

## 2016-07-25 DIAGNOSIS — C61 Malignant neoplasm of prostate: Secondary | ICD-10-CM | POA: Diagnosis not present

## 2016-07-25 DIAGNOSIS — C7951 Secondary malignant neoplasm of bone: Principal | ICD-10-CM

## 2016-07-25 LAB — CBC WITH DIFFERENTIAL/PLATELET
BASOS ABS: 0 10*3/uL (ref 0–0.1)
Basophils Relative: 0 %
EOS PCT: 0 %
Eosinophils Absolute: 0 10*3/uL (ref 0–0.7)
HCT: 26.3 % — ABNORMAL LOW (ref 40.0–52.0)
HEMOGLOBIN: 8.9 g/dL — AB (ref 13.0–18.0)
LYMPHS ABS: 1.9 10*3/uL (ref 1.0–3.6)
LYMPHS PCT: 34 %
MCH: 32.4 pg (ref 26.0–34.0)
MCHC: 33.8 g/dL (ref 32.0–36.0)
MCV: 95.8 fL (ref 80.0–100.0)
Monocytes Absolute: 0.6 10*3/uL (ref 0.2–1.0)
Monocytes Relative: 10 %
NEUTROS ABS: 3.1 10*3/uL (ref 1.4–6.5)
NEUTROS PCT: 56 %
PLATELETS: 144 10*3/uL — AB (ref 150–440)
RBC: 2.74 MIL/uL — AB (ref 4.40–5.90)
RDW: 22.6 % — ABNORMAL HIGH (ref 11.5–14.5)
WBC: 5.5 10*3/uL (ref 3.8–10.6)

## 2016-07-25 LAB — COMPREHENSIVE METABOLIC PANEL
ALK PHOS: 273 U/L — AB (ref 38–126)
ALT: 15 U/L — AB (ref 17–63)
AST: 25 U/L (ref 15–41)
Albumin: 4.1 g/dL (ref 3.5–5.0)
Anion gap: 7 (ref 5–15)
BUN: 15 mg/dL (ref 6–20)
CALCIUM: 8.8 mg/dL — AB (ref 8.9–10.3)
CO2: 23 mmol/L (ref 22–32)
CREATININE: 0.54 mg/dL — AB (ref 0.61–1.24)
Chloride: 105 mmol/L (ref 101–111)
GFR calc Af Amer: >60 mL/min (ref >60)
Glucose, Bld: 109 mg/dL — ABNORMAL HIGH (ref 65–99)
Potassium: 4 mmol/L (ref 3.5–5.1)
Sodium: 135 mmol/L (ref 135–145)
Total Bilirubin: 0.5 mg/dL (ref 0.3–1.2)
Total Protein: 7.1 g/dL (ref 6.5–8.1)

## 2016-07-25 LAB — PSA: PSA: 2472 ng/mL — AB (ref 0.00–4.00)

## 2016-07-27 ENCOUNTER — Inpatient Hospital Stay (HOSPITAL_BASED_OUTPATIENT_CLINIC_OR_DEPARTMENT_OTHER): Payer: Medicare Other | Admitting: Internal Medicine

## 2016-07-27 ENCOUNTER — Inpatient Hospital Stay: Payer: Medicare Other

## 2016-07-27 VITALS — BP 111/65 | HR 102 | Temp 97.4°F | Resp 20 | Ht 72.0 in | Wt 170.0 lb

## 2016-07-27 DIAGNOSIS — Z8042 Family history of malignant neoplasm of prostate: Secondary | ICD-10-CM

## 2016-07-27 DIAGNOSIS — C7951 Secondary malignant neoplasm of bone: Secondary | ICD-10-CM | POA: Diagnosis not present

## 2016-07-27 DIAGNOSIS — C61 Malignant neoplasm of prostate: Secondary | ICD-10-CM

## 2016-07-27 DIAGNOSIS — D6481 Anemia due to antineoplastic chemotherapy: Secondary | ICD-10-CM

## 2016-07-27 DIAGNOSIS — Z808 Family history of malignant neoplasm of other organs or systems: Secondary | ICD-10-CM

## 2016-07-27 DIAGNOSIS — R5383 Other fatigue: Secondary | ICD-10-CM

## 2016-07-27 DIAGNOSIS — Z7689 Persons encountering health services in other specified circumstances: Secondary | ICD-10-CM | POA: Diagnosis not present

## 2016-07-27 DIAGNOSIS — F17210 Nicotine dependence, cigarettes, uncomplicated: Secondary | ICD-10-CM | POA: Diagnosis not present

## 2016-07-27 DIAGNOSIS — Z79899 Other long term (current) drug therapy: Secondary | ICD-10-CM

## 2016-07-27 DIAGNOSIS — R011 Cardiac murmur, unspecified: Secondary | ICD-10-CM | POA: Diagnosis not present

## 2016-07-27 DIAGNOSIS — E785 Hyperlipidemia, unspecified: Secondary | ICD-10-CM | POA: Diagnosis not present

## 2016-07-27 DIAGNOSIS — Z923 Personal history of irradiation: Secondary | ICD-10-CM | POA: Diagnosis not present

## 2016-07-27 DIAGNOSIS — R0602 Shortness of breath: Secondary | ICD-10-CM

## 2016-07-27 DIAGNOSIS — J45909 Unspecified asthma, uncomplicated: Secondary | ICD-10-CM | POA: Diagnosis not present

## 2016-07-27 DIAGNOSIS — Z800 Family history of malignant neoplasm of digestive organs: Secondary | ICD-10-CM

## 2016-07-27 DIAGNOSIS — Z7952 Long term (current) use of systemic steroids: Secondary | ICD-10-CM

## 2016-07-27 MED ORDER — DEXTROSE 5 % IV SOLN
20.0000 mg/m2 | Freq: Once | INTRAVENOUS | Status: AC
  Administered 2016-07-27: 40 mg via INTRAVENOUS
  Filled 2016-07-27: qty 4

## 2016-07-27 MED ORDER — PALONOSETRON HCL INJECTION 0.25 MG/5ML
0.2500 mg | Freq: Once | INTRAVENOUS | Status: AC
  Administered 2016-07-27: 0.25 mg via INTRAVENOUS
  Filled 2016-07-27: qty 5

## 2016-07-27 MED ORDER — FAMOTIDINE IN NACL 20-0.9 MG/50ML-% IV SOLN
20.0000 mg | Freq: Once | INTRAVENOUS | Status: AC
  Administered 2016-07-27: 20 mg via INTRAVENOUS
  Filled 2016-07-27: qty 50

## 2016-07-27 MED ORDER — PEGFILGRASTIM 6 MG/0.6ML ~~LOC~~ PSKT
6.0000 mg | PREFILLED_SYRINGE | Freq: Once | SUBCUTANEOUS | Status: AC
  Administered 2016-07-27: 6 mg via SUBCUTANEOUS
  Filled 2016-07-27: qty 0.6

## 2016-07-27 MED ORDER — SODIUM CHLORIDE 0.9 % IV SOLN
460.0000 mg | Freq: Once | INTRAVENOUS | Status: AC
  Administered 2016-07-27: 460 mg via INTRAVENOUS
  Filled 2016-07-27: qty 46

## 2016-07-27 MED ORDER — DIPHENHYDRAMINE HCL 50 MG/ML IJ SOLN
25.0000 mg | Freq: Once | INTRAMUSCULAR | Status: AC
  Administered 2016-07-27: 25 mg via INTRAVENOUS
  Filled 2016-07-27: qty 1

## 2016-07-27 MED ORDER — SODIUM CHLORIDE 0.9 % IV SOLN
Freq: Once | INTRAVENOUS | Status: AC
  Administered 2016-07-27: 09:00:00 via INTRAVENOUS
  Filled 2016-07-27: qty 1000

## 2016-07-27 MED ORDER — DEXAMETHASONE SODIUM PHOSPHATE 10 MG/ML IJ SOLN
10.0000 mg | Freq: Once | INTRAMUSCULAR | Status: AC
  Administered 2016-07-27: 10 mg via INTRAVENOUS
  Filled 2016-07-27: qty 1

## 2016-07-27 NOTE — Assessment & Plan Note (Addendum)
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 Carboplatin was added to Cabazitaxel with cycle 3. PSA response noted- as above/plateaued PSA ~ 2400. FEB 2018- CT scan/bone scan shows stable bone lesions. No evidence of any visceral metastases.     # proceed with  #7 cycle cabazitaxel-carboplatin AUC 5 today. Labs today reviewed;  acceptable for treatment today except for anemia [see below]. Discussed with the patient/wife- that his PSA continues to go up- then would recommend alternative treatment options. Patient has appointment at [facility] on April 17.  # Anemia sec to chemo- 8.9- check iron studies; hold tube in 10 days; possible PRBC transfusion.   # Rosalita Chessman 2/28]; plan x-geva on 3/28.   # Last Lupron q 69M-  jan 30th 2018.   # X-geva on 3/28; labs-hold tube in 10 days; follow up in 3 weeks/ MD/chemo/PSA/labs 2 days prior.

## 2016-07-27 NOTE — Progress Notes (Signed)
[place] OFFICE PROGRESS NOTE  Patient Care Team: Lenard Simmer, MD as PCP - General (Endocrinology)  Cancer Staging No matching staging information was found for the patient.   Oncology History   1. Carcinoma of prostate diagnosis in 2006.  Had received radiation therapy, external beam to prostate and pelvic lymph nodes.  Had Gleason 9 (4+5.)  PainBaseline PSA was 13. 2. Received Lupron injection after her radiation therapy also.  PSA was going up.  Last year or so received Lupron injection in September but did not have any followup.   3. Recently (this August, 2012) PSA was found to be 17. Patient was started on Lupron from September of 2012. 4. Patient has progressive disease by PSA criteria (October, 2013) 5. Patient was started on Zytiga and prednisone in November of 2013. 6.traumatic hip fracture ((right femur) status post internally fixed ,no evidence of metastatic disease(May of 2014) 7.bone scan in July of 2014 at outside institutions shows metastases to left scapula and left 11th rib/.. 8.progressive On ZYTIGA by PSA criteria.  9[name], September of 2015- Progression- Jan 2016  # AUG 11th 2017- Provenge 2 of planned 3 [sec to logistical issues]  # SEP 2017- START DOCETAXEL weekly; CT C/A/P-Progressive bone mets [compared to 2016 CT]PSA PROGRESSION; NOV 2017BONE SCAN STABLE  # NOV 21st 2017-  CABAZITAXEL q 3 W; 05/02/2016-Added Carboplatin with cycle #3 [given rising PSA]  # FEB 2018- GUARDIANT TESTING- AR-7/ no BRCA or targettable mutations.      Prostate cancer metastatic to bone [facility])   10/19/2015 Initial Diagnosis    Prostate cancer metastatic to bone [facility])        INTERVAL HISTORY:  Preston Wilson 81 y.o.  male pleasant patient above history of Metastatic prostate cancer status post multiple lines of therapy Currently cabazitaxel- [added carboplatin AUC 4 with cycle #3] Status post cycle # 6 is here for follow-up.   Patient's  appetite is good. He is not losing any weight. Denies any significant fatigue. No significant shortness of breath. No diarrhea. No tingling or numbness. No fevers or chills. Denies any pain. No cough. No swelling of the legs.   REVIEW OF SYSTEMS:  A complete 10 point review of system is done which is negative except mentioned above/history of present illness.   PAST MEDICAL HISTORY :  Past Medical History:  Diagnosis Date  . Asthma   . Heart murmur   . Hyperlipidemia   . Hypocalcemia   . Osteomyelitis ([place])   . Prostate cancer ([place])   . SOB (shortness of breath)     PAST SURGICAL HISTORY :   Past Surgical History:  Procedure Laterality Date  . COLONOSCOPY WITH PROPOFOL N/A 04/12/2015   Procedure: COLONOSCOPY WITH PROPOFOL;  Surgeon: Lollie Sails, MD;  Location: [facility] ENDOSCOPY;  Service: Endoscopy;  Laterality: N/A;  . EXCISIONAL HEMORRHOIDECTOMY    . TONSILLECTOMY      FAMILY HISTORY :   Family History  Problem Relation Age of Onset  . Colon cancer Father     deceased  . Prostate cancer Brother     deceased  . Other Child     neuroblastoma    SOCIAL HISTORY:   Social History  Substance Use Topics  . Smoking status: Current Every Day Smoker    Packs/day: 0.50    Types: Cigarettes  . Smokeless tobacco: Never Used  . Alcohol use No    ALLERGIES:  has No Known Allergies.  MEDICATIONS:  Current Outpatient  Prescriptions  Medication Sig Dispense Refill  . ADVAIR DISKUS 100-50 MCG/DOSE AEPB inhale 1 dose by mouth twice a day  0  . calcium-vitamin D (OSCAL WITH D) 500-200 MG-UNIT per tablet Take 1 tablet by mouth daily with breakfast.     . Leuprolide Acetate (LUPRON IJ) Inject 1 mL as directed every 3 (three) months.    . predniSONE (DELTASONE) 5 MG tablet Take 1 tablet (5 mg total) by mouth 2 (two) times daily with a meal. (Patient taking differently: Take 5 mg by mouth daily with breakfast. ) 60 tablet 3  . Vitamin D, Ergocalciferol, (DRISDOL) 50000 UNITS CAPS  capsule Take 50,000 Units by mouth once a week. Fridays  0  . diphenoxylate-atropine (LOMOTIL) 2.5-0.025 MG tablet Take 1 tablet by mouth 4 (four) times daily as needed for diarrhea or loose stools. Take it along with immodium (Patient not taking: Reported on 05/02/2016) 40 tablet 0  . Multiple Vitamin (MULTIVITAMIN) tablet Take 1 tablet by mouth daily.    . ondansetron (ZOFRAN) 8 MG tablet 1 pill every 8 hours as needed for nausea/vomitting (Patient not taking: Reported on 05/02/2016) 40 tablet 1  . prochlorperazine (COMPAZINE) 10 MG tablet take 1 tablet by mouth every 6 hours if needed for nausea and vomiting  0   No current facility-administered medications for this visit.     PHYSICAL EXAMINATION: ECOG PERFORMANCE STATUS: 0 - Asymptomatic  BP 111/65 (BP Location: Left Arm, Patient Position: Sitting)   Pulse (!) 102   Temp 97.4 F (36.3 C) (Tympanic)   Resp 20   Ht 6' (1.829 m)   Wt 170 lb (77.1 kg)   BMI 23.06 kg/m   Filed Weights   07/27/16 0838  Weight: 170 lb (77.1 kg)    GENERAL: Well-nourished well-developed; Alert, no distress and comfortable.   Accompanied by family. EYES: no pallor or icterus OROPHARYNX: no thrush or ulceration; good dentition  NECK: supple, no masses felt LYMPH:  no palpable lymphadenopathy in the cervical, axillary or inguinal regions LUNGS: clear to auscultation and  No wheeze or crackles HEART/CVS: regular rate & rhythm and positive for murmurs; No lower extremity edema ABDOMEN:abdomen soft, non-tender and normal bowel sounds Musculoskeletal:no cyanosis of digits and no clubbing  PSYCH: alert & oriented x 3 with fluent speech NEURO: no focal motor/sensory deficits SKIN:  no rashes or significant lesions  LABORATORY DATA:  I have reviewed the data as listed    Component Value Date/Time   NA 135 07/25/2016 1000   NA 138 08/25/2014 1442   K 4.0 07/25/2016 1000   K 4.1 08/25/2014 1442   CL 105 07/25/2016 1000   CL 106 08/25/2014 1442    CO2 23 07/25/2016 1000   CO2 25 08/25/2014 1442   GLUCOSE 109 (H) 07/25/2016 1000   GLUCOSE 138 (H) 08/25/2014 1442   BUN 15 07/25/2016 1000   BUN 19 08/25/2014 1442   CREATININE 0.54 (L) 07/25/2016 1000   CREATININE 0.53 (L) 08/25/2014 1442   CALCIUM 8.8 (L) 07/25/2016 1000   CALCIUM 9.0 08/25/2014 1442   PROT 7.1 07/25/2016 1000   PROT 6.8 08/25/2014 1442   ALBUMIN 4.1 07/25/2016 1000   ALBUMIN 3.9 08/25/2014 1442   AST 25 07/25/2016 1000   AST 18 08/25/2014 1442   ALT 15 (L) 07/25/2016 1000   ALT 13 (L) 08/25/2014 1442   ALKPHOS 273 (H) 07/25/2016 1000   ALKPHOS 73 08/25/2014 1442   BILITOT 0.5 07/25/2016 1000   BILITOT 0.7 08/25/2014 1442  GFRNONAA >60 07/25/2016 1000   GFRNONAA >60 08/25/2014 1442   GFRAA >60 07/25/2016 1000   GFRAA >60 08/25/2014 1442    No results found for: SPEP, UPEP  Lab Results  Component Value Date   WBC 5.5 07/25/2016   NEUTROABS 3.1 07/25/2016   HGB 8.9 (L) 07/25/2016   HCT 26.3 (L) 07/25/2016   MCV 95.8 07/25/2016   PLT 144 (L) 07/25/2016      Chemistry      Component Value Date/Time   NA 135 07/25/2016 1000   NA 138 08/25/2014 1442   K 4.0 07/25/2016 1000   K 4.1 08/25/2014 1442   CL 105 07/25/2016 1000   CL 106 08/25/2014 1442   CO2 23 07/25/2016 1000   CO2 25 08/25/2014 1442   BUN 15 07/25/2016 1000   BUN 19 08/25/2014 1442   CREATININE 0.54 (L) 07/25/2016 1000   CREATININE 0.53 (L) 08/25/2014 1442      Component Value Date/Time   CALCIUM 8.8 (L) 07/25/2016 1000   CALCIUM 9.0 08/25/2014 1442   ALKPHOS 273 (H) 07/25/2016 1000   ALKPHOS 73 08/25/2014 1442   AST 25 07/25/2016 1000   AST 18 08/25/2014 1442   ALT 15 (L) 07/25/2016 1000   ALT 13 (L) 08/25/2014 1442   BILITOT 0.5 07/25/2016 1000   BILITOT 0.7 08/25/2014 1442      Results for Preston Wilson, Preston Wilson (MRN [id number]) as of 07/27/2016 16:30  Ref. Range 01/20/2016 08:21 02/20/2016 08:57 03/12/2016 09:32 04/20/2016 10:25 05/25/2016 10:09 06/15/2016 09:08  07/04/2016 09:42 07/25/2016 10:00  PSA Latest Ref Range: 0.00 - 4.00 ng/mL 296.00 (H) 756.00 (H) 1,325.00 (H) 2,886.00 (H) 2,409.00 (H) 2,328.00 (H) 2,280.00 (H) 2,472.00 (H)     RADIOGRAPHIC STUDIES: I have personally reviewed the radiological images as listed and agreed with the findings in the report. No results found.   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 Carboplatin was added to Cabazitaxel with cycle 3. PSA response noted- as above/plateaued PSA ~ 2400. FEB 2018- CT scan/bone scan shows stable bone lesions. No evidence of any visceral metastases.     # proceed with  #7 cycle cabazitaxel-carboplatin AUC 5 today. Labs today reviewed;  acceptable for treatment today except for anemia [see below]. Discussed with the patient/wife- that his PSA continues to go up- then would recommend alternative treatment options. Patient has appointment at [facility] on April 17.  # Anemia sec to chemo- 8.9- check iron studies; hold tube in 10 days; possible PRBC transfusion.   # Rosalita Chessman 2/28]; plan x-geva on 3/28.   # Last Lupron q 34M-  jan 30th 2018.   # X-geva on 3/28; labs-hold tube in 10 days; follow up in 3 weeks/ MD/chemo/PSA/labs 2 days prior.   Orders Placed This Encounter  Procedures  . CBC with Differential    Standing Status:   Future    Standing Expiration Date:   07/27/2017  . Comprehensive metabolic panel    Standing Status:   Future    Standing Expiration Date:   07/27/2017  . CBC with Differential    Standing Status:   Future    Standing Expiration Date:   07/27/2017  . Basic metabolic panel    Standing Status:   Future    Standing Expiration Date:   07/27/2017  . Hold Tube- Blood Bank    Standing Status:   Future    Standing Expiration Date:  07/27/2017  . Hold Tube- Blood Bank    Standing Status:   Future    Standing Expiration Date:   07/27/2017      Cammie Sickle, MD 07/27/2016 4:34 PM

## 2016-07-30 ENCOUNTER — Telehealth: Payer: Self-pay | Admitting: Internal Medicine

## 2016-07-30 ENCOUNTER — Other Ambulatory Visit: Payer: Self-pay | Admitting: Internal Medicine

## 2016-07-30 NOTE — Progress Notes (Signed)
Spoke to [facility] plan to proceed with cycle #8 in 3 weeks; and also will fax [employer] to [facility] office- [phone].

## 2016-07-30 NOTE — Telephone Encounter (Signed)
Spoke to [facility] at [facility]; recommend- continued cycle #8 as planned; re-staging after.  J/H- please fax [employer] to [facility]/ fax #[phone] [attn: Dr.McNamara]. Thx

## 2016-07-31 NOTE — Telephone Encounter (Signed)
msg sent to medical records team to fax records to [facility]

## 2016-08-01 ENCOUNTER — Other Ambulatory Visit: Payer: Self-pay

## 2016-08-01 ENCOUNTER — Ambulatory Visit: Payer: Self-pay

## 2016-08-01 ENCOUNTER — Inpatient Hospital Stay: Payer: Medicare Other

## 2016-08-01 DIAGNOSIS — C61 Malignant neoplasm of prostate: Secondary | ICD-10-CM

## 2016-08-01 DIAGNOSIS — C7951 Secondary malignant neoplasm of bone: Principal | ICD-10-CM

## 2016-08-01 MED ORDER — DENOSUMAB 120 MG/1.7ML ~~LOC~~ SOLN
120.0000 mg | Freq: Once | SUBCUTANEOUS | Status: AC
  Administered 2016-08-01: 120 mg via SUBCUTANEOUS
  Filled 2016-08-01: qty 1.7

## 2016-08-06 ENCOUNTER — Other Ambulatory Visit: Payer: Self-pay | Admitting: *Deleted

## 2016-08-06 ENCOUNTER — Inpatient Hospital Stay: Payer: Medicare Other

## 2016-08-06 ENCOUNTER — Inpatient Hospital Stay: Payer: Medicare Other | Attending: Internal Medicine

## 2016-08-06 ENCOUNTER — Ambulatory Visit: Payer: Self-pay | Admitting: Internal Medicine

## 2016-08-06 DIAGNOSIS — E785 Hyperlipidemia, unspecified: Secondary | ICD-10-CM | POA: Diagnosis not present

## 2016-08-06 DIAGNOSIS — Z800 Family history of malignant neoplasm of digestive organs: Secondary | ICD-10-CM | POA: Diagnosis not present

## 2016-08-06 DIAGNOSIS — Z79899 Other long term (current) drug therapy: Secondary | ICD-10-CM | POA: Insufficient documentation

## 2016-08-06 DIAGNOSIS — D696 Thrombocytopenia, unspecified: Secondary | ICD-10-CM | POA: Diagnosis not present

## 2016-08-06 DIAGNOSIS — J45909 Unspecified asthma, uncomplicated: Secondary | ICD-10-CM | POA: Diagnosis not present

## 2016-08-06 DIAGNOSIS — Z923 Personal history of irradiation: Secondary | ICD-10-CM | POA: Insufficient documentation

## 2016-08-06 DIAGNOSIS — Z7689 Persons encountering health services in other specified circumstances: Secondary | ICD-10-CM | POA: Diagnosis not present

## 2016-08-06 DIAGNOSIS — Z5111 Encounter for antineoplastic chemotherapy: Secondary | ICD-10-CM | POA: Insufficient documentation

## 2016-08-06 DIAGNOSIS — D6481 Anemia due to antineoplastic chemotherapy: Secondary | ICD-10-CM | POA: Diagnosis not present

## 2016-08-06 DIAGNOSIS — D649 Anemia, unspecified: Secondary | ICD-10-CM

## 2016-08-06 DIAGNOSIS — C7951 Secondary malignant neoplasm of bone: Secondary | ICD-10-CM | POA: Diagnosis not present

## 2016-08-06 DIAGNOSIS — C61 Malignant neoplasm of prostate: Secondary | ICD-10-CM | POA: Insufficient documentation

## 2016-08-06 DIAGNOSIS — R0602 Shortness of breath: Secondary | ICD-10-CM | POA: Insufficient documentation

## 2016-08-06 DIAGNOSIS — R011 Cardiac murmur, unspecified: Secondary | ICD-10-CM | POA: Diagnosis not present

## 2016-08-06 DIAGNOSIS — F17210 Nicotine dependence, cigarettes, uncomplicated: Secondary | ICD-10-CM | POA: Diagnosis not present

## 2016-08-06 LAB — CBC WITH DIFFERENTIAL/PLATELET
Basophils Absolute: 0 10*3/uL (ref 0–0.1)
Basophils Relative: 0 %
EOS ABS: 0 10*3/uL (ref 0–0.7)
EOS PCT: 0 %
HCT: 23.3 % — ABNORMAL LOW (ref 40.0–52.0)
Hemoglobin: 7.9 g/dL — ABNORMAL LOW (ref 13.0–18.0)
LYMPHS PCT: 19 %
Lymphs Abs: 1.7 10*3/uL (ref 1.0–3.6)
MCH: 33 pg (ref 26.0–34.0)
MCHC: 34 g/dL (ref 32.0–36.0)
MCV: 97.3 fL (ref 80.0–100.0)
Monocytes Absolute: 0.5 10*3/uL (ref 0.2–1.0)
Monocytes Relative: 5 %
Neutro Abs: 6.7 10*3/uL — ABNORMAL HIGH (ref 1.4–6.5)
Neutrophils Relative %: 76 %
Platelets: 106 10*3/uL — ABNORMAL LOW (ref 150–440)
RBC: 2.39 MIL/uL — AB (ref 4.40–5.90)
RDW: 21.6 % — AB (ref 11.5–14.5)
WBC: 8.9 10*3/uL (ref 3.8–10.6)

## 2016-08-06 LAB — SAMPLE TO BLOOD BANK

## 2016-08-06 LAB — BASIC METABOLIC PANEL
Anion gap: 7 (ref 5–15)
BUN: 15 mg/dL (ref 6–20)
CALCIUM: 8.6 mg/dL — AB (ref 8.9–10.3)
CO2: 24 mmol/L (ref 22–32)
Chloride: 106 mmol/L (ref 101–111)
Creatinine, Ser: 0.32 mg/dL — ABNORMAL LOW (ref 0.61–1.24)
Glucose, Bld: 100 mg/dL — ABNORMAL HIGH (ref 65–99)
POTASSIUM: 4.1 mmol/L (ref 3.5–5.1)
SODIUM: 137 mmol/L (ref 135–145)

## 2016-08-06 LAB — PREPARE RBC (CROSSMATCH)

## 2016-08-06 MED ORDER — ACETAMINOPHEN 325 MG PO TABS
650.0000 mg | ORAL_TABLET | Freq: Once | ORAL | Status: AC
  Administered 2016-08-06: 650 mg via ORAL
  Filled 2016-08-06: qty 2

## 2016-08-06 MED ORDER — DIPHENHYDRAMINE HCL 25 MG PO CAPS
25.0000 mg | ORAL_CAPSULE | Freq: Once | ORAL | Status: AC
  Administered 2016-08-06: 25 mg via ORAL
  Filled 2016-08-06: qty 1

## 2016-08-06 MED ORDER — SODIUM CHLORIDE 0.9 % IV SOLN
INTRAVENOUS | Status: DC
  Administered 2016-08-06: 09:00:00 via INTRAVENOUS
  Filled 2016-08-06: qty 1000

## 2016-08-06 NOTE — Progress Notes (Signed)
V/o Dr. Rogue Bussing- ordered 1 unit of blood / over 2 hrs. hgb 7.9.

## 2016-08-07 LAB — TYPE AND SCREEN
ABO/RH(D): O POS
ANTIBODY SCREEN: NEGATIVE
UNIT DIVISION: 0

## 2016-08-07 LAB — BPAM RBC
Blood Product Expiration Date: 201804242359
ISSUE DATE / TIME: 201804021040
Unit Type and Rh: 5100

## 2016-08-15 ENCOUNTER — Inpatient Hospital Stay: Payer: Medicare Other | Admitting: *Deleted

## 2016-08-15 DIAGNOSIS — C7951 Secondary malignant neoplasm of bone: Principal | ICD-10-CM

## 2016-08-15 DIAGNOSIS — C61 Malignant neoplasm of prostate: Secondary | ICD-10-CM | POA: Diagnosis not present

## 2016-08-15 LAB — COMPREHENSIVE METABOLIC PANEL
ALT: 14 U/L — AB (ref 17–63)
AST: 21 U/L (ref 15–41)
Albumin: 4.1 g/dL (ref 3.5–5.0)
Alkaline Phosphatase: 249 U/L — ABNORMAL HIGH (ref 38–126)
Anion gap: 7 (ref 5–15)
BUN: 16 mg/dL (ref 6–20)
CHLORIDE: 105 mmol/L (ref 101–111)
CO2: 23 mmol/L (ref 22–32)
CREATININE: 0.44 mg/dL — AB (ref 0.61–1.24)
Calcium: 8.9 mg/dL (ref 8.9–10.3)
GFR calc Af Amer: >60 mL/min (ref >60)
Glucose, Bld: 118 mg/dL — ABNORMAL HIGH (ref 65–99)
Potassium: 4.1 mmol/L (ref 3.5–5.1)
SODIUM: 135 mmol/L (ref 135–145)
Total Bilirubin: 0.6 mg/dL (ref 0.3–1.2)
Total Protein: 6.7 g/dL (ref 6.5–8.1)

## 2016-08-15 LAB — SAMPLE TO BLOOD BANK

## 2016-08-15 LAB — CBC WITH DIFFERENTIAL/PLATELET
BASOS ABS: 0 10*3/uL (ref 0–0.1)
Basophils Relative: 0 %
EOS PCT: 0 %
Eosinophils Absolute: 0 10*3/uL (ref 0–0.7)
HCT: 27.5 % — ABNORMAL LOW (ref 40.0–52.0)
Hemoglobin: 9.5 g/dL — ABNORMAL LOW (ref 13.0–18.0)
LYMPHS PCT: 22 %
Lymphs Abs: 1.2 10*3/uL (ref 1.0–3.6)
MCH: 33 pg (ref 26.0–34.0)
MCHC: 34.5 g/dL (ref 32.0–36.0)
MCV: 95.4 fL (ref 80.0–100.0)
Monocytes Absolute: 0.4 10*3/uL (ref 0.2–1.0)
Monocytes Relative: 7 %
NEUTROS PCT: 71 %
Neutro Abs: 3.8 10*3/uL (ref 1.4–6.5)
PLATELETS: 125 10*3/uL — AB (ref 150–440)
RBC: 2.89 MIL/uL — AB (ref 4.40–5.90)
RDW: 21.1 % — ABNORMAL HIGH (ref 11.5–14.5)
WBC: 5.4 10*3/uL (ref 3.8–10.6)

## 2016-08-16 LAB — PSA: PSA: 2400 ng/mL — AB (ref 0.00–4.00)

## 2016-08-17 ENCOUNTER — Inpatient Hospital Stay: Payer: Medicare Other

## 2016-08-17 ENCOUNTER — Inpatient Hospital Stay (HOSPITAL_BASED_OUTPATIENT_CLINIC_OR_DEPARTMENT_OTHER): Payer: Medicare Other | Admitting: Internal Medicine

## 2016-08-17 VITALS — BP 113/68 | HR 101 | Temp 97.6°F | Resp 16 | Wt 173.5 lb

## 2016-08-17 VITALS — BP 138/73 | HR 101

## 2016-08-17 DIAGNOSIS — R0602 Shortness of breath: Secondary | ICD-10-CM

## 2016-08-17 DIAGNOSIS — D6481 Anemia due to antineoplastic chemotherapy: Secondary | ICD-10-CM | POA: Diagnosis not present

## 2016-08-17 DIAGNOSIS — R011 Cardiac murmur, unspecified: Secondary | ICD-10-CM

## 2016-08-17 DIAGNOSIS — C61 Malignant neoplasm of prostate: Secondary | ICD-10-CM | POA: Diagnosis not present

## 2016-08-17 DIAGNOSIS — C7951 Secondary malignant neoplasm of bone: Principal | ICD-10-CM

## 2016-08-17 DIAGNOSIS — E785 Hyperlipidemia, unspecified: Secondary | ICD-10-CM | POA: Diagnosis not present

## 2016-08-17 DIAGNOSIS — D696 Thrombocytopenia, unspecified: Secondary | ICD-10-CM | POA: Diagnosis not present

## 2016-08-17 DIAGNOSIS — Z79899 Other long term (current) drug therapy: Secondary | ICD-10-CM

## 2016-08-17 DIAGNOSIS — F17210 Nicotine dependence, cigarettes, uncomplicated: Secondary | ICD-10-CM | POA: Diagnosis not present

## 2016-08-17 DIAGNOSIS — J45909 Unspecified asthma, uncomplicated: Secondary | ICD-10-CM | POA: Diagnosis not present

## 2016-08-17 DIAGNOSIS — Z7689 Persons encountering health services in other specified circumstances: Secondary | ICD-10-CM | POA: Diagnosis not present

## 2016-08-17 DIAGNOSIS — Z923 Personal history of irradiation: Secondary | ICD-10-CM

## 2016-08-17 DIAGNOSIS — Z800 Family history of malignant neoplasm of digestive organs: Secondary | ICD-10-CM

## 2016-08-17 MED ORDER — PALONOSETRON HCL INJECTION 0.25 MG/5ML
0.2500 mg | Freq: Once | INTRAVENOUS | Status: AC
  Administered 2016-08-17: 0.25 mg via INTRAVENOUS
  Filled 2016-08-17: qty 5

## 2016-08-17 MED ORDER — SODIUM CHLORIDE 0.9 % IV SOLN
20.0000 mg/m2 | Freq: Once | INTRAVENOUS | Status: AC
  Administered 2016-08-17: 40 mg via INTRAVENOUS
  Filled 2016-08-17: qty 4

## 2016-08-17 MED ORDER — DEXAMETHASONE SODIUM PHOSPHATE 10 MG/ML IJ SOLN
10.0000 mg | Freq: Once | INTRAMUSCULAR | Status: AC
  Administered 2016-08-17: 10 mg via INTRAVENOUS
  Filled 2016-08-17: qty 1

## 2016-08-17 MED ORDER — PEGFILGRASTIM 6 MG/0.6ML ~~LOC~~ PSKT
6.0000 mg | PREFILLED_SYRINGE | Freq: Once | SUBCUTANEOUS | Status: AC
  Administered 2016-08-17: 6 mg via SUBCUTANEOUS

## 2016-08-17 MED ORDER — SODIUM CHLORIDE 0.9 % IV SOLN
458.5000 mg | Freq: Once | INTRAVENOUS | Status: AC
  Administered 2016-08-17: 460 mg via INTRAVENOUS
  Filled 2016-08-17: qty 46

## 2016-08-17 MED ORDER — FAMOTIDINE IN NACL 20-0.9 MG/50ML-% IV SOLN
20.0000 mg | Freq: Once | INTRAVENOUS | Status: AC
Start: 2016-08-17 — End: 2016-08-17
  Administered 2016-08-17: 20 mg via INTRAVENOUS
  Filled 2016-08-17: qty 50

## 2016-08-17 MED ORDER — DIPHENHYDRAMINE HCL 50 MG/ML IJ SOLN
25.0000 mg | Freq: Once | INTRAMUSCULAR | Status: AC
  Administered 2016-08-17: 25 mg via INTRAVENOUS
  Filled 2016-08-17: qty 1

## 2016-08-17 MED ORDER — SODIUM CHLORIDE 0.9 % IV SOLN
Freq: Once | INTRAVENOUS | Status: AC
  Administered 2016-08-17: 10:00:00 via INTRAVENOUS
  Filled 2016-08-17: qty 1000

## 2016-08-17 NOTE — Assessment & Plan Note (Addendum)
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 Carboplatin was added to Cabazitaxel with cycle 3. PSA response noted- as above/plateaued PSA ~ 2400. FEB 2018- CT scan/bone scan shows stable bone lesions. No evidence of any visceral metastases.     # proceed with  #8 cycle cabazitaxel-carboplatin AUC 5 today. Labs today reviewed;  acceptable for treatment today except for anemia; mild thrombocytopenia platelets-125. [see below].  # Discussed with the patient/wife- that his PSA continues to go up- then would recommend alternative treatment options- including xofigo/ clinical trials at [facility]. I discussed with Dr.McNamara.  Patient has appointment at [facility] on April 17.  # Anemia sec to chemo-today 9.8; s/p PRBC transfusion 1 units 10 days ago; again the PET labs in 10 days.  # Rosalita Chessman 2/28]; plan x-geva on 3/28;  Last Lupron q 35M- jan 30th 2018.; repeat both may 1st.   # 10 days/labs- possible transfusion. Follow-up in approximately 3 weeks/CT chest abdomen pelvis with contrast and bone scan prior.

## 2016-08-17 NOTE — Progress Notes (Signed)
Patient here today for follow up.  Patient states no new concerns today  

## 2016-08-17 NOTE — Progress Notes (Signed)
[place] OFFICE PROGRESS NOTE  Patient Care Team: Lenard Simmer, MD as PCP - General (Endocrinology)  Cancer Staging No matching staging information was found for the patient.   Oncology History   1. Carcinoma of prostate diagnosis in 2006.  Had received radiation therapy, external beam to prostate and pelvic lymph nodes.  Had Gleason 9 (4+5.)  PainBaseline PSA was 13. 2. Received Lupron injection after her radiation therapy also.  PSA was going up.  Last year or so received Lupron injection in September but did not have any followup.   3. Recently (this August, 2012) PSA was found to be 17. Patient was started on Lupron from September of 2012. 4. Patient has progressive disease by PSA criteria (October, 2013) 5. Patient was started on Zytiga and prednisone in November of 2013. 6.traumatic hip fracture ((right femur) status post internally fixed ,no evidence of metastatic disease(May of 2014) 7.bone scan in July of 2014 at outside institutions shows metastases to left scapula and left 11th rib/.. 8.progressive On ZYTIGA by PSA criteria.  9[name], September of 2015- Progression- Jan 2016  # AUG 11th 2017- Provenge 2 of planned 3 [sec to logistical issues]  # SEP 2017- START DOCETAXEL weekly; CT C/A/P-Progressive bone mets [compared to 2016 CT]PSA PROGRESSION; NOV 2017BONE SCAN STABLE  # NOV 21st 2017-  CABAZITAXEL q 3 W; 05/02/2016-Added Carboplatin with cycle #3 [given rising PSA]  # FEB 2018- GUARDIANT TESTING- AR-7/ no BRCA or targettable mutations.      Prostate cancer metastatic to bone [facility])   10/19/2015 Initial Diagnosis    Prostate cancer metastatic to bone [facility])        INTERVAL HISTORY:  Preston Wilson 81 y.o.  male pleasant patient above history of Metastatic prostate cancer status post multiple lines of therapy Currently cabazitaxel- [added carboplatin AUC 4 with cycle #3] Status post cycle # 7 is here for follow-up.   Amazingly  patient continues to do well. Denies any pain. Denies any loss of appetite. Denies any diarrhea. Denies any fevers. Patient had 1 unit of blood approximately 10 days ago.  Denies any significant fatigue. No significant shortness of breath. No diarrhea. No tingling or numbness. No fevers or chills. No cough. No swelling of the legs.   REVIEW OF SYSTEMS:  A complete 10 point review of system is done which is negative except mentioned above/history of present illness.   PAST MEDICAL HISTORY :  Past Medical History:  Diagnosis Date  . Asthma   . Heart murmur   . Hyperlipidemia   . Hypocalcemia   . Osteomyelitis ([place])   . Prostate cancer ([place])   . SOB (shortness of breath)     PAST SURGICAL HISTORY :   Past Surgical History:  Procedure Laterality Date  . COLONOSCOPY WITH PROPOFOL N/A 04/12/2015   Procedure: COLONOSCOPY WITH PROPOFOL;  Surgeon: Lollie Sails, MD;  Location: [facility] ENDOSCOPY;  Service: Endoscopy;  Laterality: N/A;  . EXCISIONAL HEMORRHOIDECTOMY    . TONSILLECTOMY      FAMILY HISTORY :   Family History  Problem Relation Age of Onset  . Colon cancer Father     deceased  . Prostate cancer Brother     deceased  . Other Child     neuroblastoma    SOCIAL HISTORY:   Social History  Substance Use Topics  . Smoking status: Current Every Day Smoker    Packs/day: 0.50    Types: Cigarettes  . Smokeless tobacco: Never Used  .  Alcohol use No    ALLERGIES:  has No Known Allergies.  MEDICATIONS:  Current Outpatient Prescriptions  Medication Sig Dispense Refill  . ADVAIR DISKUS 100-50 MCG/DOSE AEPB inhale 1 dose by mouth twice a day  0  . calcium-vitamin D (OSCAL WITH D) 500-200 MG-UNIT per tablet Take 1 tablet by mouth daily with breakfast.     . Leuprolide Acetate (LUPRON IJ) Inject 1 mL as directed every 3 (three) months.    . Multiple Vitamin (MULTIVITAMIN) tablet Take 1 tablet by mouth daily.    . predniSONE (DELTASONE) 5 MG tablet Take 1 tablet (5 mg total) by  mouth 2 (two) times daily with a meal. (Patient taking differently: Take 5 mg by mouth daily with breakfast. ) 60 tablet 3  . Vitamin D, Ergocalciferol, (DRISDOL) 50000 UNITS CAPS capsule Take 50,000 Units by mouth once a week. Fridays  0  . ondansetron (ZOFRAN) 8 MG tablet 1 pill every 8 hours as needed for nausea/vomitting (Patient not taking: Reported on 05/02/2016) 40 tablet 1   No current facility-administered medications for this visit.     PHYSICAL EXAMINATION: ECOG PERFORMANCE STATUS: 0 - Asymptomatic  BP 113/68 (BP Location: Right Arm, Patient Position: Sitting)   Pulse (!) 101   Temp 97.6 F (36.4 C) (Tympanic)   Resp 16   Wt 173 lb 8 oz (78.7 kg)   SpO2 98%   BMI 23.53 kg/m   Filed Weights   08/17/16 0928  Weight: 173 lb 8 oz (78.7 kg)    GENERAL: Well-nourished well-developed; Alert, no distress and comfortable.   Accompanied by family. EYES: no pallor or icterus OROPHARYNX: no thrush or ulceration; good dentition  NECK: supple, no masses felt LYMPH:  no palpable lymphadenopathy in the cervical, axillary or inguinal regions LUNGS: clear to auscultation and  No wheeze or crackles HEART/CVS: regular rate & rhythm and positive for murmurs; No lower extremity edema ABDOMEN:abdomen soft, non-tender and normal bowel sounds Musculoskeletal:no cyanosis of digits and no clubbing  PSYCH: alert & oriented x 3 with fluent speech NEURO: no focal motor/sensory deficits SKIN:  no rashes or significant lesions  LABORATORY DATA:  I have reviewed the data as listed    Component Value Date/Time   NA 135 08/15/2016 0936   NA 138 08/25/2014 1442   K 4.1 08/15/2016 0936   K 4.1 08/25/2014 1442   CL 105 08/15/2016 0936   CL 106 08/25/2014 1442   CO2 23 08/15/2016 0936   CO2 25 08/25/2014 1442   GLUCOSE 118 (H) 08/15/2016 0936   GLUCOSE 138 (H) 08/25/2014 1442   BUN 16 08/15/2016 0936   BUN 19 08/25/2014 1442   CREATININE 0.44 (L) 08/15/2016 0936   CREATININE 0.53 (L)  08/25/2014 1442   CALCIUM 8.9 08/15/2016 0936   CALCIUM 9.0 08/25/2014 1442   PROT 6.7 08/15/2016 0936   PROT 6.8 08/25/2014 1442   ALBUMIN 4.1 08/15/2016 0936   ALBUMIN 3.9 08/25/2014 1442   AST 21 08/15/2016 0936   AST 18 08/25/2014 1442   ALT 14 (L) 08/15/2016 0936   ALT 13 (L) 08/25/2014 1442   ALKPHOS 249 (H) 08/15/2016 0936   ALKPHOS 73 08/25/2014 1442   BILITOT 0.6 08/15/2016 0936   BILITOT 0.7 08/25/2014 1442   GFRNONAA >60 08/15/2016 0936   GFRNONAA >60 08/25/2014 1442   GFRAA >60 08/15/2016 0936   GFRAA >60 08/25/2014 1442    No results found for: SPEP, UPEP  Lab Results  Component Value Date   WBC  5.4 08/15/2016   NEUTROABS 3.8 08/15/2016   HGB 9.5 (L) 08/15/2016   HCT 27.5 (L) 08/15/2016   MCV 95.4 08/15/2016   PLT 125 (L) 08/15/2016      Chemistry      Component Value Date/Time   NA 135 08/15/2016 0936   NA 138 08/25/2014 1442   K 4.1 08/15/2016 0936   K 4.1 08/25/2014 1442   CL 105 08/15/2016 0936   CL 106 08/25/2014 1442   CO2 23 08/15/2016 0936   CO2 25 08/25/2014 1442   BUN 16 08/15/2016 0936   BUN 19 08/25/2014 1442   CREATININE 0.44 (L) 08/15/2016 0936   CREATININE 0.53 (L) 08/25/2014 1442      Component Value Date/Time   CALCIUM 8.9 08/15/2016 0936   CALCIUM 9.0 08/25/2014 1442   ALKPHOS 249 (H) 08/15/2016 0936   ALKPHOS 73 08/25/2014 1442   AST 21 08/15/2016 0936   AST 18 08/25/2014 1442   ALT 14 (L) 08/15/2016 0936   ALT 13 (L) 08/25/2014 1442   BILITOT 0.6 08/15/2016 0936   BILITOT 0.7 08/25/2014 1442      Results for STYLIANOS, STRADLING (MRN [id number]) as of 08/17/2016 09:34  Ref. Range 05/25/2016 10:09 06/15/2016 09:08 07/04/2016 09:42 07/25/2016 10:00 08/15/2016 09:38  PSA Latest Ref Range: 0.00 - 4.00 ng/mL 2,409.00 (H) 2,328.00 (H) 2,280.00 (H) 2,472.00 (H) 2,400.00 (H)      RADIOGRAPHIC STUDIES: I have personally reviewed the radiological images as listed and agreed with the findings in the report. No results found.    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 Carboplatin was added to Cabazitaxel with cycle 3. PSA response noted- as above/plateaued PSA ~ 2400. FEB 2018- CT scan/bone scan shows stable bone lesions. No evidence of any visceral metastases.     # proceed with  #8 cycle cabazitaxel-carboplatin AUC 5 today. Labs today reviewed;  acceptable for treatment today except for anemia; mild thrombocytopenia platelets-125. [see below].  # Discussed with the patient/wife- that his PSA continues to go up- then would recommend alternative treatment options- including xofigo/ clinical trials at [facility]. I discussed with Dr.McNamara.  Patient has appointment at [facility] on April 17.  # Anemia sec to chemo-today 9.8; s/p PRBC transfusion 1 units 10 days ago; again the PET labs in 10 days.  # Rosalita Chessman 2/28]; plan x-geva on 3/28;  Last Lupron q 74M- jan 30th 2018.; repeat both may 1st.   # 10 days/labs- possible transfusion. Follow-up in approximately 3 weeks/CT chest abdomen pelvis with contrast and bone scan prior.  Orders Placed This Encounter  Procedures  . CT ABDOMEN PELVIS W CONTRAST    Standing Status:   Future    Standing Expiration Date:   11/16/2017    Order Specific Question:   Reason for Exam (SYMPTOM  OR DIAGNOSIS REQUIRED)    Answer:   prostate cancer    Order Specific Question:   Preferred imaging location?    Answer:   [facility]  . CT CHEST W CONTRAST    Standing Status:   Future    Standing Expiration Date:   10/17/2017    Order Specific Question:   Reason for Exam (SYMPTOM  OR DIAGNOSIS REQUIRED)    Answer:   prostate cancer    Order Specific Question:   Preferred imaging location?    Answer:   [facility]  Standing Status:   Future    Standing Expiration Date:   10/17/2017    Order Specific Question:   Reason for Exam  (SYMPTOM  OR DIAGNOSIS REQUIRED)    Answer:   prostate cancer    Order Specific Question:   Preferred imaging location?    Answer:   [facility]  . CBC with Differential/Platelet    Standing Status:   Future    Standing Expiration Date:   08/17/2017  . PSA    Standing Status:   Future    Standing Expiration Date:   08/17/2017  . Basic metabolic panel    Standing Status:   Future    Standing Expiration Date:   08/17/2017  . Magnesium    Standing Status:   Future    Standing Expiration Date:   08/17/2017  . Comprehensive metabolic panel    Standing Status:   Future    Standing Expiration Date:   08/17/2017  . CBC with Differential/Platelet    Standing Status:   Future    Standing Expiration Date:   08/17/2017  . Sample to Blood Bank    Standing Status:   Future    Standing Expiration Date:   08/17/2017     Cammie Sickle, MD 08/17/2016 1:22 PM

## 2016-08-27 ENCOUNTER — Other Ambulatory Visit: Payer: Self-pay

## 2016-08-27 ENCOUNTER — Ambulatory Visit: Payer: Self-pay

## 2016-08-29 ENCOUNTER — Inpatient Hospital Stay: Payer: Medicare Other

## 2016-08-29 DIAGNOSIS — C61 Malignant neoplasm of prostate: Secondary | ICD-10-CM

## 2016-08-29 DIAGNOSIS — C7951 Secondary malignant neoplasm of bone: Principal | ICD-10-CM

## 2016-08-29 LAB — BASIC METABOLIC PANEL
Anion gap: 8 (ref 5–15)
BUN: 16 mg/dL (ref 6–20)
CALCIUM: 9.2 mg/dL (ref 8.9–10.3)
CO2: 23 mmol/L (ref 22–32)
Chloride: 105 mmol/L (ref 101–111)
Creatinine, Ser: 0.47 mg/dL — ABNORMAL LOW (ref 0.61–1.24)
GFR calc Af Amer: >60 mL/min (ref >60)
GLUCOSE: 112 mg/dL — AB (ref 65–99)
Potassium: 4.6 mmol/L (ref 3.5–5.1)
Sodium: 136 mmol/L (ref 135–145)

## 2016-08-29 LAB — CBC WITH DIFFERENTIAL/PLATELET
BASOS ABS: 0.1 10*3/uL (ref 0–0.1)
Basophils Relative: 1 %
Eosinophils Absolute: 0 10*3/uL (ref 0–0.7)
Eosinophils Relative: 0 %
HEMATOCRIT: 26.1 % — AB (ref 40.0–52.0)
Hemoglobin: 8.8 g/dL — ABNORMAL LOW (ref 13.0–18.0)
LYMPHS ABS: 1.4 10*3/uL (ref 1.0–3.6)
LYMPHS PCT: 22 %
MCH: 32.9 pg (ref 26.0–34.0)
MCHC: 33.7 g/dL (ref 32.0–36.0)
MCV: 97.6 fL (ref 80.0–100.0)
MONO ABS: 0.4 10*3/uL (ref 0.2–1.0)
Monocytes Relative: 5 %
NEUTROS ABS: 4.7 10*3/uL (ref 1.4–6.5)
Neutrophils Relative %: 72 %
Platelets: 99 10*3/uL — ABNORMAL LOW (ref 150–440)
RBC: 2.67 MIL/uL — AB (ref 4.40–5.90)
RDW: 20.4 % — ABNORMAL HIGH (ref 11.5–14.5)
WBC: 6.6 10*3/uL (ref 3.8–10.6)

## 2016-08-29 LAB — MAGNESIUM: Magnesium: 2.4 mg/dL (ref 1.7–2.4)

## 2016-08-29 LAB — SAMPLE TO BLOOD BANK

## 2016-09-04 ENCOUNTER — Inpatient Hospital Stay: Payer: Medicare Other

## 2016-09-04 ENCOUNTER — Inpatient Hospital Stay: Payer: Medicare Other | Attending: Internal Medicine

## 2016-09-04 DIAGNOSIS — R918 Other nonspecific abnormal finding of lung field: Secondary | ICD-10-CM | POA: Insufficient documentation

## 2016-09-04 DIAGNOSIS — N62 Hypertrophy of breast: Secondary | ICD-10-CM | POA: Diagnosis not present

## 2016-09-04 DIAGNOSIS — Z79899 Other long term (current) drug therapy: Secondary | ICD-10-CM | POA: Insufficient documentation

## 2016-09-04 DIAGNOSIS — Z8739 Personal history of other diseases of the musculoskeletal system and connective tissue: Secondary | ICD-10-CM | POA: Diagnosis not present

## 2016-09-04 DIAGNOSIS — Z923 Personal history of irradiation: Secondary | ICD-10-CM | POA: Insufficient documentation

## 2016-09-04 DIAGNOSIS — R0602 Shortness of breath: Secondary | ICD-10-CM | POA: Diagnosis not present

## 2016-09-04 DIAGNOSIS — C61 Malignant neoplasm of prostate: Secondary | ICD-10-CM | POA: Diagnosis not present

## 2016-09-04 DIAGNOSIS — D696 Thrombocytopenia, unspecified: Secondary | ICD-10-CM | POA: Diagnosis not present

## 2016-09-04 DIAGNOSIS — Z8781 Personal history of (healed) traumatic fracture: Secondary | ICD-10-CM | POA: Diagnosis not present

## 2016-09-04 DIAGNOSIS — Z800 Family history of malignant neoplasm of digestive organs: Secondary | ICD-10-CM | POA: Insufficient documentation

## 2016-09-04 DIAGNOSIS — K4020 Bilateral inguinal hernia, without obstruction or gangrene, not specified as recurrent: Secondary | ICD-10-CM | POA: Diagnosis not present

## 2016-09-04 DIAGNOSIS — R011 Cardiac murmur, unspecified: Secondary | ICD-10-CM | POA: Insufficient documentation

## 2016-09-04 DIAGNOSIS — D649 Anemia, unspecified: Secondary | ICD-10-CM | POA: Insufficient documentation

## 2016-09-04 DIAGNOSIS — M5137 Other intervertebral disc degeneration, lumbosacral region: Secondary | ICD-10-CM | POA: Insufficient documentation

## 2016-09-04 DIAGNOSIS — I700 Atherosclerosis of aorta: Secondary | ICD-10-CM | POA: Diagnosis not present

## 2016-09-04 DIAGNOSIS — D509 Iron deficiency anemia, unspecified: Secondary | ICD-10-CM | POA: Insufficient documentation

## 2016-09-04 DIAGNOSIS — R5383 Other fatigue: Secondary | ICD-10-CM | POA: Diagnosis not present

## 2016-09-04 DIAGNOSIS — M47816 Spondylosis without myelopathy or radiculopathy, lumbar region: Secondary | ICD-10-CM | POA: Diagnosis not present

## 2016-09-04 DIAGNOSIS — F17210 Nicotine dependence, cigarettes, uncomplicated: Secondary | ICD-10-CM | POA: Diagnosis not present

## 2016-09-04 DIAGNOSIS — E785 Hyperlipidemia, unspecified: Secondary | ICD-10-CM | POA: Diagnosis not present

## 2016-09-04 DIAGNOSIS — C7951 Secondary malignant neoplasm of bone: Principal | ICD-10-CM

## 2016-09-04 DIAGNOSIS — Z9221 Personal history of antineoplastic chemotherapy: Secondary | ICD-10-CM | POA: Diagnosis not present

## 2016-09-04 DIAGNOSIS — J45909 Unspecified asthma, uncomplicated: Secondary | ICD-10-CM | POA: Diagnosis not present

## 2016-09-04 DIAGNOSIS — Z79818 Long term (current) use of other agents affecting estrogen receptors and estrogen levels: Secondary | ICD-10-CM | POA: Diagnosis not present

## 2016-09-04 DIAGNOSIS — Z7952 Long term (current) use of systemic steroids: Secondary | ICD-10-CM | POA: Insufficient documentation

## 2016-09-04 LAB — CBC WITH DIFFERENTIAL/PLATELET
Basophils Absolute: 0 10*3/uL (ref 0–0.1)
Basophils Relative: 1 %
EOS PCT: 0 %
Eosinophils Absolute: 0 10*3/uL (ref 0–0.7)
HEMATOCRIT: 27.3 % — AB (ref 40.0–52.0)
HEMOGLOBIN: 9.3 g/dL — AB (ref 13.0–18.0)
LYMPHS ABS: 1.4 10*3/uL (ref 1.0–3.6)
LYMPHS PCT: 29 %
MCH: 33.3 pg (ref 26.0–34.0)
MCHC: 34.1 g/dL (ref 32.0–36.0)
MCV: 97.8 fL (ref 80.0–100.0)
MONOS PCT: 7 %
Monocytes Absolute: 0.3 10*3/uL (ref 0.2–1.0)
Neutro Abs: 3.1 10*3/uL (ref 1.4–6.5)
Neutrophils Relative %: 63 %
Platelets: 119 10*3/uL — ABNORMAL LOW (ref 150–440)
RBC: 2.79 MIL/uL — ABNORMAL LOW (ref 4.40–5.90)
RDW: 19.4 % — ABNORMAL HIGH (ref 11.5–14.5)
WBC: 4.8 10*3/uL (ref 3.8–10.6)

## 2016-09-04 LAB — COMPREHENSIVE METABOLIC PANEL
ALK PHOS: 236 U/L — AB (ref 38–126)
ALT: 15 U/L — AB (ref 17–63)
AST: 24 U/L (ref 15–41)
Albumin: 4.3 g/dL (ref 3.5–5.0)
Anion gap: 7 (ref 5–15)
BUN: 14 mg/dL (ref 6–20)
CALCIUM: 9 mg/dL (ref 8.9–10.3)
CO2: 23 mmol/L (ref 22–32)
CREATININE: 0.46 mg/dL — AB (ref 0.61–1.24)
Chloride: 105 mmol/L (ref 101–111)
Glucose, Bld: 116 mg/dL — ABNORMAL HIGH (ref 65–99)
Potassium: 4.8 mmol/L (ref 3.5–5.1)
Sodium: 135 mmol/L (ref 135–145)
Total Bilirubin: 0.8 mg/dL (ref 0.3–1.2)
Total Protein: 7.2 g/dL (ref 6.5–8.1)

## 2016-09-04 LAB — PSA: PSA: 2814 ng/mL — AB (ref 0.00–4.00)

## 2016-09-04 MED ORDER — DENOSUMAB 120 MG/1.7ML ~~LOC~~ SOLN
120.0000 mg | Freq: Once | SUBCUTANEOUS | Status: AC
  Administered 2016-09-04: 120 mg via SUBCUTANEOUS
  Filled 2016-09-04: qty 1.7

## 2016-09-05 ENCOUNTER — Inpatient Hospital Stay: Payer: Medicare Other

## 2016-09-05 DIAGNOSIS — C61 Malignant neoplasm of prostate: Secondary | ICD-10-CM | POA: Diagnosis not present

## 2016-09-05 DIAGNOSIS — C7951 Secondary malignant neoplasm of bone: Principal | ICD-10-CM

## 2016-09-05 MED ORDER — LEUPROLIDE ACETATE (3 MONTH) 22.5 MG IM KIT
22.5000 mg | PACK | Freq: Once | INTRAMUSCULAR | Status: AC
  Administered 2016-09-05: 22.5 mg via INTRAMUSCULAR
  Filled 2016-09-05: qty 22.5

## 2016-09-06 ENCOUNTER — Encounter
Admission: RE | Admit: 2016-09-06 | Discharge: 2016-09-06 | Disposition: A | Discharge status: Home / Self Care | Payer: Medicare Other | Source: Ambulatory Visit | Attending: Internal Medicine | Admitting: Internal Medicine

## 2016-09-06 ENCOUNTER — Ambulatory Visit
Admission: RE | Admit: 2016-09-06 | Discharge: 2016-09-06 | Disposition: A | Discharge status: Home / Self Care | Payer: Medicare Other | Source: Ambulatory Visit | Attending: Internal Medicine | Admitting: Internal Medicine

## 2016-09-06 DIAGNOSIS — R918 Other nonspecific abnormal finding of lung field: Secondary | ICD-10-CM | POA: Insufficient documentation

## 2016-09-06 DIAGNOSIS — I2510 Atherosclerotic heart disease of native coronary artery without angina pectoris: Secondary | ICD-10-CM | POA: Diagnosis not present

## 2016-09-06 DIAGNOSIS — M5136 Other intervertebral disc degeneration, lumbar region: Secondary | ICD-10-CM | POA: Insufficient documentation

## 2016-09-06 DIAGNOSIS — C7951 Secondary malignant neoplasm of bone: Principal | ICD-10-CM

## 2016-09-06 DIAGNOSIS — I700 Atherosclerosis of aorta: Secondary | ICD-10-CM | POA: Diagnosis not present

## 2016-09-06 DIAGNOSIS — C61 Malignant neoplasm of prostate: Secondary | ICD-10-CM | POA: Diagnosis present

## 2016-09-06 DIAGNOSIS — M479 Spondylosis, unspecified: Secondary | ICD-10-CM | POA: Diagnosis not present

## 2016-09-06 MED ORDER — TECHNETIUM TC 99M MEDRONATE IV KIT
25.0000 | PACK | Freq: Once | INTRAVENOUS | Status: AC | PRN
  Administered 2016-09-06: 23.42 via INTRAVENOUS

## 2016-09-06 MED ORDER — IOPAMIDOL (ISOVUE-300) INJECTION 61%
100.0000 mL | Freq: Once | INTRAVENOUS | Status: AC | PRN
  Administered 2016-09-06: 100 mL via INTRAVENOUS

## 2016-09-07 ENCOUNTER — Inpatient Hospital Stay (HOSPITAL_BASED_OUTPATIENT_CLINIC_OR_DEPARTMENT_OTHER): Payer: Medicare Other | Admitting: Internal Medicine

## 2016-09-07 VITALS — BP 113/67 | HR 106 | Temp 98.0°F | Wt 173.4 lb

## 2016-09-07 DIAGNOSIS — Z7952 Long term (current) use of systemic steroids: Secondary | ICD-10-CM

## 2016-09-07 DIAGNOSIS — C7951 Secondary malignant neoplasm of bone: Secondary | ICD-10-CM

## 2016-09-07 DIAGNOSIS — R011 Cardiac murmur, unspecified: Secondary | ICD-10-CM | POA: Diagnosis not present

## 2016-09-07 DIAGNOSIS — I700 Atherosclerosis of aorta: Secondary | ICD-10-CM

## 2016-09-07 DIAGNOSIS — Z9221 Personal history of antineoplastic chemotherapy: Secondary | ICD-10-CM

## 2016-09-07 DIAGNOSIS — Z79899 Other long term (current) drug therapy: Secondary | ICD-10-CM | POA: Diagnosis not present

## 2016-09-07 DIAGNOSIS — N62 Hypertrophy of breast: Secondary | ICD-10-CM

## 2016-09-07 DIAGNOSIS — D696 Thrombocytopenia, unspecified: Secondary | ICD-10-CM | POA: Diagnosis not present

## 2016-09-07 DIAGNOSIS — R0602 Shortness of breath: Secondary | ICD-10-CM | POA: Diagnosis not present

## 2016-09-07 DIAGNOSIS — C61 Malignant neoplasm of prostate: Secondary | ICD-10-CM

## 2016-09-07 DIAGNOSIS — Z8739 Personal history of other diseases of the musculoskeletal system and connective tissue: Secondary | ICD-10-CM

## 2016-09-07 DIAGNOSIS — D649 Anemia, unspecified: Secondary | ICD-10-CM

## 2016-09-07 DIAGNOSIS — Z800 Family history of malignant neoplasm of digestive organs: Secondary | ICD-10-CM

## 2016-09-07 DIAGNOSIS — Z8781 Personal history of (healed) traumatic fracture: Secondary | ICD-10-CM

## 2016-09-07 DIAGNOSIS — J45909 Unspecified asthma, uncomplicated: Secondary | ICD-10-CM

## 2016-09-07 DIAGNOSIS — Z923 Personal history of irradiation: Secondary | ICD-10-CM

## 2016-09-07 DIAGNOSIS — Z79818 Long term (current) use of other agents affecting estrogen receptors and estrogen levels: Secondary | ICD-10-CM | POA: Diagnosis not present

## 2016-09-07 DIAGNOSIS — M47816 Spondylosis without myelopathy or radiculopathy, lumbar region: Secondary | ICD-10-CM

## 2016-09-07 DIAGNOSIS — M5137 Other intervertebral disc degeneration, lumbosacral region: Secondary | ICD-10-CM

## 2016-09-07 DIAGNOSIS — F17210 Nicotine dependence, cigarettes, uncomplicated: Secondary | ICD-10-CM | POA: Diagnosis not present

## 2016-09-07 DIAGNOSIS — K4020 Bilateral inguinal hernia, without obstruction or gangrene, not specified as recurrent: Secondary | ICD-10-CM

## 2016-09-07 DIAGNOSIS — E785 Hyperlipidemia, unspecified: Secondary | ICD-10-CM | POA: Diagnosis not present

## 2016-09-07 DIAGNOSIS — R918 Other nonspecific abnormal finding of lung field: Secondary | ICD-10-CM

## 2016-09-07 NOTE — Progress Notes (Signed)
Preston Wilson OFFICE PROGRESS NOTE  Patient Care Team: Lenard Simmer, MD as PCP - General (Endocrinology)  Cancer Staging No matching staging information was found for the patient.   Oncology History   1. Carcinoma of prostate diagnosis in 2006.  Had received radiation therapy, external beam to prostate and pelvic lymph nodes.  Had Gleason 9 (4+5.)  PainBaseline PSA was 13. 2. Received Lupron injection after her radiation therapy also.  PSA was going up.  Last year or so received Lupron injection in September but did not have any followup.   3. Recently (this August, 2012) PSA was found to be 17. Patient was started on Lupron from September of 2012. 4. Patient has progressive disease by PSA criteria (October, 2013) 5. Patient was started on Zytiga and prednisone in November of 2013. 6.traumatic hip fracture ((right femur) status post internally fixed ,no evidence of metastatic disease(May of 2014) 7.bone scan in July of 2014 at outside institutions shows metastases to left scapula and left 11th rib/.. 8.progressive On ZYTIGA by PSA criteria.  9[name], September of 2015- Progression- Jan 2016  # AUG 11th 2017- Provenge 2 of planned 3 [sec to logistical issues]  # SEP 2017- START DOCETAXEL weekly; CT C/A/P-Progressive bone mets [compared to 2016 CT]PSA PROGRESSION; NOV 2017BONE SCAN STABLE  # NOV 21st 2017-  CABAZITAXEL q 3 W; 05/02/2016-Added Carboplatin with cycle #3 [given rising PSA]  # FEB 2018- GUARDIANT TESTING- AR-7/ no BRCA or targettable mutations.      Prostate cancer metastatic to bone ([place])   10/19/2015 Initial Diagnosis    Prostate cancer metastatic to bone [facility])        INTERVAL HISTORY:  Preston Wilson 81 y.o.  male pleasant patient above history of Metastatic prostate cancer status post multiple lines of therapy Currently cabazitaxel- [added carboplatin AUC 4 with cycle #3] Status post cycle # 8 is here for follow-up/ To review the  results of the restaging CAT scan and bone scan.  Patient has not needed any blood transfusions. No unusual shortness of breath or cough. He continues to deny any pain. Denies any loss of appetite. Denies any diarrhea. Denies any fevers. Denies any significant fatigue.  No diarrhea. No tingling or numbness. No fevers or chills. No cough. No swelling of the legs. No weight loss.  REVIEW OF SYSTEMS:  A complete 10 point review of system is done which is negative except mentioned above/history of present illness.   PAST MEDICAL HISTORY :  Past Medical History:  Diagnosis Date  . Asthma   . Heart murmur   . Hyperlipidemia   . Hypocalcemia   . Osteomyelitis ([place])   . Prostate cancer ([place])   . SOB (shortness of breath)     PAST SURGICAL HISTORY :   Past Surgical History:  Procedure Laterality Date  . COLONOSCOPY WITH PROPOFOL N/A 04/12/2015   Procedure: COLONOSCOPY WITH PROPOFOL;  Surgeon: Lollie Sails, MD;  Location: [facility] ENDOSCOPY;  Service: Endoscopy;  Laterality: N/A;  . EXCISIONAL HEMORRHOIDECTOMY    . TONSILLECTOMY      FAMILY HISTORY :   Family History  Problem Relation Age of Onset  . Colon cancer Father     deceased  . Prostate cancer Brother     deceased  . Other Child     neuroblastoma    SOCIAL HISTORY:   Social History  Substance Use Topics  . Smoking status: Current Every Day Smoker    Packs/day: 0.50  Types: Cigarettes  . Smokeless tobacco: Never Used  . Alcohol use No    ALLERGIES:  has No Known Allergies.  MEDICATIONS:  Current Outpatient Prescriptions  Medication Sig Dispense Refill  . ADVAIR DISKUS 100-50 MCG/DOSE AEPB inhale 1 dose by mouth twice a day  0  . calcium-vitamin D (OSCAL WITH D) 500-200 MG-UNIT per tablet Take 1 tablet by mouth daily with breakfast.     . Leuprolide Acetate (LUPRON IJ) Inject 1 mL as directed every 3 (three) months.    . Multiple Vitamin (MULTIVITAMIN) tablet Take 1 tablet by mouth daily.    . ondansetron  (ZOFRAN) 8 MG tablet 1 pill every 8 hours as needed for nausea/vomitting 40 tablet 1  . predniSONE (DELTASONE) 5 MG tablet Take 1 tablet (5 mg total) by mouth 2 (two) times daily with a meal. (Patient taking differently: Take 5 mg by mouth daily with breakfast. ) 60 tablet 3  . Vitamin D, Ergocalciferol, (DRISDOL) 50000 UNITS CAPS capsule Take 50,000 Units by mouth once a week. Fridays  0   No current facility-administered medications for this visit.     PHYSICAL EXAMINATION: ECOG PERFORMANCE STATUS: 0 - Asymptomatic  BP 113/67 (BP Location: Right Arm, Patient Position: Sitting)   Pulse (!) 106   Temp 98 F (36.7 C) (Tympanic)   Wt 173 lb 6 oz (78.6 kg)   BMI 23.51 kg/m   Filed Weights   09/07/16 1043  Weight: 173 lb 6 oz (78.6 kg)    GENERAL: Well-nourished well-developed; Alert, no distress and comfortable.   Accompanied by family. EYES: no pallor or icterus OROPHARYNX: no thrush or ulceration; good dentition  NECK: supple, no masses felt LYMPH:  no palpable lymphadenopathy in the cervical, axillary or inguinal regions LUNGS: clear to auscultation and  No wheeze or crackles HEART/CVS: regular rate & rhythm and positive for murmurs; No lower extremity edema ABDOMEN:abdomen soft, non-tender and normal bowel sounds Musculoskeletal:no cyanosis of digits and no clubbing  PSYCH: alert & oriented x 3 with fluent speech NEURO: no focal motor/sensory deficits SKIN:  no rashes or significant lesions  LABORATORY DATA:  I have reviewed the data as listed    Component Value Date/Time   NA 135 09/04/2016 1030   NA 138 08/25/2014 1442   K 4.8 09/04/2016 1030   K 4.1 08/25/2014 1442   CL 105 09/04/2016 1030   CL 106 08/25/2014 1442   CO2 23 09/04/2016 1030   CO2 25 08/25/2014 1442   GLUCOSE 116 (H) 09/04/2016 1030   GLUCOSE 138 (H) 08/25/2014 1442   BUN 14 09/04/2016 1030   BUN 19 08/25/2014 1442   CREATININE 0.46 (L) 09/04/2016 1030   CREATININE 0.53 (L) 08/25/2014 1442    CALCIUM 9.0 09/04/2016 1030   CALCIUM 9.0 08/25/2014 1442   PROT 7.2 09/04/2016 1030   PROT 6.8 08/25/2014 1442   ALBUMIN 4.3 09/04/2016 1030   ALBUMIN 3.9 08/25/2014 1442   AST 24 09/04/2016 1030   AST 18 08/25/2014 1442   ALT 15 (L) 09/04/2016 1030   ALT 13 (L) 08/25/2014 1442   ALKPHOS 236 (H) 09/04/2016 1030   ALKPHOS 73 08/25/2014 1442   BILITOT 0.8 09/04/2016 1030   BILITOT 0.7 08/25/2014 1442   GFRNONAA >60 09/04/2016 1030   GFRNONAA >60 08/25/2014 1442   GFRAA >60 09/04/2016 1030   GFRAA >60 08/25/2014 1442    No results found for: SPEP, UPEP  Lab Results  Component Value Date   WBC 4.8 09/04/2016  NEUTROABS 3.1 09/04/2016   HGB 9.3 (L) 09/04/2016   HCT 27.3 (L) 09/04/2016   MCV 97.8 09/04/2016   PLT 119 (L) 09/04/2016      Chemistry      Component Value Date/Time   NA 135 09/04/2016 1030   NA 138 08/25/2014 1442   K 4.8 09/04/2016 1030   K 4.1 08/25/2014 1442   CL 105 09/04/2016 1030   CL 106 08/25/2014 1442   CO2 23 09/04/2016 1030   CO2 25 08/25/2014 1442   BUN 14 09/04/2016 1030   BUN 19 08/25/2014 1442   CREATININE 0.46 (L) 09/04/2016 1030   CREATININE 0.53 (L) 08/25/2014 1442      Component Value Date/Time   CALCIUM 9.0 09/04/2016 1030   CALCIUM 9.0 08/25/2014 1442   ALKPHOS 236 (H) 09/04/2016 1030   ALKPHOS 73 08/25/2014 1442   AST 24 09/04/2016 1030   AST 18 08/25/2014 1442   ALT 15 (L) 09/04/2016 1030   ALT 13 (L) 08/25/2014 1442   BILITOT 0.8 09/04/2016 1030   BILITOT 0.7 08/25/2014 1442       Results for MATE, ALEGRIA (MRN [id number]) as of 09/07/2016 11:25  Ref. Range 01/11/2010 10:36 08/10/2010 10:16 04/27/2011 09:18 08/24/2011 09:27 01/11/2012 10:58 02/20/2012 10:27 04/24/2012 08:51 09/03/2012 10:26 10/10/2012 11:07 01/09/2013 11:28 02/20/2013 11:10 04/24/2013 10:54 07/03/2013 10:23 09/18/2013 08:38 12/18/2013 09:37 01/15/2014 09:56 03/05/2014 11:37 04/26/2014 10:55 07/29/2014 13:41 08/25/2014 14:42 09/22/2014 14:07 10/27/2014 14:08  11/24/2014 14:14 12/22/2014 14:49 01/19/2015 14:11 02/16/2015 14:08 04/11/2015 09:33 05/23/2015 11:56 06/21/2015 13:39 07/19/2015 13:55 08/16/2015 13:56 09/21/2015 14:30 10/19/2015 13:45 11/17/2015 13:35 12/12/2015 11:21 01/12/2016 13:00 01/20/2016 08:21 02/20/2016 08:57 03/12/2016 09:32 04/20/2016 10:25 05/25/2016 10:09 06/15/2016 09:08 07/04/2016 09:42 07/25/2016 10:00 08/15/2016 09:38 09/04/2016 10:30  PSA Latest Ref Range: 0.00 - 4.00 ng/mL ========== TEST N... ========== TEST N... 2.0 2.2 6.8 (H) 11.8 (H) 1.9 0.4 0.3 0.2 0.2 0.2 0.1 0.3 1.1 1.7 1.1 1.0 1.6 2.0 1.99 1.79 2.12 2.49 2.60 2.92 4.68 (H) 7.03 (H) 9.99 (H) 9.54 (H) 15.08 (H) 27.98 (H) 43.79 (H) 64.75 (H) 99.80 (H) 268.00 (H) 296.00 (H) 756.00 (H) 1,325.00 (H) 2,886.00 (H) 2,409.00 (H) 2,328.00 (H) 2,280.00 (H) 2,472.00 (H) 2,400.00 (H) 2,814.00 (H)     RADIOGRAPHIC STUDIES: I have personally reviewed the radiological images as listed and agreed with the findings in the report. Ct Chest W Contrast  Result Date: 09/06/2016 CLINICAL DATA:  Restaging prostate cancer metastatic to bone. EXAM: CT CHEST, ABDOMEN, AND PELVIS WITH CONTRAST TECHNIQUE: Multidetector CT imaging of the chest, abdomen and pelvis was performed following the standard protocol during bolus administration of intravenous contrast. CONTRAST:  188m ISOVUE-300 IOPAMIDOL (ISOVUE-300) INJECTION 61% COMPARISON:  Multiple exams, including examinations of 06/29/2016 FINDINGS: CT CHEST FINDINGS Cardiovascular: Coronary, aortic arch, and branch vessel atherosclerotic vascular disease. Mediastinum/Nodes: No pathologic adenopathy. Lungs/Pleura: Calcified pleural plaque along the posterior left hemithorax, no change. Several tiny pulmonary nodules demonstrate long-term stability and are considered benign. Musculoskeletal: Stable appearance of diffuse sclerotic metastatic disease in the visible axial and appendicular skeleton. No new fracture. Mild bilateral gynecomastia. CT ABDOMEN PELVIS FINDINGS Hepatobiliary:  Unremarkable Pancreas: Unremarkable Spleen: Unremarkable Adrenals/Urinary Tract: Stable 10 mm hypodense lesion in the right mid kidney, likely a cyst but technically too small to characterize. Similar lesion in the left kidney upper pole, not appreciably changed. Adrenal glands normal. Stomach/Bowel: Unremarkable Vascular/Lymphatic: Aortoiliac atherosclerotic vascular disease. No pathologic abdominal or pelvic adenopathy is observed. Reproductive: Small prostate gland with somewhat indistinct margins and left posterior eccentric calcifications.  Similar prior. Other: No supplemental non-categorized findings. Musculoskeletal: Right hip screw. Similar distribution of diffuse sclerotic metastatic disease in the visualized skeleton, the degree of sclerosis of the various lesions appears mildly increased, however this is nonspecific and could indicate a response to therapy. No specific new regions of involvement to suggest progression. Small bilateral direct inguinal hernias contain adipose tissue, unchanged from prior. Lumbar spondylosis and degenerative disc disease with impingement at L5-S1 and potentially on the left at L4-5. IMPRESSION: 1. Diffuse sclerotic metastatic disease in the skeleton has similar distribution to the prior exam, although appears slightly more sclerotic. This is nonspecific but can indicate a response to therapy. 2. No adenopathy or characteristic findings of extra osseous metastatic disease. 3. Long-term stability of several small pulmonary nodules, considered benign. 4.  Aortic Atherosclerosis (ICD10-I70.0).  Coronary atherosclerosis. 5. Lumbar spondylosis and degenerative disc disease with potential impingement at L4-5 at L5-S1. Electronically Signed   By: Van Clines M.D.   On: 09/06/2016 11:17   Nm Bone Scan Whole Body  Result Date: 09/06/2016 CLINICAL DATA:  Prostate cancer.  Metastatic disease. EXAM: NUCLEAR MEDICINE WHOLE BODY BONE SCAN TECHNIQUE: Whole body anterior and  posterior images were obtained approximately 3 hours after intravenous injection of radiopharmaceutical. RADIOPHARMACEUTICALS:  23.4 MCi Technetium-31mMDP IV COMPARISON:  06/29/2016. FINDINGS: Bilateral renal function excretion. Widespread increase activity is again noted throughout the axial and appendicular skeleton with abnormal activity noted in the skull, throughout the spine, sternum, both upper and lower extremities throughout. Findings again consistent widespread metastatic disease. IMPRESSION: Widespread metastatic disease without significant change from prior exam. Electronically Signed   By: TEast Barre  On: 09/06/2016 14:10   Ct Abdomen Pelvis W Contrast  Result Date: 09/06/2016 CLINICAL DATA:  Restaging prostate cancer metastatic to bone. EXAM: CT CHEST, ABDOMEN, AND PELVIS WITH CONTRAST TECHNIQUE: Multidetector CT imaging of the chest, abdomen and pelvis was performed following the standard protocol during bolus administration of intravenous contrast. CONTRAST:  1035mISOVUE-300 IOPAMIDOL (ISOVUE-300) INJECTION 61% COMPARISON:  Multiple exams, including examinations of 06/29/2016 FINDINGS: CT CHEST FINDINGS Cardiovascular: Coronary, aortic arch, and branch vessel atherosclerotic vascular disease. Mediastinum/Nodes: No pathologic adenopathy. Lungs/Pleura: Calcified pleural plaque along the posterior left hemithorax, no change. Several tiny pulmonary nodules demonstrate long-term stability and are considered benign. Musculoskeletal: Stable appearance of diffuse sclerotic metastatic disease in the visible axial and appendicular skeleton. No new fracture. Mild bilateral gynecomastia. CT ABDOMEN PELVIS FINDINGS Hepatobiliary: Unremarkable Pancreas: Unremarkable Spleen: Unremarkable Adrenals/Urinary Tract: Stable 10 mm hypodense lesion in the right mid kidney, likely a cyst but technically too small to characterize. Similar lesion in the left kidney upper pole, not appreciably changed. Adrenal  glands normal. Stomach/Bowel: Unremarkable Vascular/Lymphatic: Aortoiliac atherosclerotic vascular disease. No pathologic abdominal or pelvic adenopathy is observed. Reproductive: Small prostate gland with somewhat indistinct margins and left posterior eccentric calcifications. Similar prior. Other: No supplemental non-categorized findings. Musculoskeletal: Right hip screw. Similar distribution of diffuse sclerotic metastatic disease in the visualized skeleton, the degree of sclerosis of the various lesions appears mildly increased, however this is nonspecific and could indicate a response to therapy. No specific new regions of involvement to suggest progression. Small bilateral direct inguinal hernias contain adipose tissue, unchanged from prior. Lumbar spondylosis and degenerative disc disease with impingement at L5-S1 and potentially on the left at L4-5. IMPRESSION: 1. Diffuse sclerotic metastatic disease in the skeleton has similar distribution to the prior exam, although appears slightly more sclerotic. This is nonspecific but can indicate a response to therapy. 2.  No adenopathy or characteristic findings of extra osseous metastatic disease. 3. Long-term stability of several small pulmonary nodules, considered benign. 4.  Aortic Atherosclerosis (ICD10-I70.0).  Coronary atherosclerosis. 5. Lumbar spondylosis and degenerative disc disease with potential impingement at L4-5 at L5-S1. Electronically Signed   By: Van Clines M.D.   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 Carboplatin was added to Cabazitaxel with cycle 3. Currently status post 8 cycles- PSA slowly trended up to 2800/ from previous 2400. 09/05/2016 CT scan/bone scan shows stable/more sclerotic bone lesions. No evidence of any visceral metastases.    # Discussed with the patient/wife- as  his PSA continues to go up-given the absence of any visceral metastases- patient would be a good candidate for Xofigo. Discussed the mechanism of action; IV treatment every month for 6 times. Patient will be referred to Dr. Donella Stade. Also discussed. Would typically want hemoglobin above 10 prior to starting treatment. Spoke to [place] at [facility] oncology.   # Anemia hemoglobin 9.5; thrombocytopenia 119- likely from chemotherapy. Repeat CBC on a weekly basis x2-hopefully blood counts will improve prior to [facility] treatment.   # Rosalita Chessman 5/1]; plan x-geva at next visit;  Last Lupron q 77M- [last May 2nd];   # Follow-up in approximately 4 weeks; labs/ X Geva; weekly cbc x2.   # I reviewed the blood work- with the patient in detail; also reviewed the imaging independently [as summarized above]; and with the patient in detail. Also reviewed the note from Dr. Irene Shipper discussed with Dr.McNamara of the plan.   Orders Placed This Encounter  Procedures  . Comprehensive metabolic panel    Standing Status:   Future    Standing Expiration Date:   09/07/2017  . PSA    Standing Status:   Future    Standing Expiration Date:   09/07/2017     Cammie Sickle, MD 09/07/2016 1:23 PM

## 2016-09-07 NOTE — Assessment & Plan Note (Addendum)
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 Carboplatin was added to Cabazitaxel with cycle 3. Currently status post 8 cycles- PSA slowly trended up to 2800/ from previous 2400. 09/05/2016 CT scan/bone scan shows stable/more sclerotic bone lesions. No evidence of any visceral metastases.    # Discussed with the patient/wife- as his PSA continues to go up-given the absence of any visceral metastases- patient would be a good candidate for Xofigo. Discussed the mechanism of action; IV treatment every month for 6 times. Patient will be referred to Dr. Donella Stade. Also discussed. Would typically want hemoglobin above 10 prior to starting treatment. Spoke to [place] at [facility] oncology.   # Anemia hemoglobin 9.5; thrombocytopenia 119- likely from chemotherapy. Repeat CBC on a weekly basis x2-hopefully blood counts will improve prior to [facility] treatment.   # Rosalita Chessman 5/1]; plan x-geva at next visit;  Last Lupron q 69M- [last May 2nd];   # Follow-up in approximately 4 weeks; labs/ X Geva; weekly cbc x2.   # I reviewed the blood work- with the patient in detail; also reviewed the imaging independently [as summarized above]; and with the patient in detail. Also reviewed the note from Dr. Irene Shipper discussed with Dr.McNamara of the plan.

## 2016-09-07 NOTE — Progress Notes (Signed)
Patient here today for follow up.   

## 2016-09-10 ENCOUNTER — Ambulatory Visit
Admission: RE | Admit: 2016-09-10 | Discharge: 2016-09-10 | Disposition: A | Discharge status: Home / Self Care | Payer: Medicare Other | Source: Ambulatory Visit | Attending: Radiation Oncology | Admitting: Radiation Oncology

## 2016-09-10 ENCOUNTER — Encounter: Payer: Self-pay | Admitting: Radiation Oncology

## 2016-09-10 VITALS — BP 129/73 | HR 119 | Temp 98.0°F | Resp 18 | Wt 174.2 lb

## 2016-09-10 DIAGNOSIS — C61 Malignant neoplasm of prostate: Secondary | ICD-10-CM | POA: Diagnosis present

## 2016-09-10 DIAGNOSIS — Z923 Personal history of irradiation: Secondary | ICD-10-CM | POA: Diagnosis not present

## 2016-09-10 DIAGNOSIS — C7951 Secondary malignant neoplasm of bone: Secondary | ICD-10-CM

## 2016-09-10 NOTE — Progress Notes (Signed)
Radiation Oncology Follow up Note  Name: Preston Wilson   Date:   09/10/2016 MRN:  [id number] DOB: [birth year]    This 81 y.o. male presents to the clinic today for reevaluation for Xofigo.  REFERRING PROVIDER: Lenard Simmer, MD  HPI: Patient is a 81 year old male originally consult back in September 2017. He was well known to our department having received IM RT radiation therapy back in 2006 for Gleason 9 (4+5) adenocarcinoma the prostate receiving radiation therapy to both his prostate and pelvic nodes. He also had Lupron therapy at that time. In 2012 he had biochemical recurrence was started on intermittent Lupron therapy. Has been treated with Zytiga and prednisone as well as.Xtandi. He also has received Provenge. In September 2017 he started on Doxil Taxol and also has been treated with CABAZITAXEL as well as carboplatinum. Recent bone scan again shows widespread metastatic disease without significant change from prior examination. Medical oncology is asked to evaluate him to start Xofigo at this time. He is really having no significant pain at this time although he is on continuous prednisone.   COMPLICATIONS OF TREATMENT: none  FOLLOW UP COMPLIANCE: keeps appointments   PHYSICAL EXAM:  BP 129/73   Pulse (!) 119   Temp 98 F (36.7 C)   Resp 18   Wt 174 lb 2.6 oz (79 kg)   BMI 23.62 kg/m  Range of motion his lower extremities does not elicit pain. Deep palpation of his spine does not elicit pain. Well-developed well-nourished patient in NAD. HEENT reveals PERLA, EOMI, discs not visualized.  Oral cavity is clear. No oral mucosal lesions are identified. Neck is clear without evidence of cervical or supraclavicular adenopathy. Lungs are clear to A&P. Cardiac examination is essentially unremarkable with regular rate and rhythm without murmur rub or thrill. Abdomen is benign with no organomegaly or masses noted. Motor sensory and DTR levels are equal and symmetric in the  upper and lower extremities. Cranial nerves II through XII are grossly intact. Proprioception is intact. No peripheral adenopathy or edema is identified. No motor or sensory levels are noted. Crude visual fields are within normal range.  RADIOLOGY RESULTS: Bone scan is reviewed and compatible with the above-stated findings.  PLAN: At this time I to go ahead with 6 injections of macro X. Risks and benefits of treatment including bone marrow suppression allergic reaction and common side effects such as nausea and vomiting all were discussed in detail with the patient and his wife. We will arrange to nuclear medicine his first series of injections and monitor his blood counts. Patient wife both seem to comprehend my treatment plan well.  I would like to take this opportunity to thank you for allowing me to participate in the care of your patient.Armstead Peaks., MD

## 2016-09-14 ENCOUNTER — Inpatient Hospital Stay: Payer: Medicare Other

## 2016-09-14 DIAGNOSIS — C61 Malignant neoplasm of prostate: Secondary | ICD-10-CM

## 2016-09-14 DIAGNOSIS — C7951 Secondary malignant neoplasm of bone: Principal | ICD-10-CM

## 2016-09-14 LAB — CBC WITH DIFFERENTIAL/PLATELET
Basophils Absolute: 0 10*3/uL (ref 0–0.1)
Basophils Relative: 0 %
Eosinophils Absolute: 0 10*3/uL (ref 0–0.7)
Eosinophils Relative: 0 %
HEMATOCRIT: 24.2 % — AB (ref 40.0–52.0)
Hemoglobin: 8.5 g/dL — ABNORMAL LOW (ref 13.0–18.0)
LYMPHS PCT: 29 %
Lymphs Abs: 1.2 10*3/uL (ref 1.0–3.6)
MCH: 34.5 pg — ABNORMAL HIGH (ref 26.0–34.0)
MCHC: 35.2 g/dL (ref 32.0–36.0)
MCV: 98.1 fL (ref 80.0–100.0)
Monocytes Absolute: 0.3 10*3/uL (ref 0.2–1.0)
Monocytes Relative: 6 %
NEUTROS ABS: 2.6 10*3/uL (ref 1.4–6.5)
NEUTROS PCT: 65 %
Platelets: 130 10*3/uL — ABNORMAL LOW (ref 150–440)
RBC: 2.47 MIL/uL — AB (ref 4.40–5.90)
RDW: 19.1 % — ABNORMAL HIGH (ref 11.5–14.5)
WBC: 4 10*3/uL (ref 3.8–10.6)

## 2016-09-21 ENCOUNTER — Telehealth: Payer: Self-pay

## 2016-09-21 ENCOUNTER — Other Ambulatory Visit: Payer: Self-pay | Admitting: *Deleted

## 2016-09-21 ENCOUNTER — Inpatient Hospital Stay: Payer: Medicare Other

## 2016-09-21 ENCOUNTER — Other Ambulatory Visit: Payer: Self-pay

## 2016-09-21 DIAGNOSIS — C7951 Secondary malignant neoplasm of bone: Principal | ICD-10-CM

## 2016-09-21 DIAGNOSIS — C61 Malignant neoplasm of prostate: Secondary | ICD-10-CM

## 2016-09-21 LAB — CBC WITH DIFFERENTIAL/PLATELET
BASOS ABS: 0 10*3/uL (ref 0–0.1)
Basophils Relative: 0 %
Eosinophils Absolute: 0 10*3/uL (ref 0–0.7)
Eosinophils Relative: 0 %
HEMATOCRIT: 26.2 % — AB (ref 40.0–52.0)
HEMOGLOBIN: 9 g/dL — AB (ref 13.0–18.0)
LYMPHS PCT: 16 %
Lymphs Abs: 1 10*3/uL (ref 1.0–3.6)
MCH: 34.2 pg — ABNORMAL HIGH (ref 26.0–34.0)
MCHC: 34.6 g/dL (ref 32.0–36.0)
MCV: 98.9 fL (ref 80.0–100.0)
MONOS PCT: 6 %
Monocytes Absolute: 0.4 10*3/uL (ref 0.2–1.0)
NEUTROS ABS: 4.9 10*3/uL (ref 1.4–6.5)
NEUTROS PCT: 78 %
Platelets: 181 10*3/uL (ref 150–440)
RBC: 2.65 MIL/uL — ABNORMAL LOW (ref 4.40–5.90)
RDW: 18.1 % — ABNORMAL HIGH (ref 11.5–14.5)
WBC: 6.3 10*3/uL (ref 3.8–10.6)

## 2016-09-21 MED ORDER — PREDNISONE 5 MG PO TABS
5.0000 mg | ORAL_TABLET | Freq: Two times a day (BID) | ORAL | 3 refills | Status: DC
Start: 2016-09-21 — End: 2017-05-29

## 2016-09-21 NOTE — Telephone Encounter (Signed)
Spoke with Dr. Rogue Bussing - md approved prednisone 5 mg 1 tablet twice daily. 3 RFs.

## 2016-09-21 NOTE — Telephone Encounter (Signed)
Went to pharmacy and Rx for Prednisone has not been called in .  Please take care of this.

## 2016-09-25 ENCOUNTER — Other Ambulatory Visit: Payer: Self-pay | Admitting: *Deleted

## 2016-09-27 ENCOUNTER — Inpatient Hospital Stay: Payer: Medicare Other

## 2016-09-27 DIAGNOSIS — C61 Malignant neoplasm of prostate: Secondary | ICD-10-CM

## 2016-09-27 DIAGNOSIS — C7951 Secondary malignant neoplasm of bone: Principal | ICD-10-CM

## 2016-09-27 LAB — CBC WITH DIFFERENTIAL/PLATELET
BASOS ABS: 0 10*3/uL (ref 0–0.1)
Basophils Relative: 0 %
Eosinophils Absolute: 0 10*3/uL (ref 0–0.7)
Eosinophils Relative: 1 %
HCT: 27.2 % — ABNORMAL LOW (ref 40.0–52.0)
Hemoglobin: 9.3 g/dL — ABNORMAL LOW (ref 13.0–18.0)
LYMPHS ABS: 1.4 10*3/uL (ref 1.0–3.6)
LYMPHS PCT: 30 %
MCH: 34.2 pg — ABNORMAL HIGH (ref 26.0–34.0)
MCHC: 34.3 g/dL (ref 32.0–36.0)
MCV: 99.7 fL (ref 80.0–100.0)
Monocytes Absolute: 0.3 10*3/uL (ref 0.2–1.0)
Monocytes Relative: 6 %
NEUTROS ABS: 2.9 10*3/uL (ref 1.4–6.5)
Neutrophils Relative %: 63 %
PLATELETS: 160 10*3/uL (ref 150–440)
RBC: 2.73 MIL/uL — AB (ref 4.40–5.90)
RDW: 17.5 % — ABNORMAL HIGH (ref 11.5–14.5)
WBC: 4.7 10*3/uL (ref 3.8–10.6)

## 2016-09-28 ENCOUNTER — Other Ambulatory Visit: Payer: Self-pay | Admitting: *Deleted

## 2016-09-28 ENCOUNTER — Inpatient Hospital Stay: Payer: Medicare Other

## 2016-10-03 ENCOUNTER — Ambulatory Visit: Payer: Medicare Other | Admitting: Radiation Oncology

## 2016-10-04 ENCOUNTER — Telehealth: Payer: Self-pay | Admitting: Internal Medicine

## 2016-10-04 ENCOUNTER — Inpatient Hospital Stay: Payer: Medicare Other

## 2016-10-04 ENCOUNTER — Inpatient Hospital Stay (HOSPITAL_BASED_OUTPATIENT_CLINIC_OR_DEPARTMENT_OTHER): Payer: Medicare Other | Admitting: Internal Medicine

## 2016-10-04 VITALS — BP 108/62 | HR 96 | Temp 97.9°F | Resp 16 | Wt 172.5 lb

## 2016-10-04 DIAGNOSIS — J45909 Unspecified asthma, uncomplicated: Secondary | ICD-10-CM

## 2016-10-04 DIAGNOSIS — R5383 Other fatigue: Secondary | ICD-10-CM

## 2016-10-04 DIAGNOSIS — Z79899 Other long term (current) drug therapy: Secondary | ICD-10-CM | POA: Diagnosis not present

## 2016-10-04 DIAGNOSIS — C7951 Secondary malignant neoplasm of bone: Secondary | ICD-10-CM

## 2016-10-04 DIAGNOSIS — M5137 Other intervertebral disc degeneration, lumbosacral region: Secondary | ICD-10-CM

## 2016-10-04 DIAGNOSIS — R011 Cardiac murmur, unspecified: Secondary | ICD-10-CM | POA: Diagnosis not present

## 2016-10-04 DIAGNOSIS — N62 Hypertrophy of breast: Secondary | ICD-10-CM

## 2016-10-04 DIAGNOSIS — R0602 Shortness of breath: Secondary | ICD-10-CM

## 2016-10-04 DIAGNOSIS — C61 Malignant neoplasm of prostate: Secondary | ICD-10-CM

## 2016-10-04 DIAGNOSIS — I700 Atherosclerosis of aorta: Secondary | ICD-10-CM

## 2016-10-04 DIAGNOSIS — Z8781 Personal history of (healed) traumatic fracture: Secondary | ICD-10-CM

## 2016-10-04 DIAGNOSIS — Z923 Personal history of irradiation: Secondary | ICD-10-CM

## 2016-10-04 DIAGNOSIS — E785 Hyperlipidemia, unspecified: Secondary | ICD-10-CM

## 2016-10-04 DIAGNOSIS — Z800 Family history of malignant neoplasm of digestive organs: Secondary | ICD-10-CM

## 2016-10-04 DIAGNOSIS — Z79818 Long term (current) use of other agents affecting estrogen receptors and estrogen levels: Secondary | ICD-10-CM

## 2016-10-04 DIAGNOSIS — M47816 Spondylosis without myelopathy or radiculopathy, lumbar region: Secondary | ICD-10-CM

## 2016-10-04 DIAGNOSIS — D509 Iron deficiency anemia, unspecified: Secondary | ICD-10-CM

## 2016-10-04 DIAGNOSIS — D696 Thrombocytopenia, unspecified: Secondary | ICD-10-CM

## 2016-10-04 DIAGNOSIS — Z9221 Personal history of antineoplastic chemotherapy: Secondary | ICD-10-CM

## 2016-10-04 DIAGNOSIS — F17210 Nicotine dependence, cigarettes, uncomplicated: Secondary | ICD-10-CM

## 2016-10-04 DIAGNOSIS — Z8739 Personal history of other diseases of the musculoskeletal system and connective tissue: Secondary | ICD-10-CM

## 2016-10-04 DIAGNOSIS — K4020 Bilateral inguinal hernia, without obstruction or gangrene, not specified as recurrent: Secondary | ICD-10-CM

## 2016-10-04 DIAGNOSIS — Z7952 Long term (current) use of systemic steroids: Secondary | ICD-10-CM

## 2016-10-04 DIAGNOSIS — R918 Other nonspecific abnormal finding of lung field: Secondary | ICD-10-CM

## 2016-10-04 LAB — CBC WITH DIFFERENTIAL/PLATELET
Basophils Absolute: 0 K/uL (ref 0–0.1)
Basophils Relative: 1 %
Eosinophils Absolute: 0 K/uL (ref 0–0.7)
Eosinophils Relative: 0 %
HCT: 26.7 % — ABNORMAL LOW (ref 40.0–52.0)
Hemoglobin: 9.3 g/dL — ABNORMAL LOW (ref 13.0–18.0)
Lymphocytes Relative: 35 %
Lymphs Abs: 1.5 K/uL (ref 1.0–3.6)
MCH: 34.7 pg — ABNORMAL HIGH (ref 26.0–34.0)
MCHC: 34.9 g/dL (ref 32.0–36.0)
MCV: 99.3 fL (ref 80.0–100.0)
Monocytes Absolute: 0.2 K/uL (ref 0.2–1.0)
Monocytes Relative: 6 %
Neutro Abs: 2.6 K/uL (ref 1.4–6.5)
Neutrophils Relative %: 58 %
Platelets: 162 K/uL (ref 150–440)
RBC: 2.68 MIL/uL — ABNORMAL LOW (ref 4.40–5.90)
RDW: 16.4 % — ABNORMAL HIGH (ref 11.5–14.5)
WBC: 4.4 K/uL (ref 3.8–10.6)

## 2016-10-04 LAB — COMPREHENSIVE METABOLIC PANEL WITH GFR
ALT: 12 U/L — ABNORMAL LOW (ref 17–63)
AST: 20 U/L (ref 15–41)
Albumin: 3.9 g/dL (ref 3.5–5.0)
Alkaline Phosphatase: 253 U/L — ABNORMAL HIGH (ref 38–126)
Anion gap: 5 (ref 5–15)
BUN: 18 mg/dL (ref 6–20)
CO2: 25 mmol/L (ref 22–32)
Calcium: 9.4 mg/dL (ref 8.9–10.3)
Chloride: 105 mmol/L (ref 101–111)
Creatinine, Ser: 0.58 mg/dL — ABNORMAL LOW (ref 0.61–1.24)
GFR calc Af Amer: >60 mL/min (ref >60)
GFR calc non Af Amer: >60 mL/min (ref >60)
Glucose, Bld: 118 mg/dL — ABNORMAL HIGH (ref 65–99)
Potassium: 4.5 mmol/L (ref 3.5–5.1)
Sodium: 135 mmol/L (ref 135–145)
Total Bilirubin: 0.5 mg/dL (ref 0.3–1.2)
Total Protein: 6.9 g/dL (ref 6.5–8.1)

## 2016-10-04 LAB — IRON AND TIBC
Iron: 37 ug/dL — ABNORMAL LOW (ref 45–182)
SATURATION RATIOS: 12 % — AB (ref 17.9–39.5)
TIBC: 298 ug/dL (ref 250–450)
UIBC: 262 ug/dL

## 2016-10-04 LAB — FERRITIN: Ferritin: 196 ng/mL (ref 24–336)

## 2016-10-04 MED ORDER — DENOSUMAB 120 MG/1.7ML ~~LOC~~ SOLN
120.0000 mg | Freq: Once | SUBCUTANEOUS | Status: AC
  Administered 2016-10-04: 120 mg via SUBCUTANEOUS
  Filled 2016-10-04: qty 1.7

## 2016-10-04 NOTE — Progress Notes (Signed)
Patient here today for follow up.   

## 2016-10-04 NOTE — Progress Notes (Signed)
[place] OFFICE PROGRESS NOTE  Patient Care Team: Morayati, Lourdes Sledge, MD as PCP - General (Endocrinology)  Cancer Staging No matching staging information was found for the patient.   Oncology History   1. Carcinoma of prostate diagnosis in 2006.  Had received radiation therapy, external beam to prostate and pelvic lymph nodes.  Had Gleason 9 (4+5.)  PainBaseline PSA was 13. 2. Received Lupron injection after her radiation therapy also.  PSA was going up.  Last year or so received Lupron injection in September but did not have any followup.   3. Recently (this August, 2012) PSA was found to be 17. Patient was started on Lupron from September of 2012. 4. Patient has progressive disease by PSA criteria (October, 2013) 5. Patient was started on Zytiga and prednisone in November of 2013. 6.traumatic hip fracture ((right femur) status post internally fixed ,no evidence of metastatic disease(May of 2014) 7.bone scan in July of 2014 at outside institutions shows metastases to left scapula and left 11th rib/.. 8.progressive On ZYTIGA by PSA criteria.  9[name], September of 2015- Progression- Jan 2016  # AUG 11th 2017- Provenge 2 of planned 3 [sec to logistical issues]  # SEP 2017- START DOCETAXEL weekly; CT C/A/P-Progressive bone mets [compared to 2016 CT]PSA PROGRESSION; NOV 2017BONE SCAN STABLE  # NOV 21st 2017-  CABAZITAXEL q 3 W; 05/02/2016-Added Carboplatin with cycle #3 [given rising PSA]  # FEB 2018- GUARDIANT TESTING- AR-7/ no BRCA or targettable mutations.      Prostate cancer metastatic to bone [facility])   10/19/2015 Initial Diagnosis    Prostate cancer metastatic to bone [facility])        INTERVAL HISTORY:  Preston Wilson 81 y.o.  male pleasant patient above history of Metastatic prostate cancer status post multiple lines of therapy Most recently status post cabazitaxel- [added carboplatin AUC 4 with cycle #3] Status post cycle # 8 is here for  follow-up. May 2018 the CT scan shows- bone only disease; no evidence of visceral metastases. PSA plateaued 2400-2800.   In the interim patient was referred to radiation oncology to start xofigo. However this was not started because of hemoglobin less than 10.  Wife/patient is very concerned about inability to start Xofigo given low hemoglobin.   Patient complains of fatigue. He continues to deny any pain. Denies any loss of appetite. Denies any diarrhea.  No diarrhea. No tingling or numbness. No fevers or chills. No cough. No swelling of the legs. No weight loss.  REVIEW OF SYSTEMS:  A complete 10 point review of system is done which is negative except mentioned above/history of present illness.   PAST MEDICAL HISTORY :  Past Medical History:  Diagnosis Date  . Asthma   . Heart murmur   . Hyperlipidemia   . Hypocalcemia   . Osteomyelitis ([place])   . Prostate cancer ([place])   . SOB (shortness of breath)     PAST SURGICAL HISTORY :   Past Surgical History:  Procedure Laterality Date  . COLONOSCOPY WITH PROPOFOL N/A 04/12/2015   Procedure: COLONOSCOPY WITH PROPOFOL;  Surgeon: Lollie Sails, MD;  Location: [facility] ENDOSCOPY;  Service: Endoscopy;  Laterality: N/A;  . EXCISIONAL HEMORRHOIDECTOMY    . TONSILLECTOMY      FAMILY HISTORY :   Family History  Problem Relation Age of Onset  . Colon cancer Father        deceased  . Prostate cancer Brother        deceased  .  Other Child        neuroblastoma    SOCIAL HISTORY:   Social History  Substance Use Topics  . Smoking status: Current Every Day Smoker    Packs/day: 0.50    Types: Cigarettes  . Smokeless tobacco: Never Used  . Alcohol use No    ALLERGIES:  has No Known Allergies.  MEDICATIONS:  Current Outpatient Prescriptions  Medication Sig Dispense Refill  . ADVAIR DISKUS 100-50 MCG/DOSE AEPB inhale 1 dose by mouth twice a day  0  . calcium-vitamin D (OSCAL WITH D) 500-200 MG-UNIT per tablet Take 1 tablet by mouth daily  with breakfast.     . ferrous sulfate 325 (65 FE) MG tablet Take 325 mg by mouth daily with breakfast.    . Leuprolide Acetate (LUPRON IJ) Inject 1 mL as directed every 3 (three) months.    . Multiple Vitamin (MULTIVITAMIN) tablet Take 1 tablet by mouth daily.    . ondansetron (ZOFRAN) 8 MG tablet 1 pill every 8 hours as needed for nausea/vomitting 40 tablet 1  . predniSONE (DELTASONE) 5 MG tablet Take 1 tablet (5 mg total) by mouth 2 (two) times daily with a meal. 60 tablet 3  . Vitamin D, Ergocalciferol, (DRISDOL) 50000 UNITS CAPS capsule Take 50,000 Units by mouth once a week. Fridays  0   No current facility-administered medications for this visit.     PHYSICAL EXAMINATION: ECOG PERFORMANCE STATUS: 0 - Asymptomatic  BP 108/62 (BP Location: Right Arm, Patient Position: Sitting)   Pulse 96   Temp 97.9 F (36.6 C) (Tympanic)   Resp 16   Wt 172 lb 8 oz (78.2 kg)   BMI 23.40 kg/m   Filed Weights   10/04/16 1345  Weight: 172 lb 8 oz (78.2 kg)    GENERAL: Well-nourished well-developed; Alert, no distress and comfortable.   Accompanied by family. EYES: no pallor or icterus OROPHARYNX: no thrush or ulceration; good dentition  NECK: supple, no masses felt LYMPH:  no palpable lymphadenopathy in the cervical, axillary or inguinal regions LUNGS: clear to auscultation and  No wheeze or crackles HEART/CVS: regular rate & rhythm and positive for murmurs; No lower extremity edema ABDOMEN:abdomen soft, non-tender and normal bowel sounds Musculoskeletal:no cyanosis of digits and no clubbing  PSYCH: alert & oriented x 3 with fluent speech NEURO: no focal motor/sensory deficits SKIN:  no rashes or significant lesions  LABORATORY DATA:  I have reviewed the data as listed    Component Value Date/Time   NA 135 10/04/2016 1310   NA 138 08/25/2014 1442   K 4.5 10/04/2016 1310   K 4.1 08/25/2014 1442   CL 105 10/04/2016 1310   CL 106 08/25/2014 1442   CO2 25 10/04/2016 1310   CO2 25  08/25/2014 1442   GLUCOSE 118 (H) 10/04/2016 1310   GLUCOSE 138 (H) 08/25/2014 1442   BUN 18 10/04/2016 1310   BUN 19 08/25/2014 1442   CREATININE 0.58 (L) 10/04/2016 1310   CREATININE 0.53 (L) 08/25/2014 1442   CALCIUM 9.4 10/04/2016 1310   CALCIUM 9.0 08/25/2014 1442   PROT 6.9 10/04/2016 1310   PROT 6.8 08/25/2014 1442   ALBUMIN 3.9 10/04/2016 1310   ALBUMIN 3.9 08/25/2014 1442   AST 20 10/04/2016 1310   AST 18 08/25/2014 1442   ALT 12 (L) 10/04/2016 1310   ALT 13 (L) 08/25/2014 1442   ALKPHOS 253 (H) 10/04/2016 1310   ALKPHOS 73 08/25/2014 1442   BILITOT 0.5 10/04/2016 1310   BILITOT 0.7  08/25/2014 1442   GFRNONAA >60 10/04/2016 1310   GFRNONAA >60 08/25/2014 1442   GFRAA >60 10/04/2016 1310   GFRAA >60 08/25/2014 1442    No results found for: SPEP, UPEP  Lab Results  Component Value Date   WBC 4.4 10/04/2016   NEUTROABS 2.6 10/04/2016   HGB 9.3 (L) 10/04/2016   HCT 26.7 (L) 10/04/2016   MCV 99.3 10/04/2016   PLT 162 10/04/2016      Chemistry      Component Value Date/Time   NA 135 10/04/2016 1310   NA 138 08/25/2014 1442   K 4.5 10/04/2016 1310   K 4.1 08/25/2014 1442   CL 105 10/04/2016 1310   CL 106 08/25/2014 1442   CO2 25 10/04/2016 1310   CO2 25 08/25/2014 1442   BUN 18 10/04/2016 1310   BUN 19 08/25/2014 1442   CREATININE 0.58 (L) 10/04/2016 1310   CREATININE 0.53 (L) 08/25/2014 1442      Component Value Date/Time   CALCIUM 9.4 10/04/2016 1310   CALCIUM 9.0 08/25/2014 1442   ALKPHOS 253 (H) 10/04/2016 1310   ALKPHOS 73 08/25/2014 1442   AST 20 10/04/2016 1310   AST 18 08/25/2014 1442   ALT 12 (L) 10/04/2016 1310   ALT 13 (L) 08/25/2014 1442   BILITOT 0.5 10/04/2016 1310   BILITOT 0.7 08/25/2014 1442       Results for RAGE, BEEVER (MRN [id number]) as of 09/07/2016 11:25  Ref. Range 01/11/2010 10:36 08/10/2010 10:16 04/27/2011 09:18 08/24/2011 09:27 01/11/2012 10:58 02/20/2012 10:27 04/24/2012 08:51 09/03/2012 10:26 10/10/2012 11:07  01/09/2013 11:28 02/20/2013 11:10 04/24/2013 10:54 07/03/2013 10:23 09/18/2013 08:38 12/18/2013 09:37 01/15/2014 09:56 03/05/2014 11:37 04/26/2014 10:55 07/29/2014 13:41 08/25/2014 14:42 09/22/2014 14:07 10/27/2014 14:08 11/24/2014 14:14 12/22/2014 14:49 01/19/2015 14:11 02/16/2015 14:08 04/11/2015 09:33 05/23/2015 11:56 06/21/2015 13:39 07/19/2015 13:55 08/16/2015 13:56 09/21/2015 14:30 10/19/2015 13:45 11/17/2015 13:35 12/12/2015 11:21 01/12/2016 13:00 01/20/2016 08:21 02/20/2016 08:57 03/12/2016 09:32 04/20/2016 10:25 05/25/2016 10:09 06/15/2016 09:08 07/04/2016 09:42 07/25/2016 10:00 08/15/2016 09:38 09/04/2016 10:30  PSA Latest Ref Range: 0.00 - 4.00 ng/mL ========== TEST N... ========== TEST N... 2.0 2.2 6.8 (H) 11.8 (H) 1.9 0.4 0.3 0.2 0.2 0.2 0.1 0.3 1.1 1.7 1.1 1.0 1.6 2.0 1.99 1.79 2.12 2.49 2.60 2.92 4.68 (H) 7.03 (H) 9.99 (H) 9.54 (H) 15.08 (H) 27.98 (H) 43.79 (H) 64.75 (H) 99.80 (H) 268.00 (H) 296.00 (H) 756.00 (H) 1,325.00 (H) 2,886.00 (H) 2,409.00 (H) 2,328.00 (H) 2,280.00 (H) 2,472.00 (H) 2,400.00 (H) 2,814.00 (H)     RADIOGRAPHIC STUDIES: I have personally reviewed the radiological images as listed and agreed with the findings in the report. No results found.   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 Carboplatin was added to Cabazitaxel with cycle 3. Currently status post 8 cycles- PSA slowly trended up to 2800/ from previous 2400. 09/05/2016 CT scan/bone scan shows stable/more sclerotic bone lesions. No evidence of any visceral metastases.    # Patient unable to receive Xofigo- because of hemoglobin less than 10. Hemoglobin is around 9.3 currently. Discussed with Dr.crystal.   # Anemia hemoglobin 9.3- from chemotherapy. Recommend checking iron studies today. Also recommend adding Aranesp every 2 weeks. Discussed with the patient/ wife regarding infusion reactions from IV iron. Also discussed  regarding the small concern of increased risk of progression of malignancy on Aranesp.   # Proceed with X-geva today [5/31]; Last Lupron q 92M- [last May 2nd];   #  Follow-up in approximately 2 weeks/Dr.Finegan [as per family request].   Addendum: Iron saturations 12%; ferritin normal. Recommend Venofer weekly 4. Also recommend adding Aranesp- boost the hemoglobin about 10- so the patient can be candidate for Xofigo.   Orders Placed This Encounter  Procedures  . Iron and TIBC    Standing Status:   Future    Number of Occurrences:   1    Standing Expiration Date:   11/08/2017  . Ferritin    Standing Status:   Future    Number of Occurrences:   1    Standing Expiration Date:   11/08/2017     Cammie Sickle, MD 10/04/2016 8:08 PM

## 2016-10-04 NOTE — Assessment & Plan Note (Addendum)
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 Carboplatin was added to Cabazitaxel with cycle 3. Currently status post 8 cycles- PSA slowly trended up to 2800/ from previous 2400. 09/05/2016 CT scan/bone scan shows stable/more sclerotic bone lesions. No evidence of any visceral metastases.    # Patient unable to receive Xofigo- because of hemoglobin less than 10. Hemoglobin is around 9.3 currently. Discussed with Dr.crystal.   # Anemia hemoglobin 9.3- from chemotherapy. Recommend checking iron studies today. Also recommend adding Aranesp every 2 weeks. Discussed with the patient/ wife regarding infusion reactions from IV iron. Also discussed regarding the small concern of increased risk of progression of malignancy on Aranesp.   # Proceed with X-geva today [5/31]; Last Lupron q 61M- [last May 2nd];   # Follow-up in approximately 2 weeks/Dr.Finegan [as per family request].   Addendum: Iron saturations 12%; ferritin normal. Recommend Venofer weekly 4. Also recommend adding Aranesp- boost the hemoglobin about 10- so the patient can be candidate for Xofigo.

## 2016-10-04 NOTE — Telephone Encounter (Signed)
H/J- Please inform patient that iron studies are low- recommend IV Venofer on 6/04; and schedule for aranesp [q 2w] on 6/05. Continue IV venofer as planned.  Dr.Finnegan can decide/schedule aranesp when he sees the pt on the 6/14th.

## 2016-10-05 ENCOUNTER — Telehealth: Payer: Self-pay | Admitting: *Deleted

## 2016-10-05 LAB — PSA: PSA: 2151 ng/mL — AB (ref 0.00–4.00)

## 2016-10-05 NOTE — Telephone Encounter (Signed)
Left msg for patient's wife-requesting a call back to discuss results. Also sending mychart msg to pt.

## 2016-10-05 NOTE — Telephone Encounter (Addendum)
Preston Wilson, pt on schedule for possible IV venofer vs aransep on 6/4.  Pt will receive IV venofer on 6/4, and will need an additional apt for 6/5.    Dr. Grayland Ormond will decided on 6/14 to determine if aranesp is needed this day.  Please inform wife of additional apt.

## 2016-10-05 NOTE — Telephone Encounter (Signed)
-----   Message from Cammie Sickle, MD sent at 10/05/2016 12:58 PM EDT ----- Please inform patient that PSAs improving/ plan- awaiting improvement of hemoglobin for xofigo.

## 2016-10-08 ENCOUNTER — Other Ambulatory Visit: Payer: Self-pay | Admitting: Radiation Oncology

## 2016-10-08 ENCOUNTER — Inpatient Hospital Stay: Payer: Medicare Other

## 2016-10-08 ENCOUNTER — Inpatient Hospital Stay: Payer: Medicare Other | Attending: Oncology

## 2016-10-08 VITALS — BP 145/79 | HR 103 | Temp 97.0°F | Resp 18

## 2016-10-08 DIAGNOSIS — Z8042 Family history of malignant neoplasm of prostate: Secondary | ICD-10-CM | POA: Insufficient documentation

## 2016-10-08 DIAGNOSIS — D649 Anemia, unspecified: Secondary | ICD-10-CM | POA: Diagnosis not present

## 2016-10-08 DIAGNOSIS — D696 Thrombocytopenia, unspecified: Secondary | ICD-10-CM | POA: Diagnosis not present

## 2016-10-08 DIAGNOSIS — J45909 Unspecified asthma, uncomplicated: Secondary | ICD-10-CM | POA: Insufficient documentation

## 2016-10-08 DIAGNOSIS — C61 Malignant neoplasm of prostate: Secondary | ICD-10-CM

## 2016-10-08 DIAGNOSIS — Z800 Family history of malignant neoplasm of digestive organs: Secondary | ICD-10-CM | POA: Diagnosis not present

## 2016-10-08 DIAGNOSIS — C7951 Secondary malignant neoplasm of bone: Principal | ICD-10-CM

## 2016-10-08 DIAGNOSIS — R1011 Right upper quadrant pain: Secondary | ICD-10-CM | POA: Insufficient documentation

## 2016-10-08 DIAGNOSIS — F17210 Nicotine dependence, cigarettes, uncomplicated: Secondary | ICD-10-CM | POA: Diagnosis not present

## 2016-10-08 DIAGNOSIS — E785 Hyperlipidemia, unspecified: Secondary | ICD-10-CM | POA: Insufficient documentation

## 2016-10-08 DIAGNOSIS — Z8781 Personal history of (healed) traumatic fracture: Secondary | ICD-10-CM | POA: Insufficient documentation

## 2016-10-08 DIAGNOSIS — Z79899 Other long term (current) drug therapy: Secondary | ICD-10-CM | POA: Insufficient documentation

## 2016-10-08 LAB — CBC WITH DIFFERENTIAL/PLATELET
Basophils Absolute: 0 10*3/uL (ref 0–0.1)
Basophils Relative: 0 %
EOS ABS: 0 10*3/uL (ref 0–0.7)
EOS PCT: 0 %
HCT: 26 % — ABNORMAL LOW (ref 40.0–52.0)
HEMOGLOBIN: 9.1 g/dL — AB (ref 13.0–18.0)
Lymphocytes Relative: 39 %
Lymphs Abs: 1.4 10*3/uL (ref 1.0–3.6)
MCH: 34.6 pg — ABNORMAL HIGH (ref 26.0–34.0)
MCHC: 35 g/dL (ref 32.0–36.0)
MCV: 99 fL (ref 80.0–100.0)
Monocytes Absolute: 0.2 10*3/uL (ref 0.2–1.0)
Monocytes Relative: 5 %
NEUTROS PCT: 56 %
Neutro Abs: 2 10*3/uL (ref 1.4–6.5)
PLATELETS: 165 10*3/uL (ref 150–440)
RBC: 2.63 MIL/uL — AB (ref 4.40–5.90)
RDW: 16.4 % — ABNORMAL HIGH (ref 11.5–14.5)
WBC: 3.7 10*3/uL — AB (ref 3.8–10.6)

## 2016-10-08 MED ORDER — IRON SUCROSE 20 MG/ML IV SOLN
200.0000 mg | Freq: Once | INTRAVENOUS | Status: AC
  Administered 2016-10-08: 200 mg via INTRAVENOUS
  Filled 2016-10-08: qty 10

## 2016-10-08 MED ORDER — SODIUM CHLORIDE 0.9 % IV SOLN
Freq: Once | INTRAVENOUS | Status: AC
  Administered 2016-10-08: 14:00:00 via INTRAVENOUS
  Filled 2016-10-08: qty 1000

## 2016-10-08 MED ORDER — SODIUM CHLORIDE 0.9 % IV SOLN: 200.0000 mg | Freq: Once | INTRAVENOUS | Status: DC

## 2016-10-09 ENCOUNTER — Inpatient Hospital Stay: Payer: Medicare Other

## 2016-10-09 VITALS — BP 131/66

## 2016-10-09 DIAGNOSIS — C7951 Secondary malignant neoplasm of bone: Principal | ICD-10-CM

## 2016-10-09 DIAGNOSIS — C61 Malignant neoplasm of prostate: Secondary | ICD-10-CM

## 2016-10-09 MED ORDER — DARBEPOETIN ALFA 300 MCG/0.6ML IJ SOSY
300.0000 ug | PREFILLED_SYRINGE | Freq: Once | INTRAMUSCULAR | Status: AC
  Administered 2016-10-09: 300 ug via SUBCUTANEOUS
  Filled 2016-10-09: qty 0.6

## 2016-10-11 ENCOUNTER — Ambulatory Visit: Payer: Self-pay

## 2016-10-11 ENCOUNTER — Inpatient Hospital Stay: Payer: Medicare Other

## 2016-10-11 ENCOUNTER — Other Ambulatory Visit: Payer: Self-pay

## 2016-10-11 ENCOUNTER — Encounter: Payer: Self-pay | Admitting: *Deleted

## 2016-10-11 DIAGNOSIS — C7951 Secondary malignant neoplasm of bone: Principal | ICD-10-CM

## 2016-10-11 DIAGNOSIS — C61 Malignant neoplasm of prostate: Secondary | ICD-10-CM

## 2016-10-11 LAB — CBC WITH DIFFERENTIAL/PLATELET
Basophils Absolute: 0 10*3/uL (ref 0–0.1)
Basophils Relative: 0 %
Eosinophils Absolute: 0 10*3/uL (ref 0–0.7)
Eosinophils Relative: 0 %
HCT: 27 % — ABNORMAL LOW (ref 40.0–52.0)
HEMOGLOBIN: 9.4 g/dL — AB (ref 13.0–18.0)
LYMPHS ABS: 1.4 10*3/uL (ref 1.0–3.6)
LYMPHS PCT: 36 %
MCH: 34.7 pg — AB (ref 26.0–34.0)
MCHC: 34.9 g/dL (ref 32.0–36.0)
MCV: 99.3 fL (ref 80.0–100.0)
Monocytes Absolute: 0.3 10*3/uL (ref 0.2–1.0)
Monocytes Relative: 8 %
NEUTROS ABS: 2.1 10*3/uL (ref 1.4–6.5)
NEUTROS PCT: 56 %
Platelets: 168 10*3/uL (ref 150–440)
RBC: 2.72 MIL/uL — ABNORMAL LOW (ref 4.40–5.90)
RDW: 16.1 % — ABNORMAL HIGH (ref 11.5–14.5)
WBC: 3.9 10*3/uL (ref 3.8–10.6)

## 2016-10-17 ENCOUNTER — Ambulatory Visit
Admission: RE | Admit: 2016-10-17 | Discharge: 2016-10-17 | Disposition: A | Discharge status: Home / Self Care | Payer: Medicare Other | Source: Ambulatory Visit | Attending: Radiation Oncology | Admitting: Radiation Oncology

## 2016-10-17 ENCOUNTER — Ambulatory Visit: Payer: Medicare Other | Admitting: Radiation Oncology

## 2016-10-17 DIAGNOSIS — C7951 Secondary malignant neoplasm of bone: Secondary | ICD-10-CM | POA: Insufficient documentation

## 2016-10-17 DIAGNOSIS — C61 Malignant neoplasm of prostate: Secondary | ICD-10-CM | POA: Insufficient documentation

## 2016-10-17 MED ORDER — RADIUM RA 223 DICHLORIDE 30 MCCI/ML IV SOLN
119.0000 | Freq: Once | INTRAVENOUS | Status: AC
  Administered 2016-10-17: 119 via INTRAVENOUS

## 2016-10-18 ENCOUNTER — Inpatient Hospital Stay: Payer: Medicare Other

## 2016-10-18 ENCOUNTER — Inpatient Hospital Stay (HOSPITAL_BASED_OUTPATIENT_CLINIC_OR_DEPARTMENT_OTHER): Payer: Medicare Other | Admitting: Hematology and Oncology

## 2016-10-18 VITALS — BP 124/69 | HR 106 | Temp 98.4°F | Resp 18 | Wt 174.1 lb

## 2016-10-18 DIAGNOSIS — R1011 Right upper quadrant pain: Secondary | ICD-10-CM

## 2016-10-18 DIAGNOSIS — D696 Thrombocytopenia, unspecified: Secondary | ICD-10-CM | POA: Diagnosis not present

## 2016-10-18 DIAGNOSIS — D649 Anemia, unspecified: Secondary | ICD-10-CM

## 2016-10-18 DIAGNOSIS — C7951 Secondary malignant neoplasm of bone: Secondary | ICD-10-CM

## 2016-10-18 DIAGNOSIS — F17210 Nicotine dependence, cigarettes, uncomplicated: Secondary | ICD-10-CM

## 2016-10-18 DIAGNOSIS — C61 Malignant neoplasm of prostate: Secondary | ICD-10-CM

## 2016-10-18 DIAGNOSIS — Z79899 Other long term (current) drug therapy: Secondary | ICD-10-CM

## 2016-10-18 DIAGNOSIS — Z8781 Personal history of (healed) traumatic fracture: Secondary | ICD-10-CM

## 2016-10-18 DIAGNOSIS — E785 Hyperlipidemia, unspecified: Secondary | ICD-10-CM

## 2016-10-18 DIAGNOSIS — Z800 Family history of malignant neoplasm of digestive organs: Secondary | ICD-10-CM | POA: Diagnosis not present

## 2016-10-18 DIAGNOSIS — Z8042 Family history of malignant neoplasm of prostate: Secondary | ICD-10-CM | POA: Diagnosis not present

## 2016-10-18 LAB — CBC WITH DIFFERENTIAL/PLATELET
BASOS ABS: 0 10*3/uL (ref 0–0.1)
BASOS PCT: 1 %
EOS ABS: 0 10*3/uL (ref 0–0.7)
EOS PCT: 0 %
HEMATOCRIT: 27.6 % — AB (ref 40.0–52.0)
Hemoglobin: 9.6 g/dL — ABNORMAL LOW (ref 13.0–18.0)
Lymphocytes Relative: 28 %
Lymphs Abs: 1.1 10*3/uL (ref 1.0–3.6)
MCH: 34.6 pg — ABNORMAL HIGH (ref 26.0–34.0)
MCHC: 34.9 g/dL (ref 32.0–36.0)
MCV: 99.2 fL (ref 80.0–100.0)
MONO ABS: 0.3 10*3/uL (ref 0.2–1.0)
MONOS PCT: 9 %
Neutro Abs: 2.5 10*3/uL (ref 1.4–6.5)
Neutrophils Relative %: 62 %
PLATELETS: 157 10*3/uL (ref 150–440)
RBC: 2.78 MIL/uL — ABNORMAL LOW (ref 4.40–5.90)
RDW: 16.4 % — AB (ref 11.5–14.5)
WBC: 4 10*3/uL (ref 3.8–10.6)

## 2016-10-18 LAB — FERRITIN: Ferritin: 245 ng/mL (ref 24–336)

## 2016-10-18 MED ORDER — SODIUM CHLORIDE 0.9% FLUSH
10.0000 mL | INTRAVENOUS | Status: DC | PRN
  Filled 2016-10-18: qty 10

## 2016-10-18 MED ORDER — HEPARIN SOD (PORK) LOCK FLUSH 100 UNIT/ML IV SOLN: 500.0000 [IU] | Freq: Once | INTRAVENOUS | Status: DC | PRN

## 2016-10-18 MED ORDER — SODIUM CHLORIDE 0.9 % IV SOLN
Freq: Once | INTRAVENOUS | Status: AC
  Administered 2016-10-18: 10:00:00 via INTRAVENOUS
  Filled 2016-10-18: qty 1000

## 2016-10-18 MED ORDER — IRON SUCROSE 20 MG/ML IV SOLN
200.0000 mg | Freq: Once | INTRAVENOUS | Status: AC
  Administered 2016-10-18: 200 mg via INTRAVENOUS
  Filled 2016-10-18: qty 10

## 2016-10-18 MED ORDER — SODIUM CHLORIDE 0.9 % IV SOLN: 200.0000 mg | Freq: Once | INTRAVENOUS | Status: DC

## 2016-10-18 NOTE — Progress Notes (Signed)
Patient of Dr. Jacinto Reap.  Patient offers no concerns today.

## 2016-10-18 NOTE — Progress Notes (Signed)
[place] OFFICE PROGRESS NOTE  Patient Care Team: Morayati, Lourdes Sledge, MD as PCP - General (Endocrinology)  Cancer Staging No matching staging information was found for the patient.   Oncology History   1. Carcinoma of prostate diagnosis in 2006.  Had received radiation therapy, external beam to prostate and pelvic lymph nodes.  Had Gleason 9 (4+5.)  PainBaseline PSA was 13. 2. Received Lupron injection after her radiation therapy also.  PSA was going up.  Last year or so received Lupron injection in September but did not have any followup.   3. Recently (this August, 2012) PSA was found to be 17. Patient was started on Lupron from September of 2012. 4. Patient has progressive disease by PSA criteria (October, 2013) 5. Patient was started on Zytiga and prednisone in November of 2013. 6.traumatic hip fracture ((right femur) status post internally fixed ,no evidence of metastatic disease(May of 2014) 7.bone scan in July of 2014 at outside institutions shows metastases to left scapula and left 11th rib/.. 8.progressive On ZYTIGA by PSA criteria.  9[name], September of 2015- Progression- Jan 2016  # AUG 11th 2017- Provenge 2 of planned 3 [sec to logistical issues]  # SEP 2017- START DOCETAXEL weekly; CT C/A/P-Progressive bone mets [compared to 2016 CT]PSA PROGRESSION; NOV 2017BONE SCAN STABLE  # NOV 21st 2017-  CABAZITAXEL q 3 W; 05/02/2016-Added Carboplatin with cycle #3 [given rising PSA]  # FEB 2018- GUARDIANT TESTING- AR-7/ no BRCA or targettable mutations.      Prostate cancer metastatic to bone [facility])   10/19/2015 Initial Diagnosis    Prostate cancer metastatic to bone [facility])        INTERVAL HISTORY:  Preston Wilson 81 y.o.  male pleasant patient above history of Metastatic prostate cancer status post multiple lines of therapy Most recently status post cabazitaxel- [added carboplatin AUC 4 with cycle #3] Status post cycle # 8 is here for  follow-up. May 2018 the CT scan shows- bone only disease; no evidence of visceral metastases. PSA plateaued 2400-2800.   He received Xgeva on 10/04/2016.  He received Venofer on 10/08/2016.  He received Aranesp on 10/09/2016.  The patient was referred to radiation oncology to start [facility]. The patient states that he was approved for [facility] and was started yesterday.  Patient denies any pain.  He has shortness of breath on exertion. He denies any loss of appetite. Denies any diarrhea.  No tingling or numbness. No fevers or chills. No cough. No swelling of the legs. No weight loss.  REVIEW OF SYSTEMS:  A complete 10 point review of system is done which is negative except mentioned above/history of present illness.   PAST MEDICAL HISTORY :  Past Medical History:  Diagnosis Date  . Asthma   . Heart murmur   . Hyperlipidemia   . Hypocalcemia   . Osteomyelitis ([place])   . Prostate cancer ([place])   . SOB (shortness of breath)     PAST SURGICAL HISTORY :   Past Surgical History:  Procedure Laterality Date  . COLONOSCOPY WITH PROPOFOL N/A 04/12/2015   Procedure: COLONOSCOPY WITH PROPOFOL;  Surgeon: Lollie Sails, MD;  Location: [facility] ENDOSCOPY;  Service: Endoscopy;  Laterality: N/A;  . EXCISIONAL HEMORRHOIDECTOMY    . TONSILLECTOMY      FAMILY HISTORY :   Family History  Problem Relation Age of Onset  . Colon cancer Father        deceased  . Prostate cancer Brother  deceased  . Other Child        neuroblastoma    SOCIAL HISTORY:   Social History  Substance Use Topics  . Smoking status: Current Every Day Smoker    Packs/day: 0.50    Types: Cigarettes  . Smokeless tobacco: Never Used  . Alcohol use No    ALLERGIES:  has No Known Allergies.  MEDICATIONS:  Current Outpatient Prescriptions  Medication Sig Dispense Refill  . ADVAIR DISKUS 100-50 MCG/DOSE AEPB inhale 1 dose by mouth twice a day  0  . calcium-vitamin D (OSCAL WITH D) 500-200 MG-UNIT per tablet Take 1  tablet by mouth daily with breakfast.     . ferrous sulfate 325 (65 FE) MG tablet Take 325 mg by mouth daily with breakfast.    . Leuprolide Acetate (LUPRON IJ) Inject 1 mL as directed every 3 (three) months.    . Multiple Vitamin (MULTIVITAMIN) tablet Take 1 tablet by mouth daily.    . ondansetron (ZOFRAN) 8 MG tablet 1 pill every 8 hours as needed for nausea/vomitting 40 tablet 1  . predniSONE (DELTASONE) 5 MG tablet Take 1 tablet (5 mg total) by mouth 2 (two) times daily with a meal. 60 tablet 3  . Vitamin D, Ergocalciferol, (DRISDOL) 50000 UNITS CAPS capsule Take 50,000 Units by mouth once a week. Fridays  0   No current facility-administered medications for this visit.     PHYSICAL EXAMINATION: ECOG PERFORMANCE STATUS: 0 - Asymptomatic  BP 124/69 (BP Location: Left Arm, Patient Position: Sitting)   Pulse (!) 106   Temp 98.4 F (36.9 C) (Tympanic)   Resp 18   Wt 174 lb 2 oz (79 kg)   BMI 23.62 kg/m   Filed Weights   10/18/16 0925  Weight: 174 lb 2 oz (79 kg)    GENERAL:  Thin gentleman sitting comfortably in the exam room in no acute distress.  He is accompanied by his wife. MENTAL STATUS:  Alert and oriented to person, place and time. HEAD:  Normocephalic, atraumatic, face symmetric, no Cushingoid features. EYES:  Blue eyes.  Pupils equal round and reactive to light and accomodation.  No conjunctivitis or scleral icterus. ENT:  Oropharynx clear without lesion.  Tongue normal. Mucous membranes moist.  RESPIRATORY:  Clear to auscultation without rales, wheezes or rhonchi. CARDIOVASCULAR:  Regular rate and rhythm without murmur, rub or gallop. ABDOMEN:  Soft, non-tender, with active bowel sounds, and no hepatosplenomegaly.  No masses. SKIN:  Pale.  No rashes, ulcers or lesions. EXTREMITIES: No edema, no skin discoloration or tenderness.  No palpable cords. LYMPH NODES: No palpable cervical, supraclavicular, axillary or inguinal adenopathy  NEUROLOGICAL: Unremarkable. PSYCH:   Appropriate.   LABORATORY DATA:  I have reviewed the data as listed    Component Value Date/Time   NA 135 10/04/2016 1310   NA 138 08/25/2014 1442   K 4.5 10/04/2016 1310   K 4.1 08/25/2014 1442   CL 105 10/04/2016 1310   CL 106 08/25/2014 1442   CO2 25 10/04/2016 1310   CO2 25 08/25/2014 1442   GLUCOSE 118 (H) 10/04/2016 1310   GLUCOSE 138 (H) 08/25/2014 1442   BUN 18 10/04/2016 1310   BUN 19 08/25/2014 1442   CREATININE 0.58 (L) 10/04/2016 1310   CREATININE 0.53 (L) 08/25/2014 1442   CALCIUM 9.4 10/04/2016 1310   CALCIUM 9.0 08/25/2014 1442   PROT 6.9 10/04/2016 1310   PROT 6.8 08/25/2014 1442   ALBUMIN 3.9 10/04/2016 1310   ALBUMIN 3.9 08/25/2014 1442  AST 20 10/04/2016 1310   AST 18 08/25/2014 1442   ALT 12 (L) 10/04/2016 1310   ALT 13 (L) 08/25/2014 1442   ALKPHOS 253 (H) 10/04/2016 1310   ALKPHOS 73 08/25/2014 1442   BILITOT 0.5 10/04/2016 1310   BILITOT 0.7 08/25/2014 1442   GFRNONAA >60 10/04/2016 1310   GFRNONAA >60 08/25/2014 1442   GFRAA >60 10/04/2016 1310   GFRAA >60 08/25/2014 1442    No results found for: SPEP, UPEP  Lab Results  Component Value Date   WBC 4.0 10/18/2016   NEUTROABS 2.5 10/18/2016   HGB 9.6 (L) 10/18/2016   HCT 27.6 (L) 10/18/2016   MCV 99.2 10/18/2016   PLT 157 10/18/2016      Chemistry      Component Value Date/Time   NA 135 10/04/2016 1310   NA 138 08/25/2014 1442   K 4.5 10/04/2016 1310   K 4.1 08/25/2014 1442   CL 105 10/04/2016 1310   CL 106 08/25/2014 1442   CO2 25 10/04/2016 1310   CO2 25 08/25/2014 1442   BUN 18 10/04/2016 1310   BUN 19 08/25/2014 1442   CREATININE 0.58 (L) 10/04/2016 1310   CREATININE 0.53 (L) 08/25/2014 1442      Component Value Date/Time   CALCIUM 9.4 10/04/2016 1310   CALCIUM 9.0 08/25/2014 1442   ALKPHOS 253 (H) 10/04/2016 1310   ALKPHOS 73 08/25/2014 1442   AST 20 10/04/2016 1310   AST 18 08/25/2014 1442   ALT 12 (L) 10/04/2016 1310   ALT 13 (L) 08/25/2014 1442   BILITOT 0.5  10/04/2016 1310   BILITOT 0.7 08/25/2014 1442       Results for BECKEM, TOMBERLIN (MRN [id number]) as of 09/07/2016 11:25  Ref. Range 01/11/2010 10:36 08/10/2010 10:16 04/27/2011 09:18 08/24/2011 09:27 01/11/2012 10:58 02/20/2012 10:27 04/24/2012 08:51 09/03/2012 10:26 10/10/2012 11:07 01/09/2013 11:28 02/20/2013 11:10 04/24/2013 10:54 07/03/2013 10:23 09/18/2013 08:38 12/18/2013 09:37 01/15/2014 09:56 03/05/2014 11:37 04/26/2014 10:55 07/29/2014 13:41 08/25/2014 14:42 09/22/2014 14:07 10/27/2014 14:08 11/24/2014 14:14 12/22/2014 14:49 01/19/2015 14:11 02/16/2015 14:08 04/11/2015 09:33 05/23/2015 11:56 06/21/2015 13:39 07/19/2015 13:55 08/16/2015 13:56 09/21/2015 14:30 10/19/2015 13:45 11/17/2015 13:35 12/12/2015 11:21 01/12/2016 13:00 01/20/2016 08:21 02/20/2016 08:57 03/12/2016 09:32 04/20/2016 10:25 05/25/2016 10:09 06/15/2016 09:08 07/04/2016 09:42 07/25/2016 10:00 08/15/2016 09:38 09/04/2016 10:30  PSA Latest Ref Range: 0.00 - 4.00 ng/mL ========== TEST N... ========== TEST N... 2.0 2.2 6.8 (H) 11.8 (H) 1.9 0.4 0.3 0.2 0.2 0.2 0.1 0.3 1.1 1.7 1.1 1.0 1.6 2.0 1.99 1.79 2.12 2.49 2.60 2.92 4.68 (H) 7.03 (H) 9.99 (H) 9.54 (H) 15.08 (H) 27.98 (H) 43.79 (H) 64.75 (H) 99.80 (H) 268.00 (H) 296.00 (H) 756.00 (H) 1,325.00 (H) 2,886.00 (H) 2,409.00 (H) 2,328.00 (H) 2,280.00 (H) 2,472.00 (H) 2,400.00 (H) 2,814.00 (H)     RADIOGRAPHIC STUDIES: I have personally reviewed the radiological images as listed and agreed with the findings in the report. 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 Carboplatin was added to Cabazitaxel with cycle 3. Currently status post 8 cycles- PSA slowly trended up to 2800/ from previous 2400. 09/05/2016 CT scan/bone scan shows stable/more  sclerotic bone lesions. No evidence of any visceral metastases.    # Patient received Xofigo on 10/17/2016.  Initially held as hemoglobin less than 10.  Hemoglobin is around 9.6 currently.    # Anemia hemoglobin 9.6- from chemotherapy. Aranesp every 2 weeks (received 10/09/2016). Patient also received Venofer on 10/09/2016.  Venofer today.  Add ferritin to today's labs to assess iron stores.  RTC next week as scheduled for labs (CBC with diff, ferritin, sed rate) and +/- Venofer.  # Continue monthly X-geva today [last given 5/31]; Last Lupron q 41M- [last 05/02];   # Return to clinic as scheduled to follow-up with Dr. Rogue Bussing.   No orders of the defined types were placed in this encounter.    Lequita Asal, MD 10/18/2016 9:45 AM

## 2016-10-19 ENCOUNTER — Inpatient Hospital Stay: Payer: Medicare Other

## 2016-10-19 VITALS — BP 127/66 | HR 93

## 2016-10-19 DIAGNOSIS — C61 Malignant neoplasm of prostate: Secondary | ICD-10-CM

## 2016-10-19 DIAGNOSIS — C7951 Secondary malignant neoplasm of bone: Principal | ICD-10-CM

## 2016-10-19 MED ORDER — DARBEPOETIN ALFA 300 MCG/0.6ML IJ SOSY
300.0000 ug | PREFILLED_SYRINGE | Freq: Once | INTRAMUSCULAR | Status: AC
  Administered 2016-10-19: 300 ug via SUBCUTANEOUS
  Filled 2016-10-19: qty 0.6

## 2016-10-25 ENCOUNTER — Other Ambulatory Visit: Payer: Self-pay

## 2016-10-25 ENCOUNTER — Inpatient Hospital Stay: Payer: Medicare Other

## 2016-10-25 DIAGNOSIS — D649 Anemia, unspecified: Secondary | ICD-10-CM

## 2016-10-25 DIAGNOSIS — C61 Malignant neoplasm of prostate: Secondary | ICD-10-CM

## 2016-10-25 DIAGNOSIS — C7951 Secondary malignant neoplasm of bone: Principal | ICD-10-CM

## 2016-10-25 LAB — CBC WITH DIFFERENTIAL/PLATELET
BASOS ABS: 0 10*3/uL (ref 0–0.1)
Basophils Relative: 0 %
Eosinophils Absolute: 0 10*3/uL (ref 0–0.7)
Eosinophils Relative: 0 %
HCT: 29.7 % — ABNORMAL LOW (ref 40.0–52.0)
Hemoglobin: 10.2 g/dL — ABNORMAL LOW (ref 13.0–18.0)
LYMPHS PCT: 36 %
Lymphs Abs: 1.4 10*3/uL (ref 1.0–3.6)
MCH: 34.3 pg — ABNORMAL HIGH (ref 26.0–34.0)
MCHC: 34.5 g/dL (ref 32.0–36.0)
MCV: 99.6 fL (ref 80.0–100.0)
Monocytes Absolute: 0.3 10*3/uL (ref 0.2–1.0)
Monocytes Relative: 7 %
Neutro Abs: 2.2 10*3/uL (ref 1.4–6.5)
Neutrophils Relative %: 57 %
Platelets: 171 10*3/uL (ref 150–440)
RBC: 2.99 MIL/uL — AB (ref 4.40–5.90)
RDW: 16.1 % — ABNORMAL HIGH (ref 11.5–14.5)
WBC: 3.8 10*3/uL (ref 3.8–10.6)

## 2016-10-25 LAB — SEDIMENTATION RATE: Sed Rate: 63 mm/hr — ABNORMAL HIGH (ref 0–20)

## 2016-10-25 LAB — FERRITIN: Ferritin: 319 ng/mL (ref 24–336)

## 2016-10-31 ENCOUNTER — Ambulatory Visit: Payer: Self-pay | Admitting: Radiation Oncology

## 2016-11-01 ENCOUNTER — Inpatient Hospital Stay: Payer: Medicare Other

## 2016-11-01 ENCOUNTER — Inpatient Hospital Stay (HOSPITAL_BASED_OUTPATIENT_CLINIC_OR_DEPARTMENT_OTHER): Payer: Medicare Other | Admitting: Internal Medicine

## 2016-11-01 VITALS — BP 109/63 | HR 103 | Temp 97.8°F | Wt 171.5 lb

## 2016-11-01 DIAGNOSIS — C61 Malignant neoplasm of prostate: Secondary | ICD-10-CM

## 2016-11-01 DIAGNOSIS — R1011 Right upper quadrant pain: Secondary | ICD-10-CM

## 2016-11-01 DIAGNOSIS — C7951 Secondary malignant neoplasm of bone: Principal | ICD-10-CM

## 2016-11-01 DIAGNOSIS — J45909 Unspecified asthma, uncomplicated: Secondary | ICD-10-CM | POA: Diagnosis not present

## 2016-11-01 DIAGNOSIS — D696 Thrombocytopenia, unspecified: Secondary | ICD-10-CM | POA: Diagnosis not present

## 2016-11-01 DIAGNOSIS — Z8042 Family history of malignant neoplasm of prostate: Secondary | ICD-10-CM

## 2016-11-01 DIAGNOSIS — Z800 Family history of malignant neoplasm of digestive organs: Secondary | ICD-10-CM

## 2016-11-01 DIAGNOSIS — Z79899 Other long term (current) drug therapy: Secondary | ICD-10-CM

## 2016-11-01 DIAGNOSIS — D649 Anemia, unspecified: Secondary | ICD-10-CM

## 2016-11-01 DIAGNOSIS — Z8781 Personal history of (healed) traumatic fracture: Secondary | ICD-10-CM | POA: Diagnosis not present

## 2016-11-01 DIAGNOSIS — E785 Hyperlipidemia, unspecified: Secondary | ICD-10-CM

## 2016-11-01 DIAGNOSIS — F17210 Nicotine dependence, cigarettes, uncomplicated: Secondary | ICD-10-CM

## 2016-11-01 LAB — COMPREHENSIVE METABOLIC PANEL
ALT: 13 U/L — ABNORMAL LOW (ref 17–63)
ANION GAP: 7 (ref 5–15)
AST: 23 U/L (ref 15–41)
Albumin: 4.2 g/dL (ref 3.5–5.0)
Alkaline Phosphatase: 181 U/L — ABNORMAL HIGH (ref 38–126)
BUN: 14 mg/dL (ref 6–20)
CHLORIDE: 103 mmol/L (ref 101–111)
CO2: 26 mmol/L (ref 22–32)
CREATININE: 0.61 mg/dL (ref 0.61–1.24)
Calcium: 9.3 mg/dL (ref 8.9–10.3)
Glucose, Bld: 113 mg/dL — ABNORMAL HIGH (ref 65–99)
Potassium: 4.2 mmol/L (ref 3.5–5.1)
SODIUM: 136 mmol/L (ref 135–145)
Total Bilirubin: 0.7 mg/dL (ref 0.3–1.2)
Total Protein: 7 g/dL (ref 6.5–8.1)

## 2016-11-01 LAB — CBC WITH DIFFERENTIAL/PLATELET
Basophils Absolute: 0 10*3/uL (ref 0–0.1)
Basophils Relative: 1 %
EOS PCT: 1 %
Eosinophils Absolute: 0 10*3/uL (ref 0–0.7)
HEMATOCRIT: 30.9 % — AB (ref 40.0–52.0)
Hemoglobin: 10.6 g/dL — ABNORMAL LOW (ref 13.0–18.0)
Lymphocytes Relative: 32 %
Lymphs Abs: 1.1 10*3/uL (ref 1.0–3.6)
MCH: 34 pg (ref 26.0–34.0)
MCHC: 34.3 g/dL (ref 32.0–36.0)
MCV: 99.1 fL (ref 80.0–100.0)
MONO ABS: 0.3 10*3/uL (ref 0.2–1.0)
Monocytes Relative: 9 %
NEUTROS ABS: 1.9 10*3/uL (ref 1.4–6.5)
Neutrophils Relative %: 57 %
Platelets: 136 10*3/uL — ABNORMAL LOW (ref 150–440)
RBC: 3.12 MIL/uL — AB (ref 4.40–5.90)
RDW: 15.6 % — ABNORMAL HIGH (ref 11.5–14.5)
WBC: 3.4 10*3/uL — AB (ref 3.8–10.6)

## 2016-11-01 LAB — PSA: PROSTATIC SPECIFIC ANTIGEN: 1998 ng/mL — AB (ref 0.00–4.00)

## 2016-11-01 MED ORDER — DENOSUMAB 120 MG/1.7ML ~~LOC~~ SOLN
120.0000 mg | Freq: Once | SUBCUTANEOUS | Status: AC
  Administered 2016-11-01: 120 mg via SUBCUTANEOUS
  Filled 2016-11-01: qty 1.7

## 2016-11-01 NOTE — Progress Notes (Signed)
[place] OFFICE PROGRESS NOTE  Patient Care Team: Morayati, Lourdes Sledge, MD as PCP - General (Endocrinology)  Cancer Staging No matching staging information was found for the patient.   Oncology History   1. Carcinoma of prostate diagnosis in 2006.  Had received radiation therapy, external beam to prostate and pelvic lymph nodes.  Had Gleason 9 (4+5.)  PainBaseline PSA was 13. 2. Received Lupron injection after her radiation therapy also.  PSA was going up.  Last year or so received Lupron injection in September but did not have any followup.   3. Recently (this August, 2012) PSA was found to be 17. Patient was started on Lupron from September of 2012. 4. Patient has progressive disease by PSA criteria (October, 2013) 5. Patient was started on Zytiga and prednisone in November of 2013. 6.traumatic hip fracture ((right femur) status post internally fixed ,no evidence of metastatic disease(May of 2014) 7.bone scan in July of 2014 at outside institutions shows metastases to left scapula and left 11th rib/.. 8.progressive On ZYTIGA by PSA criteria.  9Gillermina Wilson, September of 2015- Progression- Jan 2016  # AUG 11th 2017- Provenge 2 of planned 3 [sec to logistical issues]  # SEP 2017- START DOCETAXEL weekly; CT C/A/P-Progressive bone mets [compared to 2016 CT]PSA PROGRESSION; NOV 2017BONE SCAN STABLE  # NOV 21st 2017-  CABAZITAXEL q 3 W; 05/02/2016-Added Carboplatin with cycle #3 [given rising PSA]   # June 13th XOFIGO #1  # Anemia on aranesp/IV iron [end of may 2018]  # FEB 2018- GUARDIANT TESTING- AR-7/ no BRCA or targettable mutations.      Prostate cancer metastatic to bone [facility])     INTERVAL HISTORY:  Preston Wilson 81 y.o.  male pleasant patient above history of Metastatic prostate cancer status post multiple lines of therapy Currently on xofigo is here for follow-up. Patient received cycle #1 approximately on June 13.  Patient's xofigo- was  delayed because of hemoglobin less than 10. Patient received Aranesp and IV iron infusion- in the hemoglobin stayed about 10 he received cycle #1  He tolerated treatment well. No nausea no vomiting.  Patient complains of  Mild fatigue. He continues to deny any pain. Denies any loss of appetite. No diarrhea. No tingling or numbness. No fevers or chills. No cough. No swelling of the legs. No weight loss.  REVIEW OF SYSTEMS:  A complete 10 point review of system is done which is negative except mentioned above/history of present illness.   PAST MEDICAL HISTORY :  Past Medical History:  Diagnosis Date  . Asthma   . Heart murmur   . Hyperlipidemia   . Hypocalcemia   . Osteomyelitis ([place])   . Prostate cancer ([place])   . SOB (shortness of breath)     PAST SURGICAL HISTORY :   Past Surgical History:  Procedure Laterality Date  . COLONOSCOPY WITH PROPOFOL N/A 04/12/2015   Procedure: COLONOSCOPY WITH PROPOFOL;  Surgeon: Lollie Sails, MD;  Location: [facility] END[facility]COPY;  Service: Endoscopy;  Laterality: N/A;  . EXCISIONAL HEMORRHOIDECTOMY    . TONSILLECTOMY      FAMILY HISTORY :   Family History  Problem Relation Age of Onset  . Colon cancer Father        deceased  . Prostate cancer Brother        deceased  . Other Child        neuroblastoma    SOCIAL HISTORY:   Social History  Substance Use Topics  . Smoking status:  Current Every Day Smoker    Packs/day: 0.50    Types: Cigarettes  . Smokeless tobacco: Never Used  . Alcohol use No    ALLERGIES:  has No Known Allergies.  MEDICATIONS:  Current Outpatient Prescriptions  Medication Sig Dispense Refill  . ADVAIR DISKUS 100-50 MCG/DOSE AEPB inhale 1 dose by mouth twice a day  0  . calcium-vitamin D (OSCAL WITH D) 500-200 MG-UNIT per tablet Take 1 tablet by mouth daily with breakfast.     . ferrous sulfate 325 (65 FE) MG tablet Take 325 mg by mouth daily with breakfast.    . Leuprolide Acetate (LUPRON IJ) Inject 1 mL as directed  every 3 (three) months.    . Multiple Vitamin (MULTIVITAMIN) tablet Take 1 tablet by mouth daily.    . ondansetron (ZOFRAN) 8 MG tablet 1 pill every 8 hours as needed for nausea/vomitting 40 tablet 1  . predniSONE (DELTASONE) 5 MG tablet Take 1 tablet (5 mg total) by mouth 2 (two) times daily with a meal. 60 tablet 3  . Vitamin D, Ergocalciferol, (DRISDOL) 50000 UNITS CAPS capsule Take 50,000 Units by mouth once a week. Fridays  0   No current facility-administered medications for this visit.     PHYSICAL EXAMINATION: ECOG PERFORMANCE STATUS: 0 - Asymptomatic  BP 109/63 (BP Location: Left Arm, Patient Position: Sitting)   Pulse (!) 103   Temp 97.8 F (36.6 C) (Tympanic)   Wt 171 lb 8 oz (77.8 kg)   BMI 23.26 kg/m   Filed Weights   11/01/16 0825  Weight: 171 lb 8 oz (77.8 kg)    GENERAL: Well-nourished well-developed; Alert, no distress and comfortable.   Accompanied by family. EYES: no pallor or icterus OROPHARYNX: no thrush or ulceration; good dentition  NECK: supple, no masses felt LYMPH:  no palpable lymphadenopathy in the cervical, axillary or inguinal regions LUNGS: clear to auscultation and  No wheeze or crackles HEART/CVS: regular rate & rhythm and positive for murmurs; No lower extremity edema ABDOMEN:abdomen soft, non-tender and normal bowel sounds Musculoskeletal:no cyanosis of digits and no clubbing  PSYCH: alert & oriented x 3 with fluent speech NEURO: no focal motor/sensory deficits SKIN:  no rashes or significant lesions  LABORATORY DATA:  I have reviewed the data as listed    Component Value Date/Time   NA 136 11/01/2016 0757   NA 138 08/25/2014 1442   K 4.2 11/01/2016 0757   K 4.1 08/25/2014 1442   CL 103 11/01/2016 0757   CL 106 08/25/2014 1442   CO2 26 11/01/2016 0757   CO2 25 08/25/2014 1442   GLUCOSE 113 (H) 11/01/2016 0757   GLUCOSE 138 (H) 08/25/2014 1442   BUN 14 11/01/2016 0757   BUN 19 08/25/2014 1442   CREATININE 0.61 11/01/2016 0757    CREATININE 0.53 (L) 08/25/2014 1442   CALCIUM 9.3 11/01/2016 0757   CALCIUM 9.0 08/25/2014 1442   PROT 7.0 11/01/2016 0757   PROT 6.8 08/25/2014 1442   ALBUMIN 4.2 11/01/2016 0757   ALBUMIN 3.9 08/25/2014 1442   AST 23 11/01/2016 0757   AST 18 08/25/2014 1442   ALT 13 (L) 11/01/2016 0757   ALT 13 (L) 08/25/2014 1442   ALKPHOS 181 (H) 11/01/2016 0757   ALKPHOS 73 08/25/2014 1442   BILITOT 0.7 11/01/2016 0757   BILITOT 0.7 08/25/2014 1442   GFRNONAA >60 11/01/2016 0757   GFRNONAA >60 08/25/2014 1442   GFRAA >60 11/01/2016 0757   GFRAA >60 08/25/2014 1442    No results  found for: SPEP, UPEP  Lab Results  Component Value Date   WBC 3.4 (L) 11/01/2016   NEUTROABS 1.9 11/01/2016   HGB 10.6 (L) 11/01/2016   HCT 30.9 (L) 11/01/2016   MCV 99.1 11/01/2016   PLT 136 (L) 11/01/2016      Chemistry      Component Value Date/Time   NA 136 11/01/2016 0757   NA 138 08/25/2014 1442   K 4.2 11/01/2016 0757   K 4.1 08/25/2014 1442   CL 103 11/01/2016 0757   CL 106 08/25/2014 1442   CO2 26 11/01/2016 0757   CO2 25 08/25/2014 1442   BUN 14 11/01/2016 0757   BUN 19 08/25/2014 1442   CREATININE 0.61 11/01/2016 0757   CREATININE 0.53 (L) 08/25/2014 1442      Component Value Date/Time   CALCIUM 9.3 11/01/2016 0757   CALCIUM 9.0 08/25/2014 1442   ALKPHOS 181 (H) 11/01/2016 0757   ALKPHOS 73 08/25/2014 1442   AST 23 11/01/2016 0757   AST 18 08/25/2014 1442   ALT 13 (L) 11/01/2016 0757   ALT 13 (L) 08/25/2014 1442   BILITOT 0.7 11/01/2016 0757   BILITOT 0.7 08/25/2014 1442       Results for Preston, Wilson (MRN [id number]) as of 09/07/2016 11:25  Ref. Range 01/11/2010 10:36 08/10/2010 10:16 04/27/2011 09:18 08/24/2011 09:27 01/11/2012 10:58 02/20/2012 10:27 04/24/2012 08:51 09/03/2012 10:26 10/10/2012 11:07 01/09/2013 11:28 02/20/2013 11:10 04/24/2013 10:54 07/03/2013 10:23 09/18/2013 08:38 12/18/2013 09:37 01/15/2014 09:56 03/05/2014 11:37 04/26/2014 10:55 07/29/2014 13:41 08/25/2014  14:42 09/22/2014 14:07 10/27/2014 14:08 11/24/2014 14:14 12/22/2014 14:49 01/19/2015 14:11 02/16/2015 14:08 04/11/2015 09:33 05/23/2015 11:56 06/21/2015 13:39 07/19/2015 13:55 08/16/2015 13:56 09/21/2015 14:30 10/19/2015 13:45 11/17/2015 13:35 12/12/2015 11:21 01/12/2016 13:00 01/20/2016 08:21 02/20/2016 08:57 03/12/2016 09:32 04/20/2016 10:25 05/25/2016 10:09 06/15/2016 09:08 07/04/2016 09:42 07/25/2016 10:00 08/15/2016 09:38 09/04/2016 10:30  PSA Latest Ref Range: 0.00 - 4.00 ng/mL ========== TEST N... ========== TEST N... 2.0 2.2 6.8 (H) 11.8 (H) 1.9 0.4 0.3 0.2 0.2 0.2 0.1 0.3 1.1 1.7 1.1 1.0 1.6 2.0 1.99 1.79 2.12 2.49 2.60 2.92 4.68 (H) 7.03 (H) 9.99 (H) 9.54 (H) 15.08 (H) 27.98 (H) 43.79 (H) 64.75 (H) 99.80 (H) 268.00 (H) 296.00 (H) 756.00 (H) 1,325.00 (H) 2,886.00 (H) 2,409.00 (H) 2,328.00 (H) 2,280.00 (H) 2,472.00 (H) 2,400.00 (H) 2,814.00 (H)     RADIOGRAPHIC STUDIES: I have personally reviewed the radiological images as listed and agreed with the findings in the report. No results found.   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 Carboplatin was added to Cabazitaxel with cycle 3. Currently status post 8 cycles- PSA slowly trended up to 2800/ from previous 2400. 09/05/2016 CT scan/bone scan shows stable/more sclerotic bone lesions. No evidence of any visceral metastases.    # currently on xofigo s/p cycle # 1 on June 13th; next on July 11th. [labs- on july5th]  # anemia sec to chemo- on aranesp 300 mcg q 2 w; also status post Venofer.  # thrombocytopenia- 130s- sec to xofigo. Monitor for now.   # Proceed with X-geva today [6/28]; Last Lupron q 57M- [last May 2nd];   # HOLD IV venofer today; X-geva today; cbc/aranesp on July 5th; weekly cbc/aranesp  # follow up with me in 4 weeks/ PSA; X-geva.   Orders Placed This Encounter  Procedures  . PSA    Standing Status:   Future    Number  of Occurrences:  1    Standing Expiration Date:   11/01/2017  . CBC with Differential    Standing Status:   Standing    Number of Occurrences:   4    Standing Expiration Date:   11/01/2017  . Comprehensive metabolic panel    Standing Status:   Future    Standing Expiration Date:   11/01/2017  . PSA    Standing Status:   Future    Standing Expiration Date:   11/01/2017     Cammie Sickle, MD 11/01/2016 4:33 PM

## 2016-11-01 NOTE — Progress Notes (Signed)
Xgeva only today per Dr. Rogue Bussing.

## 2016-11-01 NOTE — Assessment & Plan Note (Addendum)
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 Carboplatin was added to Cabazitaxel with cycle 3. Currently status post 8 cycles- PSA slowly trended up to 2800/ from previous 2400. 09/05/2016 CT scan/bone scan shows stable/more sclerotic bone lesions. No evidence of any visceral metastases.    # currently on xofigo s/p cycle # 1 on June 13th; next on July 11th. [labs- on july5th]  # anemia sec to chemo- on aranesp 300 mcg q 2 w; also status post Venofer.  # thrombocytopenia- 130s- sec to xofigo. Monitor for now.   # Proceed with X-geva today [6/28]; Last Lupron q 49M- [last May 2nd];   # HOLD IV venofer today; X-geva today; cbc/aranesp on July 5th; weekly cbc/aranesp  # follow up with me in 4 weeks/ PSA; X-geva.

## 2016-11-02 ENCOUNTER — Encounter: Payer: Self-pay | Admitting: *Deleted

## 2016-11-02 ENCOUNTER — Telehealth: Payer: Self-pay | Admitting: *Deleted

## 2016-11-02 NOTE — Telephone Encounter (Signed)
-----   Message from Cammie Sickle, MD sent at 11/01/2016  5:13 PM EDT ----- Please inform pt's wife re: PSA. Thx

## 2016-11-02 NOTE — Telephone Encounter (Signed)
Spoke with patient. Results of psa results provided to pt.

## 2016-11-08 ENCOUNTER — Inpatient Hospital Stay: Payer: Medicare Other | Attending: Internal Medicine

## 2016-11-08 ENCOUNTER — Encounter: Payer: Self-pay | Admitting: *Deleted

## 2016-11-08 ENCOUNTER — Inpatient Hospital Stay: Payer: Medicare Other

## 2016-11-08 DIAGNOSIS — D696 Thrombocytopenia, unspecified: Secondary | ICD-10-CM | POA: Diagnosis not present

## 2016-11-08 DIAGNOSIS — R011 Cardiac murmur, unspecified: Secondary | ICD-10-CM | POA: Diagnosis not present

## 2016-11-08 DIAGNOSIS — F17210 Nicotine dependence, cigarettes, uncomplicated: Secondary | ICD-10-CM | POA: Diagnosis not present

## 2016-11-08 DIAGNOSIS — Z923 Personal history of irradiation: Secondary | ICD-10-CM | POA: Insufficient documentation

## 2016-11-08 DIAGNOSIS — M869 Osteomyelitis, unspecified: Secondary | ICD-10-CM | POA: Diagnosis not present

## 2016-11-08 DIAGNOSIS — Z79899 Other long term (current) drug therapy: Secondary | ICD-10-CM | POA: Diagnosis not present

## 2016-11-08 DIAGNOSIS — J45909 Unspecified asthma, uncomplicated: Secondary | ICD-10-CM | POA: Insufficient documentation

## 2016-11-08 DIAGNOSIS — Z8042 Family history of malignant neoplasm of prostate: Secondary | ICD-10-CM | POA: Insufficient documentation

## 2016-11-08 DIAGNOSIS — E785 Hyperlipidemia, unspecified: Secondary | ICD-10-CM | POA: Diagnosis not present

## 2016-11-08 DIAGNOSIS — C7951 Secondary malignant neoplasm of bone: Secondary | ICD-10-CM | POA: Insufficient documentation

## 2016-11-08 DIAGNOSIS — R000 Tachycardia, unspecified: Secondary | ICD-10-CM | POA: Diagnosis not present

## 2016-11-08 DIAGNOSIS — D649 Anemia, unspecified: Secondary | ICD-10-CM | POA: Insufficient documentation

## 2016-11-08 DIAGNOSIS — C61 Malignant neoplasm of prostate: Secondary | ICD-10-CM | POA: Diagnosis present

## 2016-11-08 DIAGNOSIS — Z800 Family history of malignant neoplasm of digestive organs: Secondary | ICD-10-CM | POA: Diagnosis not present

## 2016-11-08 DIAGNOSIS — Z7952 Long term (current) use of systemic steroids: Secondary | ICD-10-CM | POA: Diagnosis not present

## 2016-11-08 LAB — CBC WITH DIFFERENTIAL/PLATELET
BASOS PCT: 0 %
Basophils Absolute: 0 10*3/uL (ref 0–0.1)
Eosinophils Absolute: 0 10*3/uL (ref 0–0.7)
Eosinophils Relative: 1 %
HEMATOCRIT: 30.6 % — AB (ref 40.0–52.0)
HEMOGLOBIN: 10.6 g/dL — AB (ref 13.0–18.0)
Lymphocytes Relative: 35 %
Lymphs Abs: 1.2 10*3/uL (ref 1.0–3.6)
MCH: 34 pg (ref 26.0–34.0)
MCHC: 34.7 g/dL (ref 32.0–36.0)
MCV: 98 fL (ref 80.0–100.0)
MONOS PCT: 9 %
Monocytes Absolute: 0.3 10*3/uL (ref 0.2–1.0)
NEUTROS ABS: 2 10*3/uL (ref 1.4–6.5)
NEUTROS PCT: 55 %
Platelets: 142 10*3/uL — ABNORMAL LOW (ref 150–440)
RBC: 3.12 MIL/uL — AB (ref 4.40–5.90)
RDW: 15.4 % — ABNORMAL HIGH (ref 11.5–14.5)
WBC: 3.5 10*3/uL — AB (ref 3.8–10.6)

## 2016-11-08 NOTE — Progress Notes (Signed)
Labs and weight completed today, patient aware of appointment for injection on Wednesday July 11th at 11am.    Weight today  78.2kg

## 2016-11-14 ENCOUNTER — Encounter
Admission: RE | Admit: 2016-11-14 | Discharge: 2016-11-14 | Disposition: A | Discharge status: Home / Self Care | Payer: Medicare Other | Source: Ambulatory Visit | Attending: Radiation Oncology | Admitting: Radiation Oncology

## 2016-11-14 ENCOUNTER — Ambulatory Visit: Payer: Medicare Other | Admitting: Radiation Oncology

## 2016-11-14 DIAGNOSIS — C61 Malignant neoplasm of prostate: Secondary | ICD-10-CM | POA: Diagnosis not present

## 2016-11-14 DIAGNOSIS — C7951 Secondary malignant neoplasm of bone: Secondary | ICD-10-CM | POA: Insufficient documentation

## 2016-11-14 MED ORDER — RADIUM RA 223 DICHLORIDE 30 MCCI/ML IV SOLN
123.9000 | Freq: Once | INTRAVENOUS | Status: AC
  Administered 2016-11-14: 123.9 via INTRAVENOUS

## 2016-11-15 ENCOUNTER — Inpatient Hospital Stay: Payer: Medicare Other

## 2016-11-15 DIAGNOSIS — C7951 Secondary malignant neoplasm of bone: Principal | ICD-10-CM

## 2016-11-15 DIAGNOSIS — C61 Malignant neoplasm of prostate: Secondary | ICD-10-CM | POA: Diagnosis not present

## 2016-11-15 LAB — CBC WITH DIFFERENTIAL/PLATELET
Basophils Absolute: 0 10*3/uL (ref 0–0.1)
Basophils Relative: 1 %
Eosinophils Absolute: 0 10*3/uL (ref 0–0.7)
Eosinophils Relative: 0 %
HEMATOCRIT: 29.7 % — AB (ref 40.0–52.0)
HEMOGLOBIN: 10.3 g/dL — AB (ref 13.0–18.0)
LYMPHS ABS: 1.3 10*3/uL (ref 1.0–3.6)
LYMPHS PCT: 36 %
MCH: 34.1 pg — AB (ref 26.0–34.0)
MCHC: 34.8 g/dL (ref 32.0–36.0)
MCV: 98.1 fL (ref 80.0–100.0)
MONO ABS: 0.3 10*3/uL (ref 0.2–1.0)
MONOS PCT: 10 %
NEUTROS ABS: 1.9 10*3/uL (ref 1.4–6.5)
NEUTROS PCT: 53 %
Platelets: 144 10*3/uL — ABNORMAL LOW (ref 150–440)
RBC: 3.03 MIL/uL — ABNORMAL LOW (ref 4.40–5.90)
RDW: 14.9 % — AB (ref 11.5–14.5)
WBC: 3.5 10*3/uL — ABNORMAL LOW (ref 3.8–10.6)

## 2016-11-15 NOTE — Progress Notes (Signed)
Radiation Oncology Procedure Note  Name: Preston Wilson   Date:   11/14/2016 MRN:  [id number] DOB: Jul 10, 1935    This 81 y.o. male presents to the clinic today for infusion of second Xofigo treatment.  REFERRING PROVIDER: Noreene Filbert, MD  HPI: Patient is a 81 year old male seen today for his second infusion of Xofigo for stage IV metastatic prostate cancer. He is doing well.Marland Kitchen Specifically denies any side effects or complaints from last effusion. He is seen today for infusion in nuclear medicine.  COMPLICATIONS OF TREATMENT: none  FOLLOW UP COMPLIANCE: keeps appointments   PHYSICAL EXAM:  There were no vitals taken for this visit. Well-developed well-nourished patient in NAD. HEENT reveals PERLA, EOMI, discs not visualized.  Oral cavity is clear. No oral mucosal lesions are identified. Neck is clear without evidence of cervical or supraclavicular adenopathy. Lungs are clear to A&P. Cardiac examination is essentially unremarkable with regular rate and rhythm without murmur rub or thrill. Abdomen is benign with no organomegaly or masses noted. Motor sensory and DTR levels are equal and symmetric in the upper and lower extremities. Cranial nerves II through XII are grossly intact. Proprioception is intact. No peripheral adenopathy or edema is identified. No motor or sensory levels are noted. Crude visual fields are within normal range.  RADIOLOGY RESULTS: No current films for review  PLAN: Peripheral line was started on the patient and 20 cc of normal saline were passed to make sure there was patency of the lines. Trudi Ida was administered over a 5 minute infusion push by nuclear medicine technologist supervised by radiation oncologist. After completion of IV push of Xofigo 30 cc an additional saline were passed through the peripheral line. Oral lines syringes drapes and original container of Xofigo were then taken to nuclear medicine for storage. Patient tolerated the procedure well  without side effect or complaint. Patient has anti-emetic medication. Have scheduled the patient for a three-week followup to check on his counts. Patient is to call sooner with any side effects or complaints.    Armstead Peaks., MD

## 2016-11-22 ENCOUNTER — Inpatient Hospital Stay: Payer: Medicare Other

## 2016-11-22 ENCOUNTER — Other Ambulatory Visit: Payer: Self-pay

## 2016-11-22 DIAGNOSIS — C7951 Secondary malignant neoplasm of bone: Principal | ICD-10-CM

## 2016-11-22 DIAGNOSIS — C61 Malignant neoplasm of prostate: Secondary | ICD-10-CM

## 2016-11-22 LAB — CBC WITH DIFFERENTIAL/PLATELET
BASOS PCT: 0 %
Basophils Absolute: 0 10*3/uL (ref 0–0.1)
EOS ABS: 0 10*3/uL (ref 0–0.7)
EOS PCT: 0 %
HCT: 29.7 % — ABNORMAL LOW (ref 40.0–52.0)
HEMOGLOBIN: 10.2 g/dL — AB (ref 13.0–18.0)
Lymphocytes Relative: 34 %
Lymphs Abs: 1.2 10*3/uL (ref 1.0–3.6)
MCH: 33.7 pg (ref 26.0–34.0)
MCHC: 34.5 g/dL (ref 32.0–36.0)
MCV: 97.8 fL (ref 80.0–100.0)
MONO ABS: 0.3 10*3/uL (ref 0.2–1.0)
MONOS PCT: 9 %
NEUTROS PCT: 57 %
Neutro Abs: 2.1 10*3/uL (ref 1.4–6.5)
PLATELETS: 152 10*3/uL (ref 150–440)
RBC: 3.03 MIL/uL — ABNORMAL LOW (ref 4.40–5.90)
RDW: 15 % — AB (ref 11.5–14.5)
WBC: 3.7 10*3/uL — ABNORMAL LOW (ref 3.8–10.6)

## 2016-11-28 ENCOUNTER — Ambulatory Visit: Payer: Self-pay | Admitting: Radiation Oncology

## 2016-11-29 ENCOUNTER — Inpatient Hospital Stay (HOSPITAL_BASED_OUTPATIENT_CLINIC_OR_DEPARTMENT_OTHER): Payer: Medicare Other | Admitting: Internal Medicine

## 2016-11-29 ENCOUNTER — Encounter: Payer: Self-pay | Admitting: Internal Medicine

## 2016-11-29 ENCOUNTER — Inpatient Hospital Stay: Payer: Medicare Other

## 2016-11-29 VITALS — BP 114/61 | HR 106 | Temp 98.5°F | Ht 72.0 in | Wt 173.4 lb

## 2016-11-29 DIAGNOSIS — E785 Hyperlipidemia, unspecified: Secondary | ICD-10-CM | POA: Diagnosis not present

## 2016-11-29 DIAGNOSIS — C7951 Secondary malignant neoplasm of bone: Secondary | ICD-10-CM

## 2016-11-29 DIAGNOSIS — C61 Malignant neoplasm of prostate: Secondary | ICD-10-CM

## 2016-11-29 DIAGNOSIS — Z7952 Long term (current) use of systemic steroids: Secondary | ICD-10-CM | POA: Diagnosis not present

## 2016-11-29 DIAGNOSIS — R000 Tachycardia, unspecified: Secondary | ICD-10-CM

## 2016-11-29 DIAGNOSIS — R011 Cardiac murmur, unspecified: Secondary | ICD-10-CM

## 2016-11-29 DIAGNOSIS — J45909 Unspecified asthma, uncomplicated: Secondary | ICD-10-CM | POA: Diagnosis not present

## 2016-11-29 DIAGNOSIS — D696 Thrombocytopenia, unspecified: Secondary | ICD-10-CM

## 2016-11-29 DIAGNOSIS — Z79899 Other long term (current) drug therapy: Secondary | ICD-10-CM

## 2016-11-29 DIAGNOSIS — F17210 Nicotine dependence, cigarettes, uncomplicated: Secondary | ICD-10-CM

## 2016-11-29 DIAGNOSIS — D649 Anemia, unspecified: Secondary | ICD-10-CM | POA: Diagnosis not present

## 2016-11-29 DIAGNOSIS — M869 Osteomyelitis, unspecified: Secondary | ICD-10-CM

## 2016-11-29 DIAGNOSIS — Z923 Personal history of irradiation: Secondary | ICD-10-CM

## 2016-11-29 DIAGNOSIS — Z800 Family history of malignant neoplasm of digestive organs: Secondary | ICD-10-CM

## 2016-11-29 LAB — CBC WITH DIFFERENTIAL/PLATELET
Basophils Absolute: 0 10*3/uL (ref 0–0.1)
Basophils Relative: 0 %
EOS PCT: 1 %
Eosinophils Absolute: 0 10*3/uL (ref 0–0.7)
HEMATOCRIT: 28.6 % — AB (ref 40.0–52.0)
Hemoglobin: 10 g/dL — ABNORMAL LOW (ref 13.0–18.0)
LYMPHS ABS: 1 10*3/uL (ref 1.0–3.6)
LYMPHS PCT: 32 %
MCH: 34.3 pg — AB (ref 26.0–34.0)
MCHC: 35.1 g/dL (ref 32.0–36.0)
MCV: 97.8 fL (ref 80.0–100.0)
MONO ABS: 0.2 10*3/uL (ref 0.2–1.0)
Monocytes Relative: 8 %
NEUTROS ABS: 1.8 10*3/uL (ref 1.4–6.5)
Neutrophils Relative %: 59 %
PLATELETS: 131 10*3/uL — AB (ref 150–440)
RBC: 2.93 MIL/uL — ABNORMAL LOW (ref 4.40–5.90)
RDW: 14.9 % — AB (ref 11.5–14.5)
WBC: 3.1 10*3/uL — ABNORMAL LOW (ref 3.8–10.6)

## 2016-11-29 LAB — COMPREHENSIVE METABOLIC PANEL
ALT: 14 U/L — ABNORMAL LOW (ref 17–63)
AST: 23 U/L (ref 15–41)
Albumin: 3.9 g/dL (ref 3.5–5.0)
Alkaline Phosphatase: 85 U/L (ref 38–126)
Anion gap: 7 (ref 5–15)
BILIRUBIN TOTAL: 0.6 mg/dL (ref 0.3–1.2)
BUN: 13 mg/dL (ref 6–20)
CHLORIDE: 102 mmol/L (ref 101–111)
CO2: 26 mmol/L (ref 22–32)
Calcium: 9.1 mg/dL (ref 8.9–10.3)
Creatinine, Ser: 0.58 mg/dL — ABNORMAL LOW (ref 0.61–1.24)
Glucose, Bld: 114 mg/dL — ABNORMAL HIGH (ref 65–99)
POTASSIUM: 4 mmol/L (ref 3.5–5.1)
Sodium: 135 mmol/L (ref 135–145)
TOTAL PROTEIN: 6.8 g/dL (ref 6.5–8.1)

## 2016-11-29 LAB — PSA: PROSTATIC SPECIFIC ANTIGEN: 1911 ng/mL — AB (ref 0.00–4.00)

## 2016-11-29 MED ORDER — DENOSUMAB 120 MG/1.7ML ~~LOC~~ SOLN
120.0000 mg | Freq: Once | SUBCUTANEOUS | Status: AC
  Administered 2016-11-29: 120 mg via SUBCUTANEOUS
  Filled 2016-11-29: qty 1.7

## 2016-11-29 NOTE — Progress Notes (Signed)
Progress here for follow up and lab results. No changes since last appointment.

## 2016-11-29 NOTE — Assessment & Plan Note (Addendum)
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 09/05/2016 CT scan/bone scan shows stable/more sclerotic bone lesions. No evidence of any visceral metastases.  PSA- trending down [~1900]  # currently on xofigo s/p cycle # 2 on July 11th; aug 2nd- blood check again next week; next on Aug 8th  # anemia sec to chemo- on aranesp 300 mcg q 2 w; also status post Venofer. Today hemoglobin is 10. Hold off Aranesp. Recommend IV iron.  # thrombocytopenia- 130s- sec to xofigo. Monitor for now.  # Tachycardia- 106; beta-blocker is okay form oncology standpoint.    # Proceed with X-geva today [6/28]; Last Lupron q 85M- [last May 2nd];   # proceed with IV venofer today; X-geva today; cbc/aranesp on July 30th; weekly cbc/aranesp; follow up with me in 4 weeks/ PSA; X-geva.   Cc; Dr.Kowalski.

## 2016-11-29 NOTE — Progress Notes (Signed)
[place] OFFICE PROGRESS NOTE  Patient Care Team: Morayati, Lourdes Sledge, MD as PCP - General (Endocrinology)  Cancer Staging No matching staging information was found for the patient.   Oncology History   1. Carcinoma of prostate diagnosis in 2006.  Had received radiation therapy, external beam to prostate and pelvic lymph nodes.  Had Gleason 9 (4+5.)  PainBaseline PSA was 13. 2. Received Lupron injection after her radiation therapy also.  PSA was going up.  Last year or so received Lupron injection in September but did not have any followup.   3. Recently (this August, 2012) PSA was found to be 17. Patient was started on Lupron from September of 2012. 4. Patient has progressive disease by PSA criteria (October, 2013) 5. Patient was started on Zytiga and prednisone in November of 2013. 6.traumatic hip fracture ((right femur) status post internally fixed ,no evidence of metastatic disease(May of 2014) 7.bone scan in July of 2014 at outside institutions shows metastases to left scapula and left 11th rib/.. 8.progressive On ZYTIGA by PSA criteria.  9[name], September of 2015- Progression- Jan 2016  # AUG 11th 2017- Provenge 2 of planned 3 [sec to logistical issues]  # SEP 2017- START DOCETAXEL weekly; CT C/A/P-Progressive bone mets [compared to 2016 CT]PSA PROGRESSION; NOV 2017BONE SCAN STABLE  # NOV 21st 2017-  CABAZITAXEL q 3 W; 05/02/2016-Added Carboplatin with cycle #3 [given rising PSA]   # June 13th XOFIGO #1  # Anemia on aranesp/IV iron [end of may 2018]  # FEB 2018- GUARDIANT TESTING- AR-7/ no BRCA or targettable mutations.      Prostate cancer metastatic to bone [facility])     INTERVAL HISTORY:  Preston Wilson 81 y.o.  male pleasant patient above history of Metastatic prostate cancer status post multiple lines of therapy Currently on xofigo is here for follow-up. Patient received cycle #2 approximately on  July 11th.  He tolerated treatment  well. No nausea no vomiting.  Patient complains of  Mild fatigue. He continues to deny any pain. Denies any loss of appetite. No diarrhea. No tingling or numbness. No fevers or chills. No cough. No swelling of the legs. No weight loss.  REVIEW OF SYSTEMS:  A complete 10 point review of system is done which is negative except mentioned above/history of present illness.   PAST MEDICAL HISTORY :  Past Medical History:  Diagnosis Date  . Asthma   . Heart murmur   . Hyperlipidemia   . Hypocalcemia   . Osteomyelitis ([place])   . Prostate cancer ([place])   . SOB (shortness of breath)     PAST SURGICAL HISTORY :   Past Surgical History:  Procedure Laterality Date  . COLONOSCOPY WITH PROPOFOL N/A 04/12/2015   Procedure: COLONOSCOPY WITH PROPOFOL;  Surgeon: Lollie Sails, MD;  Location: [facility] ENDOSCOPY;  Service: Endoscopy;  Laterality: N/A;  . EXCISIONAL HEMORRHOIDECTOMY    . TONSILLECTOMY      FAMILY HISTORY :   Family History  Problem Relation Age of Onset  . Colon cancer Father        deceased  . Prostate cancer Brother        deceased  . Other Child        neuroblastoma    SOCIAL HISTORY:   Social History  Substance Use Topics  . Smoking status: Current Every Day Smoker    Packs/day: 0.50    Types: Cigarettes  . Smokeless tobacco: Never Used  . Alcohol use No  ALLERGIES:  has No Known Allergies.  MEDICATIONS:  Current Outpatient Prescriptions  Medication Sig Dispense Refill  . ADVAIR DISKUS 100-50 MCG/DOSE AEPB inhale 1 dose by mouth twice a day  0  . calcium-vitamin D (OSCAL WITH D) 500-200 MG-UNIT per tablet Take 1 tablet by mouth daily with breakfast.     . ferrous sulfate 325 (65 FE) MG tablet Take 325 mg by mouth daily with breakfast.    . Leuprolide Acetate (LUPRON IJ) Inject 1 mL as directed every 3 (three) months.    . Multiple Vitamin (MULTIVITAMIN) tablet Take 1 tablet by mouth daily.    . ondansetron (ZOFRAN) 8 MG tablet 1 pill every 8 hours as needed for  nausea/vomitting 40 tablet 1  . predniSONE (DELTASONE) 5 MG tablet Take 1 tablet (5 mg total) by mouth 2 (two) times daily with a meal. 60 tablet 3  . Vitamin D, Ergocalciferol, (DRISDOL) 50000 UNITS CAPS capsule Take 50,000 Units by mouth once a week. Fridays  0   No current facility-administered medications for this visit.     PHYSICAL EXAMINATION: ECOG PERFORMANCE STATUS: 0 - Asymptomatic  BP 114/61 (BP Location: Right Arm, Patient Position: Sitting)   Pulse (!) 106   Temp 98.5 F (36.9 C) (Tympanic)   Ht 6' (1.829 m)   Wt 173 lb 6.4 oz (78.7 kg)   BMI 23.52 kg/m   Filed Weights   11/29/16 0840  Weight: 173 lb 6.4 oz (78.7 kg)    GENERAL: Well-nourished well-developed; Alert, no distress and comfortable.   Accompanied by family. EYES: no pallor or icterus OROPHARYNX: no thrush or ulceration; good dentition  NECK: supple, no masses felt LYMPH:  no palpable lymphadenopathy in the cervical, axillary or inguinal regions LUNGS: clear to auscultation and  No wheeze or crackles HEART/CVS: regular rate & rhythm and positive for murmurs; No lower extremity edema ABDOMEN:abdomen soft, non-tender and normal bowel sounds Musculoskeletal:no cyanosis of digits and no clubbing  PSYCH: alert & oriented x 3 with fluent speech NEURO: no focal motor/sensory deficits SKIN:  no rashes or significant lesions  LABORATORY DATA:  I have reviewed the data as listed    Component Value Date/Time   NA 135 11/29/2016 0812   NA 138 08/25/2014 1442   K 4.0 11/29/2016 0812   K 4.1 08/25/2014 1442   CL 102 11/29/2016 0812   CL 106 08/25/2014 1442   CO2 26 11/29/2016 0812   CO2 25 08/25/2014 1442   GLUCOSE 114 (H) 11/29/2016 0812   GLUCOSE 138 (H) 08/25/2014 1442   BUN 13 11/29/2016 0812   BUN 19 08/25/2014 1442   CREATININE 0.58 (L) 11/29/2016 0812   CREATININE 0.53 (L) 08/25/2014 1442   CALCIUM 9.1 11/29/2016 0812   CALCIUM 9.0 08/25/2014 1442   PROT 6.8 11/29/2016 0812   PROT 6.8  08/25/2014 1442   ALBUMIN 3.9 11/29/2016 0812   ALBUMIN 3.9 08/25/2014 1442   AST 23 11/29/2016 0812   AST 18 08/25/2014 1442   ALT 14 (L) 11/29/2016 0812   ALT 13 (L) 08/25/2014 1442   ALKPHOS 85 11/29/2016 0812   ALKPHOS 73 08/25/2014 1442   BILITOT 0.6 11/29/2016 0812   BILITOT 0.7 08/25/2014 1442   GFRNONAA >60 11/29/2016 0812   GFRNONAA >60 08/25/2014 1442   GFRAA >60 11/29/2016 0812   GFRAA >60 08/25/2014 1442    No results found for: SPEP, UPEP  Lab Results  Component Value Date   WBC 2.9 (L) 12/03/2016   NEUTROABS 1.5 12/03/2016  HGB 9.6 (L) 12/03/2016   HCT 27.4 (L) 12/03/2016   MCV 97.3 12/03/2016   PLT 134 (L) 12/03/2016      Chemistry      Component Value Date/Time   NA 135 11/29/2016 0812   NA 138 08/25/2014 1442   K 4.0 11/29/2016 0812   K 4.1 08/25/2014 1442   CL 102 11/29/2016 0812   CL 106 08/25/2014 1442   CO2 26 11/29/2016 0812   CO2 25 08/25/2014 1442   BUN 13 11/29/2016 0812   BUN 19 08/25/2014 1442   CREATININE 0.58 (L) 11/29/2016 0812   CREATININE 0.53 (L) 08/25/2014 1442      Component Value Date/Time   CALCIUM 9.1 11/29/2016 0812   CALCIUM 9.0 08/25/2014 1442   ALKPHOS 85 11/29/2016 0812   ALKPHOS 73 08/25/2014 1442   AST 23 11/29/2016 0812   AST 18 08/25/2014 1442   ALT 14 (L) 11/29/2016 0812   ALT 13 (L) 08/25/2014 1442   BILITOT 0.6 11/29/2016 0812   BILITOT 0.7 08/25/2014 1442       Results for SALMAN, WELLEN (MRN [id number]) as of 12/03/2016 13:25  Ref. Range 08/15/2016 09:38 09/04/2016 10:30 10/04/2016 13:10 11/01/2016 07:57 11/29/2016 08:12  PSA Latest Ref Range: 0.00 - 4.00 ng/mL 2,400.00 (H) 2,814.00 (H) 2,151.00 (H)    Prostatic Specific Antigen Latest Ref Range: 0.00 - 4.00 ng/mL    1,998.00 (H) 1,911.00 (H)       RADIOGRAPHIC STUDIES: I have personally reviewed the radiological images as listed and agreed with the findings in the report. No results found.   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 09/05/2016 CT scan/bone scan shows stable/more sclerotic bone lesions. No evidence of any visceral metastases.  PSA- trending down [~1900]  # currently on xofigo s/p cycle # 2 on July 11th; aug 2nd- blood check again next week; next on Aug 8th  # anemia sec to chemo- on aranesp 300 mcg q 2 w; also status post Venofer. Today hemoglobin is 10. Hold off Aranesp. Recommend IV iron.  # thrombocytopenia- 130s- sec to xofigo. Monitor for now.  # Tachycardia- 106; beta-blocker is okay form oncology standpoint.    # Proceed with X-geva today [6/28]; Last Lupron q 26M- [last May 2nd];   # proceed with IV venofer today; X-geva today; cbc/aranesp on July 30th; weekly cbc/aranesp; follow up with me in 4 weeks/ PSA; X-geva.   Cc; Dr.Kowalski.   Orders Placed This Encounter  Procedures  . CBC with Differential/Platelet    Standing Status:   Standing    Number of Occurrences:   4    Standing Expiration Date:   11/29/2017  . Comprehensive metabolic panel    Standing Status:   Future    Standing Expiration Date:   11/29/2017  . PSA    Standing Status:   Future    Standing Expiration Date:   11/29/2017  . Hold Tube- Blood Bank    Standing Status:   Future    Standing Expiration Date:   11/29/2017     Cammie Sickle, MD 12/03/2016 1:26 PM

## 2016-11-30 ENCOUNTER — Encounter: Payer: Self-pay | Admitting: Hematology and Oncology

## 2016-11-30 NOTE — Progress Notes (Signed)
Please inform patient that PSA- is improving; no new recommendations at this time

## 2016-12-03 ENCOUNTER — Inpatient Hospital Stay: Payer: Medicare Other

## 2016-12-03 VITALS — BP 109/64 | HR 101

## 2016-12-03 DIAGNOSIS — C61 Malignant neoplasm of prostate: Secondary | ICD-10-CM

## 2016-12-03 DIAGNOSIS — C7951 Secondary malignant neoplasm of bone: Principal | ICD-10-CM

## 2016-12-03 LAB — CBC WITH DIFFERENTIAL/PLATELET
BASOS ABS: 0 10*3/uL (ref 0–0.1)
Basophils Relative: 0 %
EOS PCT: 1 %
Eosinophils Absolute: 0 10*3/uL (ref 0–0.7)
HCT: 27.4 % — ABNORMAL LOW (ref 40.0–52.0)
Hemoglobin: 9.6 g/dL — ABNORMAL LOW (ref 13.0–18.0)
LYMPHS ABS: 1.2 10*3/uL (ref 1.0–3.6)
LYMPHS PCT: 40 %
MCH: 34 pg (ref 26.0–34.0)
MCHC: 35 g/dL (ref 32.0–36.0)
MCV: 97.3 fL (ref 80.0–100.0)
MONO ABS: 0.2 10*3/uL (ref 0.2–1.0)
Monocytes Relative: 7 %
NEUTROS ABS: 1.5 10*3/uL (ref 1.4–6.5)
Neutrophils Relative %: 52 %
PLATELETS: 134 10*3/uL — AB (ref 150–440)
RBC: 2.81 MIL/uL — ABNORMAL LOW (ref 4.40–5.90)
RDW: 14.7 % — ABNORMAL HIGH (ref 11.5–14.5)
WBC: 2.9 10*3/uL — ABNORMAL LOW (ref 3.8–10.6)

## 2016-12-03 MED ORDER — DARBEPOETIN ALFA 300 MCG/0.6ML IJ SOSY
300.0000 ug | PREFILLED_SYRINGE | Freq: Once | INTRAMUSCULAR | Status: AC
  Administered 2016-12-03: 300 ug via SUBCUTANEOUS
  Filled 2016-12-03: qty 0.6

## 2016-12-06 ENCOUNTER — Other Ambulatory Visit: Payer: Self-pay | Admitting: *Deleted

## 2016-12-06 ENCOUNTER — Encounter: Payer: Self-pay | Admitting: *Deleted

## 2016-12-06 ENCOUNTER — Inpatient Hospital Stay: Payer: Medicare Other | Attending: Internal Medicine

## 2016-12-06 ENCOUNTER — Inpatient Hospital Stay: Payer: Medicare Other

## 2016-12-06 VITALS — BP 122/62 | HR 97 | Temp 98.8°F | Resp 18

## 2016-12-06 DIAGNOSIS — Z7952 Long term (current) use of systemic steroids: Secondary | ICD-10-CM | POA: Diagnosis not present

## 2016-12-06 DIAGNOSIS — E785 Hyperlipidemia, unspecified: Secondary | ICD-10-CM | POA: Insufficient documentation

## 2016-12-06 DIAGNOSIS — D6481 Anemia due to antineoplastic chemotherapy: Secondary | ICD-10-CM | POA: Insufficient documentation

## 2016-12-06 DIAGNOSIS — R011 Cardiac murmur, unspecified: Secondary | ICD-10-CM | POA: Insufficient documentation

## 2016-12-06 DIAGNOSIS — M869 Osteomyelitis, unspecified: Secondary | ICD-10-CM | POA: Insufficient documentation

## 2016-12-06 DIAGNOSIS — C7951 Secondary malignant neoplasm of bone: Principal | ICD-10-CM

## 2016-12-06 DIAGNOSIS — F17210 Nicotine dependence, cigarettes, uncomplicated: Secondary | ICD-10-CM | POA: Diagnosis not present

## 2016-12-06 DIAGNOSIS — Z800 Family history of malignant neoplasm of digestive organs: Secondary | ICD-10-CM | POA: Insufficient documentation

## 2016-12-06 DIAGNOSIS — Z9221 Personal history of antineoplastic chemotherapy: Secondary | ICD-10-CM | POA: Diagnosis not present

## 2016-12-06 DIAGNOSIS — R000 Tachycardia, unspecified: Secondary | ICD-10-CM | POA: Diagnosis not present

## 2016-12-06 DIAGNOSIS — D696 Thrombocytopenia, unspecified: Secondary | ICD-10-CM | POA: Diagnosis not present

## 2016-12-06 DIAGNOSIS — Z8042 Family history of malignant neoplasm of prostate: Secondary | ICD-10-CM | POA: Diagnosis not present

## 2016-12-06 DIAGNOSIS — C61 Malignant neoplasm of prostate: Secondary | ICD-10-CM

## 2016-12-06 DIAGNOSIS — J45909 Unspecified asthma, uncomplicated: Secondary | ICD-10-CM | POA: Insufficient documentation

## 2016-12-06 DIAGNOSIS — R0602 Shortness of breath: Secondary | ICD-10-CM | POA: Insufficient documentation

## 2016-12-06 DIAGNOSIS — Z79899 Other long term (current) drug therapy: Secondary | ICD-10-CM | POA: Insufficient documentation

## 2016-12-06 LAB — CBC WITH DIFFERENTIAL/PLATELET
Basophils Absolute: 0 10*3/uL (ref 0–0.1)
Basophils Relative: 0 %
Eosinophils Absolute: 0 10*3/uL (ref 0–0.7)
Eosinophils Relative: 1 %
HEMATOCRIT: 28.6 % — AB (ref 40.0–52.0)
HEMOGLOBIN: 9.9 g/dL — AB (ref 13.0–18.0)
LYMPHS ABS: 1.2 10*3/uL (ref 1.0–3.6)
LYMPHS PCT: 40 %
MCH: 33.7 pg (ref 26.0–34.0)
MCHC: 34.6 g/dL (ref 32.0–36.0)
MCV: 97.5 fL (ref 80.0–100.0)
MONO ABS: 0.3 10*3/uL (ref 0.2–1.0)
MONOS PCT: 9 %
NEUTROS ABS: 1.6 10*3/uL (ref 1.4–6.5)
Neutrophils Relative %: 50 %
Platelets: 143 10*3/uL — ABNORMAL LOW (ref 150–440)
RBC: 2.93 MIL/uL — ABNORMAL LOW (ref 4.40–5.90)
RDW: 15.3 % — AB (ref 11.5–14.5)
WBC: 3.1 10*3/uL — ABNORMAL LOW (ref 3.8–10.6)

## 2016-12-06 MED ORDER — SODIUM CHLORIDE 0.9 % IV SOLN
Freq: Once | INTRAVENOUS | Status: AC
  Administered 2016-12-06: 14:00:00 via INTRAVENOUS
  Filled 2016-12-06: qty 1000

## 2016-12-06 MED ORDER — IRON SUCROSE 20 MG/ML IV SOLN
200.0000 mg | Freq: Once | INTRAVENOUS | Status: AC
  Administered 2016-12-06: 200 mg via INTRAVENOUS
  Filled 2016-12-06: qty 10

## 2016-12-06 NOTE — Progress Notes (Unsigned)
WEIGHT CHECK

## 2016-12-06 NOTE — Progress Notes (Signed)
Patient here today for Labs and a weight one week prior to receiving Xofigo.  Weight today is 78.9.

## 2016-12-10 ENCOUNTER — Inpatient Hospital Stay: Payer: Medicare Other

## 2016-12-10 ENCOUNTER — Other Ambulatory Visit: Payer: Self-pay

## 2016-12-10 DIAGNOSIS — C7951 Secondary malignant neoplasm of bone: Principal | ICD-10-CM

## 2016-12-10 DIAGNOSIS — C61 Malignant neoplasm of prostate: Secondary | ICD-10-CM | POA: Diagnosis not present

## 2016-12-10 LAB — CBC WITH DIFFERENTIAL/PLATELET
BASOS PCT: 2 %
Basophils Absolute: 0.1 10*3/uL (ref 0–0.1)
EOS ABS: 0 10*3/uL (ref 0–0.7)
EOS PCT: 1 %
HCT: 29 % — ABNORMAL LOW (ref 40.0–52.0)
Hemoglobin: 10.1 g/dL — ABNORMAL LOW (ref 13.0–18.0)
LYMPHS ABS: 1.2 10*3/uL (ref 1.0–3.6)
Lymphocytes Relative: 38 %
MCH: 34 pg (ref 26.0–34.0)
MCHC: 34.7 g/dL (ref 32.0–36.0)
MCV: 98 fL (ref 80.0–100.0)
MONOS PCT: 4 %
Monocytes Absolute: 0.1 10*3/uL — ABNORMAL LOW (ref 0.2–1.0)
NEUTROS PCT: 55 %
Neutro Abs: 1.7 10*3/uL (ref 1.4–6.5)
PLATELETS: 144 10*3/uL — AB (ref 150–440)
RBC: 2.96 MIL/uL — ABNORMAL LOW (ref 4.40–5.90)
RDW: 15.4 % — AB (ref 11.5–14.5)
WBC: 3.1 10*3/uL — ABNORMAL LOW (ref 3.8–10.6)

## 2016-12-12 ENCOUNTER — Ambulatory Visit: Payer: Medicare Other | Admitting: Radiation Oncology

## 2016-12-12 ENCOUNTER — Ambulatory Visit
Admission: RE | Admit: 2016-12-12 | Discharge: 2016-12-12 | Disposition: A | Discharge status: Home / Self Care | Payer: Medicare Other | Source: Ambulatory Visit | Attending: Radiation Oncology | Admitting: Radiation Oncology

## 2016-12-12 DIAGNOSIS — C61 Malignant neoplasm of prostate: Secondary | ICD-10-CM | POA: Diagnosis present

## 2016-12-12 DIAGNOSIS — C7951 Secondary malignant neoplasm of bone: Secondary | ICD-10-CM | POA: Diagnosis not present

## 2016-12-12 MED ORDER — RADIUM RA 223 DICHLORIDE 30 MCCI/ML IV SOLN
126.6800 | Freq: Once | INTRAVENOUS | Status: AC
  Administered 2016-12-12: 126.68 via INTRAVENOUS

## 2016-12-17 ENCOUNTER — Inpatient Hospital Stay: Payer: Medicare Other

## 2016-12-17 VITALS — BP 100/54

## 2016-12-17 DIAGNOSIS — C61 Malignant neoplasm of prostate: Secondary | ICD-10-CM

## 2016-12-17 DIAGNOSIS — C7951 Secondary malignant neoplasm of bone: Principal | ICD-10-CM

## 2016-12-17 LAB — CBC WITH DIFFERENTIAL/PLATELET
BASOS PCT: 0 %
Basophils Absolute: 0 10*3/uL (ref 0–0.1)
EOS ABS: 0 10*3/uL (ref 0–0.7)
Eosinophils Relative: 0 %
HCT: 28.6 % — ABNORMAL LOW (ref 40.0–52.0)
Hemoglobin: 9.9 g/dL — ABNORMAL LOW (ref 13.0–18.0)
Lymphocytes Relative: 32 %
Lymphs Abs: 1 10*3/uL (ref 1.0–3.6)
MCH: 33.7 pg (ref 26.0–34.0)
MCHC: 34.5 g/dL (ref 32.0–36.0)
MCV: 97.7 fL (ref 80.0–100.0)
MONO ABS: 0.2 10*3/uL (ref 0.2–1.0)
MONOS PCT: 6 %
NEUTROS PCT: 62 %
Neutro Abs: 2 10*3/uL (ref 1.4–6.5)
Platelets: 146 10*3/uL — ABNORMAL LOW (ref 150–440)
RBC: 2.93 MIL/uL — ABNORMAL LOW (ref 4.40–5.90)
RDW: 16 % — AB (ref 11.5–14.5)
WBC: 3.3 10*3/uL — ABNORMAL LOW (ref 3.8–10.6)

## 2016-12-17 MED ORDER — DARBEPOETIN ALFA 300 MCG/0.6ML IJ SOSY
300.0000 ug | PREFILLED_SYRINGE | Freq: Once | INTRAMUSCULAR | Status: AC
  Administered 2016-12-17: 300 ug via SUBCUTANEOUS
  Filled 2016-12-17: qty 0.6

## 2016-12-20 ENCOUNTER — Other Ambulatory Visit: Payer: Self-pay

## 2016-12-26 ENCOUNTER — Ambulatory Visit: Payer: Self-pay | Admitting: Radiation Oncology

## 2016-12-27 ENCOUNTER — Inpatient Hospital Stay (HOSPITAL_BASED_OUTPATIENT_CLINIC_OR_DEPARTMENT_OTHER): Payer: Medicare Other | Admitting: Internal Medicine

## 2016-12-27 ENCOUNTER — Inpatient Hospital Stay: Payer: Medicare Other

## 2016-12-27 VITALS — BP 113/69 | HR 99 | Temp 97.8°F | Resp 20

## 2016-12-27 DIAGNOSIS — C61 Malignant neoplasm of prostate: Secondary | ICD-10-CM | POA: Diagnosis not present

## 2016-12-27 DIAGNOSIS — R011 Cardiac murmur, unspecified: Secondary | ICD-10-CM

## 2016-12-27 DIAGNOSIS — R000 Tachycardia, unspecified: Secondary | ICD-10-CM

## 2016-12-27 DIAGNOSIS — J45909 Unspecified asthma, uncomplicated: Secondary | ICD-10-CM | POA: Diagnosis not present

## 2016-12-27 DIAGNOSIS — D6481 Anemia due to antineoplastic chemotherapy: Secondary | ICD-10-CM

## 2016-12-27 DIAGNOSIS — D696 Thrombocytopenia, unspecified: Secondary | ICD-10-CM | POA: Diagnosis not present

## 2016-12-27 DIAGNOSIS — R0602 Shortness of breath: Secondary | ICD-10-CM | POA: Diagnosis not present

## 2016-12-27 DIAGNOSIS — M869 Osteomyelitis, unspecified: Secondary | ICD-10-CM

## 2016-12-27 DIAGNOSIS — C7951 Secondary malignant neoplasm of bone: Principal | ICD-10-CM

## 2016-12-27 DIAGNOSIS — E785 Hyperlipidemia, unspecified: Secondary | ICD-10-CM | POA: Diagnosis not present

## 2016-12-27 DIAGNOSIS — Z79899 Other long term (current) drug therapy: Secondary | ICD-10-CM | POA: Diagnosis not present

## 2016-12-27 DIAGNOSIS — Z9221 Personal history of antineoplastic chemotherapy: Secondary | ICD-10-CM

## 2016-12-27 DIAGNOSIS — F17210 Nicotine dependence, cigarettes, uncomplicated: Secondary | ICD-10-CM

## 2016-12-27 DIAGNOSIS — Z7952 Long term (current) use of systemic steroids: Secondary | ICD-10-CM

## 2016-12-27 DIAGNOSIS — Z8042 Family history of malignant neoplasm of prostate: Secondary | ICD-10-CM

## 2016-12-27 DIAGNOSIS — Z800 Family history of malignant neoplasm of digestive organs: Secondary | ICD-10-CM

## 2016-12-27 LAB — COMPREHENSIVE METABOLIC PANEL
ALBUMIN: 4.2 g/dL (ref 3.5–5.0)
ALT: 15 U/L — ABNORMAL LOW (ref 17–63)
ANION GAP: 9 (ref 5–15)
AST: 23 U/L (ref 15–41)
Alkaline Phosphatase: 67 U/L (ref 38–126)
BILIRUBIN TOTAL: 0.9 mg/dL (ref 0.3–1.2)
BUN: 17 mg/dL (ref 6–20)
CHLORIDE: 102 mmol/L (ref 101–111)
CO2: 27 mmol/L (ref 22–32)
Calcium: 9.5 mg/dL (ref 8.9–10.3)
Creatinine, Ser: 0.6 mg/dL — ABNORMAL LOW (ref 0.61–1.24)
GFR calc Af Amer: >60 mL/min (ref >60)
GFR calc non Af Amer: >60 mL/min (ref >60)
GLUCOSE: 142 mg/dL — AB (ref 65–99)
POTASSIUM: 4.4 mmol/L (ref 3.5–5.1)
SODIUM: 138 mmol/L (ref 135–145)
Total Protein: 7.1 g/dL (ref 6.5–8.1)

## 2016-12-27 LAB — CBC WITH DIFFERENTIAL/PLATELET
BASOS ABS: 0 10*3/uL (ref 0–0.1)
BASOS PCT: 0 %
EOS ABS: 0 10*3/uL (ref 0–0.7)
Eosinophils Relative: 0 %
HCT: 29.7 % — ABNORMAL LOW (ref 40.0–52.0)
Hemoglobin: 10.3 g/dL — ABNORMAL LOW (ref 13.0–18.0)
Lymphocytes Relative: 27 %
Lymphs Abs: 0.7 10*3/uL — ABNORMAL LOW (ref 1.0–3.6)
MCH: 34 pg (ref 26.0–34.0)
MCHC: 34.6 g/dL (ref 32.0–36.0)
MCV: 98.5 fL (ref 80.0–100.0)
MONO ABS: 0.2 10*3/uL (ref 0.2–1.0)
Monocytes Relative: 8 %
NEUTROS ABS: 1.7 10*3/uL (ref 1.4–6.5)
Neutrophils Relative %: 65 %
PLATELETS: 131 10*3/uL — AB (ref 150–440)
RBC: 3.02 MIL/uL — ABNORMAL LOW (ref 4.40–5.90)
RDW: 16.2 % — AB (ref 11.5–14.5)
WBC: 2.7 10*3/uL — ABNORMAL LOW (ref 3.8–10.6)

## 2016-12-27 LAB — SAMPLE TO BLOOD BANK

## 2016-12-27 LAB — PSA: Prostatic Specific Antigen: 2109 ng/mL — ABNORMAL HIGH (ref 0.00–4.00)

## 2016-12-27 MED ORDER — DENOSUMAB 120 MG/1.7ML ~~LOC~~ SOLN
120.0000 mg | Freq: Once | SUBCUTANEOUS | Status: AC
  Administered 2016-12-27: 120 mg via SUBCUTANEOUS
  Filled 2016-12-27: qty 1.7

## 2016-12-27 NOTE — Progress Notes (Signed)
[place] OFFICE PROGRESS NOTE  Patient Care Team: Morayati, Lourdes Sledge, MD as PCP - General (Endocrinology)  Cancer Staging No matching staging information was found for the patient.   Oncology History   1. Carcinoma of prostate diagnosis in 2006.  Had received radiation therapy, external beam to prostate and pelvic lymph nodes.  Had Gleason 9 (4+5.)  PainBaseline PSA was 13. 2. Received Lupron injection after her radiation therapy also.  PSA was going up.  Last year or so received Lupron injection in September but did not have any followup.   3. Recently (this August, 2012) PSA was found to be 17. Patient was started on Lupron from September of 2012. 4. Patient has progressive disease by PSA criteria (October, 2013) 5. Patient was started on Zytiga and prednisone in November of 2013. 6.traumatic hip fracture ((right femur) status post internally fixed ,no evidence of metastatic disease(May of 2014) 7.bone scan in July of 2014 at outside institutions shows metastases to left scapula and left 11th rib/.. 8.progressive On ZYTIGA by PSA criteria.  9[name], September of 2015- Progression- Jan 2016  # AUG 11th 2017- Provenge 2 of planned 3 [sec to logistical issues]  # SEP 2017- START DOCETAXEL weekly; CT C/A/P-Progressive bone mets [compared to 2016 CT]PSA PROGRESSION; NOV 2017BONE SCAN STABLE  # NOV 21st 2017-  CABAZITAXEL q 3 W; 05/02/2016-Added Carboplatin with cycle #3 [given rising PSA]   # June 13th XOFIGO #1  # Anemia on aranesp/IV iron [end of may 2018]  # FEB 2018- GUARDIANT TESTING- AR-7/ no BRCA or targettable mutations.      Prostate cancer metastatic to bone [facility])     INTERVAL HISTORY:  Preston Wilson 81 y.o.  male pleasant patient above history of Metastatic prostate cancer status post multiple lines of therapy Currently on xofigo is here for follow-up. Patient received cycle #3 approximately on  Aug 8th.  In the interim he has been  evaluated by oncology at [facility]; recommendations-is to continue current therapy; scan after 3 cycles.   He tolerated treatment well. No nausea no vomiting. He continues to deny any pain. Denies any loss of appetite. No diarrhea. No tingling or numbness. No fevers or chills. No cough. No swelling of the legs. No weight loss.  REVIEW OF SYSTEMS:  A complete 10 point review of system is done which is negative except mentioned above/history of present illness.   PAST MEDICAL HISTORY :  Past Medical History:  Diagnosis Date  . Asthma   . Heart murmur   . Hyperlipidemia   . Hypocalcemia   . Osteomyelitis ([place])   . Prostate cancer ([place])   . SOB (shortness of breath)     PAST SURGICAL HISTORY :   Past Surgical History:  Procedure Laterality Date  . COLONOSCOPY WITH PROPOFOL N/A 04/12/2015   Procedure: COLONOSCOPY WITH PROPOFOL;  Surgeon: Lollie Sails, MD;  Location: [facility] ENDOSCOPY;  Service: Endoscopy;  Laterality: N/A;  . EXCISIONAL HEMORRHOIDECTOMY    . TONSILLECTOMY      FAMILY HISTORY :   Family History  Problem Relation Age of Onset  . Colon cancer Father        deceased  . Prostate cancer Brother        deceased  . Other Child        neuroblastoma    SOCIAL HISTORY:   Social History  Substance Use Topics  . Smoking status: Current Every Day Smoker    Packs/day: 0.50  Types: Cigarettes  . Smokeless tobacco: Never Used  . Alcohol use No    ALLERGIES:  has No Known Allergies.  MEDICATIONS:  Current Outpatient Prescriptions  Medication Sig Dispense Refill  . ADVAIR DISKUS 100-50 MCG/DOSE AEPB inhale 1 dose by mouth twice a day  0  . calcium-vitamin D (OSCAL WITH D) 500-200 MG-UNIT per tablet Take 1 tablet by mouth daily with breakfast.     . ferrous sulfate 325 (65 FE) MG tablet Take 325 mg by mouth daily with breakfast.    . Leuprolide Acetate (LUPRON IJ) Inject 1 mL as directed every 3 (three) months.    . Multiple Vitamin (MULTIVITAMIN) tablet Take 1 tablet  by mouth daily.    . predniSONE (DELTASONE) 5 MG tablet Take 1 tablet (5 mg total) by mouth 2 (two) times daily with a meal. (Patient taking differently: Take 5 mg by mouth daily. ) 60 tablet 3  . Vitamin D, Ergocalciferol, (DRISDOL) 50000 UNITS CAPS capsule Take 50,000 Units by mouth once a week. Fridays  0  . ondansetron (ZOFRAN) 8 MG tablet 1 pill every 8 hours as needed for nausea/vomitting (Patient not taking: Reported on 12/27/2016) 40 tablet 1   No current facility-administered medications for this visit.     PHYSICAL EXAMINATION: ECOG PERFORMANCE STATUS: 0 - Asymptomatic  BP 113/69   Pulse 99   Temp 97.8 F (36.6 C) (Tympanic)   Resp 20   There were no vitals filed for this visit.  GENERAL: Well-nourished well-developed; Alert, no distress and comfortable.   Accompanied by family. EYES: no pallor or icterus OROPHARYNX: no thrush or ulceration; good dentition  NECK: supple, no masses felt LYMPH:  no palpable lymphadenopathy in the cervical, axillary or inguinal regions LUNGS: clear to auscultation and  No wheeze or crackles HEART/CVS: regular rate & rhythm and positive for murmur; No lower extremity edema ABDOMEN:abdomen soft, non-tender and normal bowel sounds Musculoskeletal:no cyanosis of digits and no clubbing  PSYCH: alert & oriented x 3 with fluent speech NEURO: no focal motor/sensory deficits SKIN:  no rashes or significant lesions  LABORATORY DATA:  I have reviewed the data as listed    Component Value Date/Time   NA 138 12/27/2016 1004   NA 138 08/25/2014 1442   K 4.4 12/27/2016 1004   K 4.1 08/25/2014 1442   CL 102 12/27/2016 1004   CL 106 08/25/2014 1442   CO2 27 12/27/2016 1004   CO2 25 08/25/2014 1442   GLUCOSE 142 (H) 12/27/2016 1004   GLUCOSE 138 (H) 08/25/2014 1442   BUN 17 12/27/2016 1004   BUN 19 08/25/2014 1442   CREATININE 0.60 (L) 12/27/2016 1004   CREATININE 0.53 (L) 08/25/2014 1442   CALCIUM 9.5 12/27/2016 1004   CALCIUM 9.0 08/25/2014  1442   PROT 7.1 12/27/2016 1004   PROT 6.8 08/25/2014 1442   ALBUMIN 4.2 12/27/2016 1004   ALBUMIN 3.9 08/25/2014 1442   AST 23 12/27/2016 1004   AST 18 08/25/2014 1442   ALT 15 (L) 12/27/2016 1004   ALT 13 (L) 08/25/2014 1442   ALKPHOS 67 12/27/2016 1004   ALKPHOS 73 08/25/2014 1442   BILITOT 0.9 12/27/2016 1004   BILITOT 0.7 08/25/2014 1442   GFRNONAA >60 12/27/2016 1004   GFRNONAA >60 08/25/2014 1442   GFRAA >60 12/27/2016 1004   GFRAA >60 08/25/2014 1442    No results found for: SPEP, UPEP  Lab Results  Component Value Date   WBC 2.7 (L) 12/27/2016   NEUTROABS 1.7 12/27/2016  HGB 10.3 (L) 12/27/2016   HCT 29.7 (L) 12/27/2016   MCV 98.5 12/27/2016   PLT 131 (L) 12/27/2016      Chemistry      Component Value Date/Time   NA 138 12/27/2016 1004   NA 138 08/25/2014 1442   K 4.4 12/27/2016 1004   K 4.1 08/25/2014 1442   CL 102 12/27/2016 1004   CL 106 08/25/2014 1442   CO2 27 12/27/2016 1004   CO2 25 08/25/2014 1442   BUN 17 12/27/2016 1004   BUN 19 08/25/2014 1442   CREATININE 0.60 (L) 12/27/2016 1004   CREATININE 0.53 (L) 08/25/2014 1442      Component Value Date/Time   CALCIUM 9.5 12/27/2016 1004   CALCIUM 9.0 08/25/2014 1442   ALKPHOS 67 12/27/2016 1004   ALKPHOS 73 08/25/2014 1442   AST 23 12/27/2016 1004   AST 18 08/25/2014 1442   ALT 15 (L) 12/27/2016 1004   ALT 13 (L) 08/25/2014 1442   BILITOT 0.9 12/27/2016 1004   BILITOT 0.7 08/25/2014 1442       Results for BRAYON, BIELEFELD (MRN [id number]) as of 12/03/2016 13:25  Ref. Range 08/15/2016 09:38 09/04/2016 10:30 10/04/2016 13:10 11/01/2016 07:57 11/29/2016 08:12  PSA Latest Ref Range: 0.00 - 4.00 ng/mL 2,400.00 (H) 2,814.00 (H) 2,151.00 (H)    Prostatic Specific Antigen Latest Ref Range: 0.00 - 4.00 ng/mL    1,998.00 (H) 1,911.00 (H)       RADIOGRAPHIC STUDIES: I have personally reviewed the radiological images as listed and agreed with the findings in the report. No results found.    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 09/05/2016 CT scan/bone scan shows stable/more sclerotic bone lesions. No evidence of any visceral metastases.  PSA- trending down [~1900]. Await for PSA from today.   # currently on xofigo s/p cycle # 3 on Aug 8th; due for cycle # 4 on 12th September.    # anemia sec to chemo- on aranesp 300 mcg q 2 w; also status post Venofer. Today hemoglobin is 10.3. Hold off Aranesp. Recommend IV iron.  # thrombocytopenia- 131- sec to xofigo. Monitor for now.  # Tachycardia- 99; beta-blocker is okay form oncology standpoint.    # Proceed with X-geva today [6/28]; Last Lupron q 29M- [last May 1st; again due in aug];   #CT scans /bone 24-25th of sep; and follow up with me few days later.   Labs-pt preference.   Cc; Dr.Kowalski.   Orders Placed This Encounter  Procedures  . CT ABDOMEN PELVIS W CONTRAST    Standing Status:   Future    Standing Expiration Date:   12/27/2017    Order Specific Question:   If indicated for the ordered procedure, I authorize the administration of contrast media per Radiology protocol    Answer:   Yes    Order Specific Question:   Preferred imaging location?    Answer:   [facility]    Order Specific Question:   Radiology Contrast Protocol - do NOT remove file path    Answer:   \\[email or web address]  . NM Bone Scan Whole Body    Standing Status:   Future    Standing Expiration Date:   12/27/2017    Order Specific Question:   If indicated for the ordered procedure, I authorize the administration of a radiopharmaceutical per Radiology protocol    Answer:   Yes    Order  Specific Question:   Preferred imaging location?    Answer:   [facility]    Order Specific Question:   Radiology Contrast Protocol - do NOT remove file path    Answer:   \\[email or web address]   . CBC with Differential    Standing Status:   Future    Standing Expiration Date:   12/27/2017  . Comprehensive metabolic panel    Standing Status:   Future    Standing Expiration Date:   12/27/2017  . PSA    Standing Status:   Future    Standing Expiration Date:   12/27/2017  . CBC with Differential    Standing Status:   Standing    Number of Occurrences:   4    Standing Expiration Date:   12/27/2017  . Hold Tube- Blood Bank    Standing Status:   Future    Standing Expiration Date:   12/27/2017  . Hold Tube- Blood Bank    Standing Status:   Standing    Number of Occurrences:   4    Standing Expiration Date:   12/27/2017     Cammie Sickle, MD 01/08/2017 8:03 AM

## 2016-12-27 NOTE — Assessment & Plan Note (Addendum)
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 09/05/2016 CT scan/bone scan shows stable/more sclerotic bone lesions. No evidence of any visceral metastases.  PSA- trending down [~1900]. Await for PSA from today.   # currently on xofigo s/p cycle # 3 on Aug 8th; due for cycle # 4 on 12th September.    # anemia sec to chemo- on aranesp 300 mcg q 2 w; also status post Venofer. Today hemoglobin is 10.3. Hold off Aranesp. Recommend IV iron.  # thrombocytopenia- 131- sec to xofigo. Monitor for now.  # Tachycardia- 99; beta-blocker is okay form oncology standpoint.    # Proceed with X-geva today [8/23]; Last Lupron q 34M- [last May 1st; again due in aug];   #CT scans /bone 24-25th of sep; and follow up with me few days later.   Labs-pt preference.   Cc; Dr.Kowalski.

## 2016-12-28 ENCOUNTER — Telehealth: Payer: Self-pay | Admitting: *Deleted

## 2016-12-28 NOTE — Telephone Encounter (Signed)
-----   Message from Cammie Sickle, MD sent at 12/28/2016  4:33 PM EDT ----- psa slightly up; monitor for now; no new recomemndations at this time. Thx

## 2016-12-28 NOTE — Telephone Encounter (Signed)
Patient's wife informed of results. 

## 2017-01-03 ENCOUNTER — Inpatient Hospital Stay: Payer: Medicare Other

## 2017-01-09 ENCOUNTER — Ambulatory Visit: Payer: Medicare Other | Admitting: Radiation Oncology

## 2017-01-09 ENCOUNTER — Inpatient Hospital Stay: Payer: Medicare Other | Attending: Radiation Oncology

## 2017-01-09 ENCOUNTER — Ambulatory Visit: Payer: Self-pay

## 2017-01-09 ENCOUNTER — Inpatient Hospital Stay: Payer: Medicare Other

## 2017-01-09 VITALS — BP 103/62

## 2017-01-09 DIAGNOSIS — Z79899 Other long term (current) drug therapy: Secondary | ICD-10-CM | POA: Diagnosis not present

## 2017-01-09 DIAGNOSIS — C7951 Secondary malignant neoplasm of bone: Secondary | ICD-10-CM | POA: Insufficient documentation

## 2017-01-09 DIAGNOSIS — Z79818 Long term (current) use of other agents affecting estrogen receptors and estrogen levels: Secondary | ICD-10-CM | POA: Insufficient documentation

## 2017-01-09 DIAGNOSIS — C61 Malignant neoplasm of prostate: Secondary | ICD-10-CM | POA: Diagnosis present

## 2017-01-09 LAB — CBC WITH DIFFERENTIAL/PLATELET
BASOS PCT: 0 %
Basophils Absolute: 0 10*3/uL (ref 0–0.1)
EOS ABS: 0 10*3/uL (ref 0–0.7)
Eosinophils Relative: 0 %
HCT: 28.6 % — ABNORMAL LOW (ref 40.0–52.0)
Hemoglobin: 10 g/dL — ABNORMAL LOW (ref 13.0–18.0)
LYMPHS ABS: 1.1 10*3/uL (ref 1.0–3.6)
Lymphocytes Relative: 37 %
MCH: 34.4 pg — AB (ref 26.0–34.0)
MCHC: 35 g/dL (ref 32.0–36.0)
MCV: 98.2 fL (ref 80.0–100.0)
MONO ABS: 0.2 10*3/uL (ref 0.2–1.0)
MONOS PCT: 7 %
Neutro Abs: 1.6 10*3/uL (ref 1.4–6.5)
Neutrophils Relative %: 56 %
Platelets: 138 10*3/uL — ABNORMAL LOW (ref 150–440)
RBC: 2.91 MIL/uL — ABNORMAL LOW (ref 4.40–5.90)
RDW: 16.2 % — AB (ref 11.5–14.5)
WBC: 2.9 10*3/uL — ABNORMAL LOW (ref 3.8–10.6)

## 2017-01-09 LAB — SAMPLE TO BLOOD BANK

## 2017-01-09 MED ORDER — DARBEPOETIN ALFA 300 MCG/0.6ML IJ SOSY
300.0000 ug | PREFILLED_SYRINGE | Freq: Once | INTRAMUSCULAR | Status: AC
  Administered 2017-01-09: 300 ug via SUBCUTANEOUS
  Filled 2017-01-09: qty 0.6

## 2017-01-10 ENCOUNTER — Encounter: Payer: Self-pay | Admitting: *Deleted

## 2017-01-10 ENCOUNTER — Inpatient Hospital Stay: Payer: Medicare Other

## 2017-01-10 VITALS — BP 121/74 | HR 104 | Temp 97.5°F | Resp 20

## 2017-01-10 DIAGNOSIS — C7951 Secondary malignant neoplasm of bone: Principal | ICD-10-CM

## 2017-01-10 DIAGNOSIS — C61 Malignant neoplasm of prostate: Secondary | ICD-10-CM | POA: Diagnosis not present

## 2017-01-10 LAB — CBC WITH DIFFERENTIAL/PLATELET
BASOS ABS: 0 10*3/uL (ref 0–0.1)
BASOS PCT: 0 %
EOS PCT: 0 %
Eosinophils Absolute: 0 10*3/uL (ref 0–0.7)
HCT: 29.1 % — ABNORMAL LOW (ref 40.0–52.0)
Hemoglobin: 10.1 g/dL — ABNORMAL LOW (ref 13.0–18.0)
Lymphocytes Relative: 28 %
Lymphs Abs: 0.9 10*3/uL — ABNORMAL LOW (ref 1.0–3.6)
MCH: 34.1 pg — ABNORMAL HIGH (ref 26.0–34.0)
MCHC: 34.6 g/dL (ref 32.0–36.0)
MCV: 98.4 fL (ref 80.0–100.0)
MONOS PCT: 8 %
Monocytes Absolute: 0.3 10*3/uL (ref 0.2–1.0)
Neutro Abs: 2 10*3/uL (ref 1.4–6.5)
Neutrophils Relative %: 64 %
PLATELETS: 135 10*3/uL — AB (ref 150–440)
RBC: 2.96 MIL/uL — ABNORMAL LOW (ref 4.40–5.90)
RDW: 16.2 % — AB (ref 11.5–14.5)
WBC: 3.2 10*3/uL — ABNORMAL LOW (ref 3.8–10.6)

## 2017-01-10 MED ORDER — LEUPROLIDE ACETATE (3 MONTH) 22.5 MG IM KIT
22.5000 mg | PACK | Freq: Once | INTRAMUSCULAR | Status: AC
  Administered 2017-01-10: 22.5 mg via INTRAMUSCULAR
  Filled 2017-01-10: qty 22.5

## 2017-01-15 ENCOUNTER — Inpatient Hospital Stay: Payer: Medicare Other

## 2017-01-15 VITALS — BP 108/66 | HR 90

## 2017-01-15 DIAGNOSIS — C61 Malignant neoplasm of prostate: Secondary | ICD-10-CM

## 2017-01-15 DIAGNOSIS — C7951 Secondary malignant neoplasm of bone: Principal | ICD-10-CM

## 2017-01-15 LAB — CBC WITH DIFFERENTIAL/PLATELET
BASOS ABS: 0 10*3/uL (ref 0–0.1)
Basophils Relative: 1 %
EOS PCT: 1 %
Eosinophils Absolute: 0 10*3/uL (ref 0–0.7)
HEMATOCRIT: 28.3 % — AB (ref 40.0–52.0)
Hemoglobin: 9.9 g/dL — ABNORMAL LOW (ref 13.0–18.0)
Lymphocytes Relative: 27 %
Lymphs Abs: 0.9 10*3/uL — ABNORMAL LOW (ref 1.0–3.6)
MCH: 34.6 pg — ABNORMAL HIGH (ref 26.0–34.0)
MCHC: 35 g/dL (ref 32.0–36.0)
MCV: 98.9 fL (ref 80.0–100.0)
MONO ABS: 0.3 10*3/uL (ref 0.2–1.0)
MONOS PCT: 9 %
Neutro Abs: 2.2 10*3/uL (ref 1.4–6.5)
Neutrophils Relative %: 62 %
PLATELETS: 146 10*3/uL — AB (ref 150–440)
RBC: 2.86 MIL/uL — ABNORMAL LOW (ref 4.40–5.90)
RDW: 16.1 % — AB (ref 11.5–14.5)
WBC: 3.5 10*3/uL — ABNORMAL LOW (ref 3.8–10.6)

## 2017-01-15 LAB — SAMPLE TO BLOOD BANK

## 2017-01-15 MED ORDER — DARBEPOETIN ALFA 300 MCG/0.6ML IJ SOSY
300.0000 ug | PREFILLED_SYRINGE | Freq: Once | INTRAMUSCULAR | Status: AC
  Administered 2017-01-15: 300 ug via SUBCUTANEOUS
  Filled 2017-01-15: qty 0.6

## 2017-01-16 ENCOUNTER — Ambulatory Visit
Admission: RE | Admit: 2017-01-16 | Discharge: 2017-01-16 | Disposition: A | Discharge status: Home / Self Care | Payer: Medicare Other | Source: Ambulatory Visit | Attending: Radiation Oncology | Admitting: Radiation Oncology

## 2017-01-16 ENCOUNTER — Ambulatory Visit: Payer: Medicare Other | Admitting: Radiation Oncology

## 2017-01-16 DIAGNOSIS — C61 Malignant neoplasm of prostate: Secondary | ICD-10-CM | POA: Diagnosis not present

## 2017-01-16 DIAGNOSIS — C7951 Secondary malignant neoplasm of bone: Secondary | ICD-10-CM | POA: Insufficient documentation

## 2017-01-16 MED ORDER — RADIUM RA 223 DICHLORIDE 30 MCCI/ML IV SOLN
123.9000 | Freq: Once | INTRAVENOUS | Status: AC
  Administered 2017-01-16: 122.9 via INTRAVENOUS

## 2017-01-16 NOTE — Progress Notes (Signed)
Radiation Oncology Xofigo infusion Note  Name: Preston Wilson   Date:   01/16/2017 MRN:  [id number] DOB: November 05, 1935    This 81 y.o. male presents to the clinic today for fourth infusion ofXofigo.  REFERRING PROVIDER: Noreene Filbert, MD  HPI: Patient is a 81 year old male seen today for his fourth infusion of Xofigo. He's been doing well pain is under excellent control. He is scheduled for a bone scan I believe next week. He otherwise is doing well is had no problems with his first 3 injections. Patient does have stage IV hormone refractory prostate cancer with bone metastasis..  COMPLICATIONS OF TREATMENT: none  FOLLOW UP COMPLIANCE: keeps appointments   PHYSICAL EXAM:  There were no vitals taken for this visit. Well-developed well-nourished patient in NAD. HEENT reveals PERLA, EOMI, discs not visualized.  Oral cavity is clear. No oral mucosal lesions are identified. Neck is clear without evidence of cervical or supraclavicular adenopathy. Lungs are clear to A&P. Cardiac examination is essentially unremarkable with regular rate and rhythm without murmur rub or thrill. Abdomen is benign with no organomegaly or masses noted. Motor sensory and DTR levels are equal and symmetric in the upper and lower extremities. Cranial nerves II through XII are grossly intact. Proprioception is intact. No peripheral adenopathy or edema is identified. No motor or sensory levels are noted. Crude visual fields are within normal range.  RADIOLOGY RESULTS: No current films to review  PLAN: Peripheral line was started on the patient and 20 cc of normal saline were passed to make sure there was patency of the lines. Trudi Ida was administered over a 5 minute infusion push by nuclear medicine technologist supervised by radiation oncologist. After completion of IV push of Xofigo 30 cc an additional saline were passed through the peripheral line. Oral lines syringes drapes and original container of Xofigo were  then taken to nuclear medicine for storage. Patient tolerated the procedure well without side effect or complaint. Patient has anti-emetic medication. Have scheduled the patient for a three-week followup to check on his counts. Patient is to call sooner with any side effects or complaints.    Armstead Peaks., MD

## 2017-01-17 ENCOUNTER — Other Ambulatory Visit: Payer: Self-pay

## 2017-01-22 ENCOUNTER — Inpatient Hospital Stay: Payer: Medicare Other

## 2017-01-22 DIAGNOSIS — C7951 Secondary malignant neoplasm of bone: Principal | ICD-10-CM

## 2017-01-22 DIAGNOSIS — C61 Malignant neoplasm of prostate: Secondary | ICD-10-CM

## 2017-01-22 LAB — CBC WITH DIFFERENTIAL/PLATELET
BASOS ABS: 0 10*3/uL (ref 0–0.1)
BASOS PCT: 0 %
EOS PCT: 0 %
Eosinophils Absolute: 0 10*3/uL (ref 0–0.7)
HCT: 30.1 % — ABNORMAL LOW (ref 40.0–52.0)
Hemoglobin: 10.4 g/dL — ABNORMAL LOW (ref 13.0–18.0)
Lymphocytes Relative: 26 %
Lymphs Abs: 0.8 10*3/uL — ABNORMAL LOW (ref 1.0–3.6)
MCH: 34.3 pg — ABNORMAL HIGH (ref 26.0–34.0)
MCHC: 34.6 g/dL (ref 32.0–36.0)
MCV: 99.1 fL (ref 80.0–100.0)
MONO ABS: 0.2 10*3/uL (ref 0.2–1.0)
Monocytes Relative: 8 %
NEUTROS ABS: 1.9 10*3/uL (ref 1.4–6.5)
Neutrophils Relative %: 66 %
PLATELETS: 141 10*3/uL — AB (ref 150–440)
RBC: 3.04 MIL/uL — AB (ref 4.40–5.90)
RDW: 16.3 % — AB (ref 11.5–14.5)
WBC: 3 10*3/uL — AB (ref 3.8–10.6)

## 2017-01-22 LAB — SAMPLE TO BLOOD BANK

## 2017-01-23 ENCOUNTER — Ambulatory Visit: Payer: Self-pay | Admitting: Radiation Oncology

## 2017-01-29 ENCOUNTER — Encounter
Admission: RE | Admit: 2017-01-29 | Discharge: 2017-01-29 | Disposition: A | Discharge status: Home / Self Care | Payer: Medicare Other | Source: Ambulatory Visit | Attending: Internal Medicine | Admitting: Internal Medicine

## 2017-01-29 ENCOUNTER — Ambulatory Visit
Admission: RE | Admit: 2017-01-29 | Discharge: 2017-01-29 | Disposition: A | Discharge status: Home / Self Care | Payer: Medicare Other | Source: Ambulatory Visit | Attending: Internal Medicine | Admitting: Internal Medicine

## 2017-01-29 ENCOUNTER — Inpatient Hospital Stay: Payer: Medicare Other

## 2017-01-29 VITALS — BP 105/65

## 2017-01-29 DIAGNOSIS — I700 Atherosclerosis of aorta: Secondary | ICD-10-CM | POA: Insufficient documentation

## 2017-01-29 DIAGNOSIS — C7951 Secondary malignant neoplasm of bone: Principal | ICD-10-CM

## 2017-01-29 DIAGNOSIS — C61 Malignant neoplasm of prostate: Secondary | ICD-10-CM | POA: Insufficient documentation

## 2017-01-29 DIAGNOSIS — M47816 Spondylosis without myelopathy or radiculopathy, lumbar region: Secondary | ICD-10-CM | POA: Diagnosis not present

## 2017-01-29 DIAGNOSIS — M5136 Other intervertebral disc degeneration, lumbar region: Secondary | ICD-10-CM | POA: Insufficient documentation

## 2017-01-29 LAB — CBC WITH DIFFERENTIAL/PLATELET
Basophils Absolute: 0 10*3/uL (ref 0–0.1)
Basophils Relative: 0 %
EOS PCT: 0 %
Eosinophils Absolute: 0 10*3/uL (ref 0–0.7)
HEMATOCRIT: 28 % — AB (ref 40.0–52.0)
HEMOGLOBIN: 9.7 g/dL — AB (ref 13.0–18.0)
LYMPHS ABS: 1 10*3/uL (ref 1.0–3.6)
LYMPHS PCT: 36 %
MCH: 34.5 pg — AB (ref 26.0–34.0)
MCHC: 34.7 g/dL (ref 32.0–36.0)
MCV: 99.5 fL (ref 80.0–100.0)
Monocytes Absolute: 0.2 10*3/uL (ref 0.2–1.0)
Monocytes Relative: 8 %
NEUTROS ABS: 1.6 10*3/uL (ref 1.4–6.5)
Neutrophils Relative %: 56 %
PLATELETS: 121 10*3/uL — AB (ref 150–440)
RBC: 2.81 MIL/uL — AB (ref 4.40–5.90)
RDW: 16.3 % — ABNORMAL HIGH (ref 11.5–14.5)
WBC: 2.8 10*3/uL — AB (ref 3.8–10.6)

## 2017-01-29 LAB — SAMPLE TO BLOOD BANK

## 2017-01-29 MED ORDER — IOPAMIDOL (ISOVUE-300) INJECTION 61%
100.0000 mL | Freq: Once | INTRAVENOUS | Status: AC | PRN
  Administered 2017-01-29: 100 mL via INTRAVENOUS

## 2017-01-29 MED ORDER — TECHNETIUM TC 99M MEDRONATE IV KIT
25.0000 | PACK | Freq: Once | INTRAVENOUS | Status: AC | PRN
  Administered 2017-01-29: 24.34 via INTRAVENOUS

## 2017-01-29 MED ORDER — DARBEPOETIN ALFA 300 MCG/0.6ML IJ SOSY
300.0000 ug | PREFILLED_SYRINGE | Freq: Once | INTRAMUSCULAR | Status: AC
  Administered 2017-01-29: 300 ug via SUBCUTANEOUS
  Filled 2017-01-29: qty 0.6

## 2017-01-30 ENCOUNTER — Telehealth: Payer: Self-pay | Admitting: Internal Medicine

## 2017-01-30 NOTE — Telephone Encounter (Signed)
Please have the patient see me next Thursday 10/04 [lab already scheduled]; to review results of the CT scan and bone scans.

## 2017-01-31 ENCOUNTER — Other Ambulatory Visit: Payer: Self-pay

## 2017-01-31 NOTE — Telephone Encounter (Signed)
Colette, please schedule patient next Thursday 02/07/17 for MD visit to review results of CT and bone scans. Thanks!

## 2017-02-06 ENCOUNTER — Ambulatory Visit: Payer: Self-pay

## 2017-02-06 ENCOUNTER — Ambulatory Visit: Payer: Medicare Other | Admitting: Radiation Oncology

## 2017-02-07 ENCOUNTER — Inpatient Hospital Stay (HOSPITAL_BASED_OUTPATIENT_CLINIC_OR_DEPARTMENT_OTHER): Payer: Medicare Other | Admitting: Internal Medicine

## 2017-02-07 ENCOUNTER — Inpatient Hospital Stay: Payer: Medicare Other | Attending: Internal Medicine

## 2017-02-07 VITALS — BP 118/68 | HR 106 | Temp 97.6°F | Resp 18 | Ht 72.0 in | Wt 171.4 lb

## 2017-02-07 DIAGNOSIS — D696 Thrombocytopenia, unspecified: Secondary | ICD-10-CM | POA: Insufficient documentation

## 2017-02-07 DIAGNOSIS — R011 Cardiac murmur, unspecified: Secondary | ICD-10-CM

## 2017-02-07 DIAGNOSIS — I700 Atherosclerosis of aorta: Secondary | ICD-10-CM | POA: Insufficient documentation

## 2017-02-07 DIAGNOSIS — Z923 Personal history of irradiation: Secondary | ICD-10-CM

## 2017-02-07 DIAGNOSIS — Z9221 Personal history of antineoplastic chemotherapy: Secondary | ICD-10-CM | POA: Insufficient documentation

## 2017-02-07 DIAGNOSIS — R0602 Shortness of breath: Secondary | ICD-10-CM | POA: Insufficient documentation

## 2017-02-07 DIAGNOSIS — E785 Hyperlipidemia, unspecified: Secondary | ICD-10-CM | POA: Insufficient documentation

## 2017-02-07 DIAGNOSIS — M5137 Other intervertebral disc degeneration, lumbosacral region: Secondary | ICD-10-CM | POA: Diagnosis not present

## 2017-02-07 DIAGNOSIS — Z800 Family history of malignant neoplasm of digestive organs: Secondary | ICD-10-CM

## 2017-02-07 DIAGNOSIS — Z79899 Other long term (current) drug therapy: Secondary | ICD-10-CM

## 2017-02-07 DIAGNOSIS — C61 Malignant neoplasm of prostate: Secondary | ICD-10-CM

## 2017-02-07 DIAGNOSIS — C7951 Secondary malignant neoplasm of bone: Secondary | ICD-10-CM

## 2017-02-07 DIAGNOSIS — D6481 Anemia due to antineoplastic chemotherapy: Secondary | ICD-10-CM

## 2017-02-07 DIAGNOSIS — F17210 Nicotine dependence, cigarettes, uncomplicated: Secondary | ICD-10-CM | POA: Diagnosis not present

## 2017-02-07 DIAGNOSIS — M869 Osteomyelitis, unspecified: Secondary | ICD-10-CM | POA: Insufficient documentation

## 2017-02-07 DIAGNOSIS — Z8781 Personal history of (healed) traumatic fracture: Secondary | ICD-10-CM | POA: Insufficient documentation

## 2017-02-07 DIAGNOSIS — J45909 Unspecified asthma, uncomplicated: Secondary | ICD-10-CM | POA: Insufficient documentation

## 2017-02-07 LAB — COMPREHENSIVE METABOLIC PANEL
ALBUMIN: 3.9 g/dL (ref 3.5–5.0)
ALT: 14 U/L — AB (ref 17–63)
AST: 24 U/L (ref 15–41)
Alkaline Phosphatase: 57 U/L (ref 38–126)
Anion gap: 11 (ref 5–15)
BUN: 16 mg/dL (ref 6–20)
CHLORIDE: 101 mmol/L (ref 101–111)
CO2: 25 mmol/L (ref 22–32)
CREATININE: 0.57 mg/dL — AB (ref 0.61–1.24)
Calcium: 9.1 mg/dL (ref 8.9–10.3)
GFR calc Af Amer: >60 mL/min (ref >60)
GLUCOSE: 144 mg/dL — AB (ref 65–99)
POTASSIUM: 3.9 mmol/L (ref 3.5–5.1)
Sodium: 137 mmol/L (ref 135–145)
Total Bilirubin: 0.7 mg/dL (ref 0.3–1.2)
Total Protein: 6.8 g/dL (ref 6.5–8.1)

## 2017-02-07 LAB — CBC WITH DIFFERENTIAL/PLATELET
BASOS ABS: 0 10*3/uL (ref 0–0.1)
BASOS PCT: 1 %
EOS PCT: 1 %
Eosinophils Absolute: 0 10*3/uL (ref 0–0.7)
HCT: 28.3 % — ABNORMAL LOW (ref 40.0–52.0)
Hemoglobin: 9.9 g/dL — ABNORMAL LOW (ref 13.0–18.0)
LYMPHS PCT: 27 %
Lymphs Abs: 0.6 10*3/uL — ABNORMAL LOW (ref 1.0–3.6)
MCH: 34.9 pg — ABNORMAL HIGH (ref 26.0–34.0)
MCHC: 34.8 g/dL (ref 32.0–36.0)
MCV: 100.3 fL — AB (ref 80.0–100.0)
Monocytes Absolute: 0.2 10*3/uL (ref 0.2–1.0)
Monocytes Relative: 8 %
NEUTROS ABS: 1.5 10*3/uL (ref 1.4–6.5)
Neutrophils Relative %: 65 %
PLATELETS: 131 10*3/uL — AB (ref 150–440)
RBC: 2.82 MIL/uL — AB (ref 4.40–5.90)
RDW: 16.4 % — AB (ref 11.5–14.5)
WBC: 2.4 10*3/uL — AB (ref 3.8–10.6)

## 2017-02-07 LAB — SAMPLE TO BLOOD BANK

## 2017-02-07 LAB — PSA: Prostatic Specific Antigen: 1346 ng/mL — ABNORMAL HIGH (ref 0.00–4.00)

## 2017-02-07 NOTE — Progress Notes (Signed)
[place] OFFICE PROGRESS NOTE  Patient Care Team: Wilson, Preston Sledge, MD as PCP - General (Endocrinology)  Cancer Staging No matching staging information was found for the patient.   Oncology History   1. Carcinoma of prostate diagnosis in 2006.  Had received radiation therapy, external beam to prostate and pelvic lymph nodes.  Had Gleason 9 (4+5.)  PainBaseline PSA was 13. 2. Received Lupron injection after her radiation therapy also.  PSA was going up.  Last year or so received Lupron injection in September but did not have any followup.   3. Recently (this August, 2012) PSA was found to be 17. Patient was started on Lupron from September of 2012. 4. Patient has progressive disease by PSA criteria (October, 2013) 5. Patient was started on Zytiga and prednisone in November of 2013. 6.traumatic hip fracture ((right femur) status post internally fixed ,no evidence of metastatic disease(May of 2014) 7.bone scan in July of 2014 at outside institutions shows metastases to left scapula and left 11th rib/.. 8.progressive On ZYTIGA by PSA criteria.  9Gillermina Wilson, September of 2015- Progression- Jan 2016  # AUG 11th 2017- Provenge 2 of planned 3 [sec to logistical issues]  # SEP 2017- START DOCETAXEL weekly; CT C/A/P-Progressive bone mets [compared to 2016 CT]PSA PROGRESSION; NOV 2017BONE SCAN STABLE  # NOV 21st 2017-  CABAZITAXEL q 3 W; 05/02/2016-Added Carboplatin with cycle #3 [given rising PSA]   # June 13th XOFIGO #1  # Anemia on aranesp/IV iron [end of may 2018]  # FEB 2018- GUARDIANT TESTING- AR-7/ no BRCA or targettable mutations.      Prostate cancer metastatic to bone [facility])     INTERVAL HISTORY:  Preston Wilson 81 y.o.  male pleasant patient above history of Metastatic prostate cancer status post multiple lines of therapy Currently on xofigo is here for follow-up. Patient received cycle #4 approximately on  1st week of September; Is here to  review the results of his restaging CAT scan and bone scan.  He tolerated treatment well. No nausea no vomiting. He continues to deny any pain. Denies any loss of appetite. No diarrhea. No tingling or numbness. No fevers or chills. No cough. No swelling of the legs. No weight loss.  REVIEW OF SYSTEMS:  A complete 10 point review of system is done which is negative except mentioned above/history of present illness.   PAST MEDICAL HISTORY :  Past Medical History:  Diagnosis Date  . Asthma   . Heart murmur   . Hyperlipidemia   . Hypocalcemia   . Osteomyelitis ([facility])   . Prostate cancer (Preston Wilson)   . SOB (shortness of breath)     PAST SURGICAL HISTORY :   Past Surgical History:  Procedure Laterality Date  . COLONOSCOPY WITH PROPOFOL N/A 04/12/2015   Procedure: COLONOSCOPY WITH PROPOFOL;  Surgeon: Preston Sails, MD;  Location: [facility];  Service: Endoscopy;  Laterality: N/A;  . EXCISIONAL HEMORRHOIDECTOMY    . TONSILLECTOMY      FAMILY HISTORY :   Family History  Problem Relation Age of Onset  . Colon cancer Father        deceased  . Prostate cancer Brother        deceased  . Other Child        neuroblastoma    SOCIAL HISTORY:   Social History  Substance Use Topics  . Smoking status: Current Every Day Smoker    Packs/day: 0.50    Types: Cigarettes  . Smokeless tobacco:  Never Used  . Alcohol use No    ALLERGIES:  has No Known Allergies.  MEDICATIONS:  Current Outpatient Prescriptions  Medication Sig Dispense Refill  . ADVAIR DISKUS 100-50 MCG/DOSE AEPB inhale 1 dose by mouth twice a day  0  . calcium-vitamin D (OSCAL WITH D) 500-200 MG-UNIT per tablet Take 1 tablet by mouth daily with breakfast.     . ferrous sulfate 325 (65 FE) MG tablet Take 325 mg by mouth daily with breakfast.    . Leuprolide Acetate (LUPRON IJ) Inject 1 mL as directed every 3 (three) months.    . Multiple Vitamin (MULTIVITAMIN) tablet Take 1 tablet by mouth daily.    . predniSONE  (DELTASONE) 5 MG tablet Take 1 tablet (5 mg total) by mouth 2 (two) times daily with a meal. (Patient taking differently: Take 5 mg by mouth daily. ) 60 tablet 3  . Vitamin D, Ergocalciferol, (DRISDOL) 50000 UNITS CAPS capsule Take 50,000 Units by mouth once a week. Fridays  0  . ondansetron (ZOFRAN) 8 MG tablet 1 pill every 8 hours as needed for nausea/vomitting (Patient not taking: Reported on 12/27/2016) 40 tablet 1   No current facility-administered medications for this visit.     PHYSICAL EXAMINATION: ECOG PERFORMANCE STATUS: 0 - Asymptomatic  BP 118/68 (BP Location: Left Arm, Patient Position: Sitting)   Pulse (!) 106   Temp 97.6 F (36.4 C) (Tympanic)   Resp 18   Ht 6' (1.829 m)   Wt 171 lb 6.4 oz (77.7 kg)   BMI 23.25 kg/m   Filed Weights   02/07/17 0911  Weight: 171 lb 6.4 oz (77.7 kg)    GENERAL: Well-nourished well-developed; Alert, no distress and comfortable.   Accompanied by family. EYES: no pallor or icterus OROPHARYNX: no thrush or ulceration; good dentition  NECK: supple, no masses felt LYMPH:  no palpable lymphadenopathy in the cervical, axillary or inguinal regions LUNGS: clear to auscultation and  No wheeze or crackles HEART/CVS: regular rate & rhythm and positive for murmur; No lower extremity edema ABDOMEN:abdomen soft, non-tender and normal bowel sounds Musculoskeletal:no cyanosis of digits and no clubbing  PSYCH: alert & oriented x 3 with fluent speech NEURO: no focal motor/sensory deficits SKIN:  no rashes or significant lesions  LABORATORY DATA:  I have reviewed the data as listed    Component Value Date/Time   NA 137 02/07/2017 0900   NA 138 08/25/2014 1442   K 3.9 02/07/2017 0900   K 4.1 08/25/2014 1442   CL 101 02/07/2017 0900   CL 106 08/25/2014 1442   CO2 25 02/07/2017 0900   CO2 25 08/25/2014 1442   GLUCOSE 144 (H) 02/07/2017 0900   GLUCOSE 138 (H) 08/25/2014 1442   BUN 16 02/07/2017 0900   BUN 19 08/25/2014 1442   CREATININE 0.57  (L) 02/07/2017 0900   CREATININE 0.53 (L) 08/25/2014 1442   CALCIUM 9.1 02/07/2017 0900   CALCIUM 9.0 08/25/2014 1442   PROT 6.8 02/07/2017 0900   PROT 6.8 08/25/2014 1442   ALBUMIN 3.9 02/07/2017 0900   ALBUMIN 3.9 08/25/2014 1442   AST 24 02/07/2017 0900   AST 18 08/25/2014 1442   ALT 14 (L) 02/07/2017 0900   ALT 13 (L) 08/25/2014 1442   ALKPHOS 57 02/07/2017 0900   ALKPHOS 73 08/25/2014 1442   BILITOT 0.7 02/07/2017 0900   BILITOT 0.7 08/25/2014 1442   GFRNONAA >60 02/07/2017 0900   GFRNONAA >60 08/25/2014 1442   GFRAA >60 02/07/2017 0900   GFRAA >  60 08/25/2014 1442    No results found for: SPEP, UPEP  Lab Results  Component Value Date   WBC 2.4 (L) 02/07/2017   NEUTROABS 1.5 02/07/2017   HGB 9.9 (L) 02/07/2017   HCT 28.3 (L) 02/07/2017   MCV 100.3 (H) 02/07/2017   PLT 131 (L) 02/07/2017      Chemistry      Component Value Date/Time   NA 137 02/07/2017 0900   NA 138 08/25/2014 1442   K 3.9 02/07/2017 0900   K 4.1 08/25/2014 1442   CL 101 02/07/2017 0900   CL 106 08/25/2014 1442   CO2 25 02/07/2017 0900   CO2 25 08/25/2014 1442   BUN 16 02/07/2017 0900   BUN 19 08/25/2014 1442   CREATININE 0.57 (L) 02/07/2017 0900   CREATININE 0.53 (L) 08/25/2014 1442      Component Value Date/Time   CALCIUM 9.1 02/07/2017 0900   CALCIUM 9.0 08/25/2014 1442   ALKPHOS 57 02/07/2017 0900   ALKPHOS 73 08/25/2014 1442   AST 24 02/07/2017 0900   AST 18 08/25/2014 1442   ALT 14 (L) 02/07/2017 0900   ALT 13 (L) 08/25/2014 1442   BILITOT 0.7 02/07/2017 0900   BILITOT 0.7 08/25/2014 1442       Results for REED, DADY (MRN [id number]) as of 02/07/2017 08:50  Ref. Range 09/04/2016 10:30 10/04/2016 13:10 11/01/2016 07:57 11/29/2016 08:12 12/27/2016 10:04  PSA Latest Ref Range: 0.00 - 4.00 ng/mL 2,814.00 (H) 2,151.00 (H)     Prostatic Specific Antigen Latest Ref Range: 0.00 - 4.00 ng/mL   1,998.00 (H) 1,911.00 (H) 2,109.00 (H)   IMPRESSION: 1. Progressive sclerosis  of previously existing widespread osseous metastatic disease. Increase in sclerosis in this fashion is nonspecific but certainly can be seen in setting of response to therapy. 2. Questionable fracture of the left sacral ala, nondisplaced, correlate with any tenderness along the left posterior sacrum. 3. No adenopathy or prostate gland enlargement. 4. Contrast in the distal esophagus potentially from dysmotility or reflux. 5. Lumbar spondylosis and degenerative disc disease with impingement at L5-S 1. 6.  Aortic Atherosclerosis (ICD10-I70.0).   Electronically Signed   By: Van Clines M.D.   On: 01/29/2017 12:50 --------------------------------------------------------------------     IMPRESSION: Persistent widespread bony metastatic disease without appreciable change from prior studies. Renal uptake is noted bilaterally.   Electronically Signed   By: Lowella Grip III M.D.   On: 01/29/2017 14:40  --------------------------------------------------------------------    RADIOGRAPHIC STUDIES: I have personally reviewed the radiological images as listed and agreed with the findings in the report. No results found.   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 01/29/2017 CT scan/bone scan shows stable/more sclerotic bone lesions. No evidence of any visceral metastases. Bone scan- STABLE.  PSA- Stable [1900-2000].   # currently on xofigo ; due for cycle # 5 on 10th October; #6 planned Nov 7th [labs on 1st Nov]  # anemia sec to chemo- on aranesp 300 mcg q 2 w; also status post Venofer. Today hemoglobin is 9.9.   # thrombocytopenia- 121- sec to xofigo. Monitor for now.   # Proceed with X-geva today [8/23; plan next week-2nd week of october]; Last Lupron q 42M- [last 09/06; again due in dec 2018];   # next week- x-geva/aranesp; every 2 w; follow up with me 4  weeks/ aranesp/labs/PSA.  Cc; Dr.McNamara.   Orders Placed This Encounter  Procedures  .  CBC with Differential    Standing Status:   Future    Standing Expiration Date:   02/07/2018  . Comprehensive metabolic panel    Standing Status:   Future    Standing Expiration Date:   02/07/2018  . PSA    Standing Status:   Future    Standing Expiration Date:   02/07/2018  . CBC with Differential    Standing Status:   Future    Standing Expiration Date:   02/07/2018     Cammie Sickle, MD 02/07/2017 9:59 AM

## 2017-02-07 NOTE — Assessment & Plan Note (Addendum)
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 01/29/2017 CT scan/bone scan shows stable/more sclerotic bone lesions. No evidence of any visceral metastases. Bone scan- STABLE.  PSA- Stable [1900-2000].   # currently on xofigo ; due for cycle # 5 on 10th October; #6 planned Nov 7th [labs on 1st Nov]  # anemia sec to chemo- on aranesp 300 mcg q 2 w; also status post Venofer. Today hemoglobin is 9.9.   # thrombocytopenia- 121- sec to xofigo. Monitor for now.   # Proceed with X-geva today [8/23; plan next week-2nd week of october]; Last Lupron q 62M- [last 09/06; again due in dec 2018];   # next week- x-geva/aranesp; every 2 w; follow up with me 4 weeks/ aranesp/labs/PSA.  Addendum: PSA- significantly improved at 1346.  Cc; Dr.McNamara.

## 2017-02-08 ENCOUNTER — Telehealth: Payer: Self-pay | Admitting: *Deleted

## 2017-02-08 NOTE — Telephone Encounter (Signed)
-----   Message from Cammie Sickle, MD sent at 02/08/2017  7:54 AM EDT ----- Please inform patient that PSA is improving; continue current plan. No new recommendations.

## 2017-02-08 NOTE — Telephone Encounter (Signed)
Spoke with patient's wife - results provided to pt's wife.

## 2017-02-13 ENCOUNTER — Ambulatory Visit
Admission: RE | Admit: 2017-02-13 | Discharge: 2017-02-13 | Disposition: A | Discharge status: Home / Self Care | Payer: Medicare Other | Source: Ambulatory Visit | Attending: Radiation Oncology | Admitting: Radiation Oncology

## 2017-02-13 DIAGNOSIS — C7951 Secondary malignant neoplasm of bone: Secondary | ICD-10-CM | POA: Diagnosis present

## 2017-02-13 DIAGNOSIS — C61 Malignant neoplasm of prostate: Secondary | ICD-10-CM

## 2017-02-13 DIAGNOSIS — Z923 Personal history of irradiation: Secondary | ICD-10-CM | POA: Insufficient documentation

## 2017-02-13 MED ORDER — RADIUM RA 223 DICHLORIDE 30 MCCI/ML IV SOLN
121.0000 | Freq: Once | INTRAVENOUS | Status: AC
  Administered 2017-02-13: 121 via INTRAVENOUS

## 2017-02-13 NOTE — Progress Notes (Signed)
Radiation Oncology Follow up Note  Name: [facility]   Date:   02/13/2017 MRN:  [id number] DOB: 03/11/1936    This 81 y.o. male presents to the Hospital today for fifth out of 6 Xofigoinfusion  REFERRING PROVIDER: Morayati, Lourdes Sledge, MD  HPI: patient is a 81 year old male here today for his fifth infusion of Xofigo. He is doing well.he is in [place] no pain at this time. He does have stage IV hormone refractory prostate cancer with bone metastasis.  COMPLICATIONS OF TREATMENT: none  FOLLOW UP COMPLIANCE: keeps appointments   PHYSICAL EXAM:  There were no vitals taken for this visit. Well-developed well-nourished patient in NAD. HEENT reveals PERLA, EOMI, discs not visualized.  Oral cavity is clear. No oral mucosal lesions are identified. Neck is clear without evidence of cervical or supraclavicular adenopathy. Lungs are clear to A&P. Cardiac examination is essentially unremarkable with regular rate and rhythm without murmur rub or thrill. Abdomen is benign with no organomegaly or masses noted. Motor sensory and DTR levels are equal and symmetric in the upper and lower extremities. Cranial nerves II through XII are grossly intact. Proprioception is intact. No peripheral adenopathy or edema is identified. No motor or sensory levels are noted. Crude visual fields are within normal range.  RADIOLOGY RESULTS: no current films for review  PLAN: Peripheral line was started on the patient and 20 cc of normal saline were passed to make sure there was patency of the lines. Trudi Ida was administered over a 5 minute infusion push by nuclear medicine technologist supervised by radiation oncologist. After completion of IV push of Xofigo 30 cc an additional saline were passed through the peripheral line. Oral lines syringes drapes and original container of Xofigo were then taken to nuclear medicine for storage. Patient tolerated the procedure well without side effect or complaint. Patient has  anti-emetic medication. Have scheduled the patient for a three-week followup to check on his counts. Patient is to call sooner with any side effects or complaints.    Armstead Peaks., MD

## 2017-02-14 ENCOUNTER — Inpatient Hospital Stay: Payer: Medicare Other

## 2017-02-14 ENCOUNTER — Other Ambulatory Visit: Payer: Self-pay

## 2017-02-14 VITALS — BP 128/68 | HR 30

## 2017-02-14 DIAGNOSIS — C61 Malignant neoplasm of prostate: Secondary | ICD-10-CM

## 2017-02-14 DIAGNOSIS — C7951 Secondary malignant neoplasm of bone: Principal | ICD-10-CM

## 2017-02-14 MED ORDER — DARBEPOETIN ALFA 300 MCG/0.6ML IJ SOSY
300.0000 ug | PREFILLED_SYRINGE | Freq: Once | INTRAMUSCULAR | Status: AC
  Administered 2017-02-14: 300 ug via SUBCUTANEOUS
  Filled 2017-02-14: qty 0.6

## 2017-02-14 MED ORDER — DENOSUMAB 120 MG/1.7ML ~~LOC~~ SOLN
120.0000 mg | Freq: Once | SUBCUTANEOUS | Status: AC
  Administered 2017-02-14: 120 mg via SUBCUTANEOUS

## 2017-02-14 NOTE — Progress Notes (Signed)
Confirmed with Dr. Rogue Bussing:  OK to use the labs from 02/07/17 for Xgeva and Aranesp.

## 2017-02-20 ENCOUNTER — Ambulatory Visit: Payer: Self-pay | Admitting: Radiation Oncology

## 2017-02-28 ENCOUNTER — Inpatient Hospital Stay: Payer: Medicare Other

## 2017-02-28 ENCOUNTER — Other Ambulatory Visit: Payer: Self-pay

## 2017-02-28 VITALS — BP 115/59 | HR 91 | Resp 18

## 2017-02-28 DIAGNOSIS — C7951 Secondary malignant neoplasm of bone: Principal | ICD-10-CM

## 2017-02-28 DIAGNOSIS — C61 Malignant neoplasm of prostate: Secondary | ICD-10-CM

## 2017-02-28 LAB — CBC WITH DIFFERENTIAL/PLATELET
BASOS PCT: 0 %
Basophils Absolute: 0 10*3/uL (ref 0–0.1)
EOS ABS: 0 10*3/uL (ref 0–0.7)
EOS PCT: 0 %
HCT: 28.5 % — ABNORMAL LOW (ref 40.0–52.0)
HEMOGLOBIN: 9.8 g/dL — AB (ref 13.0–18.0)
Lymphocytes Relative: 28 %
Lymphs Abs: 0.7 10*3/uL — ABNORMAL LOW (ref 1.0–3.6)
MCH: 35.1 pg — AB (ref 26.0–34.0)
MCHC: 34.4 g/dL (ref 32.0–36.0)
MCV: 101.9 fL — ABNORMAL HIGH (ref 80.0–100.0)
MONOS PCT: 9 %
Monocytes Absolute: 0.2 10*3/uL (ref 0.2–1.0)
NEUTROS PCT: 63 %
Neutro Abs: 1.5 10*3/uL (ref 1.4–6.5)
PLATELETS: 110 10*3/uL — AB (ref 150–440)
RBC: 2.79 MIL/uL — ABNORMAL LOW (ref 4.40–5.90)
RDW: 15.8 % — AB (ref 11.5–14.5)
WBC: 2.3 10*3/uL — ABNORMAL LOW (ref 3.8–10.6)

## 2017-02-28 MED ORDER — DARBEPOETIN ALFA 300 MCG/0.6ML IJ SOSY
300.0000 ug | PREFILLED_SYRINGE | Freq: Once | INTRAMUSCULAR | Status: AC
  Administered 2017-02-28: 300 ug via SUBCUTANEOUS
  Filled 2017-02-28: qty 0.6

## 2017-03-06 ENCOUNTER — Ambulatory Visit: Payer: Medicare Other | Admitting: Radiation Oncology

## 2017-03-06 ENCOUNTER — Ambulatory Visit: Payer: Self-pay

## 2017-03-07 ENCOUNTER — Inpatient Hospital Stay (HOSPITAL_BASED_OUTPATIENT_CLINIC_OR_DEPARTMENT_OTHER): Payer: Medicare Other | Admitting: Internal Medicine

## 2017-03-07 ENCOUNTER — Inpatient Hospital Stay: Payer: Medicare Other

## 2017-03-07 ENCOUNTER — Inpatient Hospital Stay: Payer: Medicare Other | Attending: Internal Medicine

## 2017-03-07 VITALS — BP 125/68 | HR 112 | Temp 97.8°F | Resp 20 | Ht 72.0 in | Wt 172.2 lb

## 2017-03-07 DIAGNOSIS — D649 Anemia, unspecified: Secondary | ICD-10-CM | POA: Diagnosis not present

## 2017-03-07 DIAGNOSIS — E785 Hyperlipidemia, unspecified: Secondary | ICD-10-CM | POA: Diagnosis not present

## 2017-03-07 DIAGNOSIS — J45909 Unspecified asthma, uncomplicated: Secondary | ICD-10-CM | POA: Diagnosis not present

## 2017-03-07 DIAGNOSIS — R630 Anorexia: Secondary | ICD-10-CM | POA: Diagnosis not present

## 2017-03-07 DIAGNOSIS — Z800 Family history of malignant neoplasm of digestive organs: Secondary | ICD-10-CM | POA: Insufficient documentation

## 2017-03-07 DIAGNOSIS — D696 Thrombocytopenia, unspecified: Secondary | ICD-10-CM | POA: Diagnosis not present

## 2017-03-07 DIAGNOSIS — M869 Osteomyelitis, unspecified: Secondary | ICD-10-CM | POA: Insufficient documentation

## 2017-03-07 DIAGNOSIS — I350 Nonrheumatic aortic (valve) stenosis: Secondary | ICD-10-CM | POA: Diagnosis not present

## 2017-03-07 DIAGNOSIS — I700 Atherosclerosis of aorta: Secondary | ICD-10-CM | POA: Diagnosis not present

## 2017-03-07 DIAGNOSIS — R000 Tachycardia, unspecified: Secondary | ICD-10-CM | POA: Insufficient documentation

## 2017-03-07 DIAGNOSIS — R5383 Other fatigue: Secondary | ICD-10-CM | POA: Insufficient documentation

## 2017-03-07 DIAGNOSIS — Z79899 Other long term (current) drug therapy: Secondary | ICD-10-CM

## 2017-03-07 DIAGNOSIS — C7951 Secondary malignant neoplasm of bone: Principal | ICD-10-CM

## 2017-03-07 DIAGNOSIS — R200 Anesthesia of skin: Secondary | ICD-10-CM | POA: Diagnosis not present

## 2017-03-07 DIAGNOSIS — Z8781 Personal history of (healed) traumatic fracture: Secondary | ICD-10-CM | POA: Insufficient documentation

## 2017-03-07 DIAGNOSIS — C61 Malignant neoplasm of prostate: Secondary | ICD-10-CM

## 2017-03-07 DIAGNOSIS — R011 Cardiac murmur, unspecified: Secondary | ICD-10-CM | POA: Diagnosis not present

## 2017-03-07 DIAGNOSIS — Z8042 Family history of malignant neoplasm of prostate: Secondary | ICD-10-CM | POA: Diagnosis not present

## 2017-03-07 DIAGNOSIS — F17210 Nicotine dependence, cigarettes, uncomplicated: Secondary | ICD-10-CM | POA: Diagnosis not present

## 2017-03-07 LAB — COMPREHENSIVE METABOLIC PANEL
ALBUMIN: 4 g/dL (ref 3.5–5.0)
ALT: 14 U/L — ABNORMAL LOW (ref 17–63)
ANION GAP: 9 (ref 5–15)
AST: 27 U/L (ref 15–41)
Alkaline Phosphatase: 56 U/L (ref 38–126)
BUN: 20 mg/dL (ref 6–20)
CALCIUM: 9.4 mg/dL (ref 8.9–10.3)
CHLORIDE: 101 mmol/L (ref 101–111)
CO2: 24 mmol/L (ref 22–32)
Creatinine, Ser: 0.6 mg/dL — ABNORMAL LOW (ref 0.61–1.24)
GFR calc non Af Amer: >60 mL/min (ref >60)
GLUCOSE: 122 mg/dL — AB (ref 65–99)
POTASSIUM: 4.4 mmol/L (ref 3.5–5.1)
SODIUM: 134 mmol/L — AB (ref 135–145)
Total Bilirubin: 0.6 mg/dL (ref 0.3–1.2)
Total Protein: 6.9 g/dL (ref 6.5–8.1)

## 2017-03-07 LAB — CBC WITH DIFFERENTIAL/PLATELET
BASOS PCT: 1 %
Basophils Absolute: 0 10*3/uL (ref 0–0.1)
EOS ABS: 0 10*3/uL (ref 0–0.7)
EOS PCT: 0 %
HCT: 28.2 % — ABNORMAL LOW (ref 40.0–52.0)
Hemoglobin: 9.6 g/dL — ABNORMAL LOW (ref 13.0–18.0)
LYMPHS ABS: 0.7 10*3/uL — AB (ref 1.0–3.6)
Lymphocytes Relative: 27 %
MCH: 34.5 pg — AB (ref 26.0–34.0)
MCHC: 33.9 g/dL (ref 32.0–36.0)
MCV: 101.7 fL — ABNORMAL HIGH (ref 80.0–100.0)
MONOS PCT: 9 %
Monocytes Absolute: 0.2 10*3/uL (ref 0.2–1.0)
NEUTROS PCT: 63 %
Neutro Abs: 1.8 10*3/uL (ref 1.4–6.5)
PLATELETS: 124 10*3/uL — AB (ref 150–440)
RBC: 2.77 MIL/uL — ABNORMAL LOW (ref 4.40–5.90)
RDW: 15.7 % — AB (ref 11.5–14.5)
WBC: 2.7 10*3/uL — ABNORMAL LOW (ref 3.8–10.6)

## 2017-03-07 LAB — PSA: Prostatic Specific Antigen: 1959 ng/mL — ABNORMAL HIGH (ref 0.00–4.00)

## 2017-03-07 MED ORDER — DARBEPOETIN ALFA 300 MCG/0.6ML IJ SOSY
300.0000 ug | PREFILLED_SYRINGE | Freq: Once | INTRAMUSCULAR | Status: AC
  Administered 2017-03-07: 300 ug via SUBCUTANEOUS
  Filled 2017-03-07: qty 0.6

## 2017-03-07 NOTE — Progress Notes (Signed)
[place] OFFICE PROGRESS NOTE  Patient Care Team: Morayati, Lourdes Sledge, MD as PCP - General (Endocrinology)  Cancer Staging No matching staging information was found for the patient.   Oncology History   1. Carcinoma of prostate diagnosis in 2006.  Had received radiation therapy, external beam to prostate and pelvic lymph nodes.  Had Gleason 9 (4+5.)  PainBaseline PSA was 13. 2. Received Lupron injection after her radiation therapy also.  PSA was going up.  Last year or so received Lupron injection in September but did not have any followup.   3. Recently (this August, 2012) PSA was found to be 17. Patient was started on Lupron from September of 2012. 4. Patient has progressive disease by PSA criteria (October, 2013) 5. Patient was started on Zytiga and prednisone in November of 2013. 6.traumatic hip fracture ((right femur) status post internally fixed ,no evidence of metastatic disease(May of 2014) 7.bone scan in July of 2014 at outside institutions shows metastases to left scapula and left 11th rib/.. 8.progressive On ZYTIGA by PSA criteria.  9[name], September of 2015- Progression- Jan 2016  # AUG 11th 2017- Provenge 2 of planned 3 [sec to logistical issues]  # SEP 2017- START DOCETAXEL weekly; CT C/A/P-Progressive bone mets [compared to 2016 CT]PSA PROGRESSION; NOV 2017BONE SCAN STABLE  # NOV 21st 2017-  CABAZITAXEL q 3 W; 05/02/2016-Added Carboplatin with cycle #3 [given rising PSA]   # June 13th XOFIGO #1  # Anemia on aranesp/IV iron [end of may 2018]  # FEB 2018- GUARDIANT TESTING- AR-7/ no BRCA or targettable mutations.      Prostate cancer metastatic to bone [facility])     INTERVAL HISTORY:  Preston Wilson 81 y.o.  male pleasant patient above history of Metastatic prostate cancer status post multiple lines of therapy Currently on xofigo is here for follow-up. Patient is currently status post 5 cycles of xofigo.  Patient denies any nausea  vomiting diarrhea. Appetite is good. No weight loss. No unusual shortness of breath or chest pain or cough. No swelling in legs. He continues to deny any pain.   REVIEW OF SYSTEMS:  A complete 10 point review of system is done which is negative except mentioned above/history of present illness.   PAST MEDICAL HISTORY :  Past Medical History:  Diagnosis Date  . Asthma   . Heart murmur   . Hyperlipidemia   . Hypocalcemia   . Osteomyelitis ([place])   . Prostate cancer ([place])   . SOB (shortness of breath)     PAST SURGICAL HISTORY :   Past Surgical History:  Procedure Laterality Date  . EXCISIONAL HEMORRHOIDECTOMY    . TONSILLECTOMY      FAMILY HISTORY :   Family History  Problem Relation Age of Onset  . Colon cancer Father        deceased  . Prostate cancer Brother        deceased  . Other Child        neuroblastoma    SOCIAL HISTORY:   Social History   Tobacco Use  . Smoking status: Current Every Day Smoker    Packs/day: 0.50    Types: Cigarettes  . Smokeless tobacco: Never Used  Substance Use Topics  . Alcohol use: No    Alcohol/week: 0.0 oz  . Drug use: No    ALLERGIES:  has No Known Allergies.  MEDICATIONS:  Current Outpatient Medications  Medication Sig Dispense Refill  . ADVAIR DISKUS 100-50 MCG/DOSE AEPB inhale 1  dose by mouth twice a day  0  . calcium-vitamin D (OSCAL WITH D) 500-200 MG-UNIT per tablet Take 1 tablet by mouth daily with breakfast.     . ferrous sulfate 325 (65 FE) MG tablet Take 325 mg by mouth daily with breakfast.    . Leuprolide Acetate (LUPRON IJ) Inject 1 mL as directed every 3 (three) months.    . Multiple Vitamin (MULTIVITAMIN) tablet Take 1 tablet by mouth daily.    . predniSONE (DELTASONE) 5 MG tablet Take 1 tablet (5 mg total) by mouth 2 (two) times daily with a meal. (Patient taking differently: Take 5 mg by mouth daily. ) 60 tablet 3  . Vitamin D, Ergocalciferol, (DRISDOL) 50000 UNITS CAPS capsule Take 50,000 Units by mouth once  a week. Fridays  0  . ondansetron (ZOFRAN) 8 MG tablet 1 pill every 8 hours as needed for nausea/vomitting (Patient not taking: Reported on 12/27/2016) 40 tablet 1   No current facility-administered medications for this visit.     PHYSICAL EXAMINATION: ECOG PERFORMANCE STATUS: 0 - Asymptomatic  BP 125/68 (BP Location: Right Arm, Patient Position: Sitting)   Pulse (!) 112   Temp 97.8 F (36.6 C) (Tympanic)   Resp 20   Ht 6' (1.829 m)   Wt 172 lb 3.2 oz (78.1 kg)   BMI 23.35 kg/m   Filed Weights   03/07/17 1138  Weight: 172 lb 3.2 oz (78.1 kg)    GENERAL: Well-nourished well-developed; Alert, no distress and comfortable.   Accompanied by family. EYES: no pallor or icterus OROPHARYNX: no thrush or ulceration; good dentition  NECK: supple, no masses felt LYMPH:  no palpable lymphadenopathy in the cervical, axillary or inguinal regions LUNGS: clear to auscultation and  No wheeze or crackles HEART/CVS: regular rate & rhythm and positive for murmur; No lower extremity edema ABDOMEN:abdomen soft, non-tender and normal bowel sounds Musculoskeletal:no cyanosis of digits and no clubbing  PSYCH: alert & oriented x 3 with fluent speech NEURO: no focal motor/sensory deficits SKIN:  no rashes or significant lesions  LABORATORY DATA:  I have reviewed the data as listed    Component Value Date/Time   NA 134 (L) 03/07/2017 1118   NA 138 08/25/2014 1442   K 4.4 03/07/2017 1118   K 4.1 08/25/2014 1442   CL 101 03/07/2017 1118   CL 106 08/25/2014 1442   CO2 24 03/07/2017 1118   CO2 25 08/25/2014 1442   GLUCOSE 122 (H) 03/07/2017 1118   GLUCOSE 138 (H) 08/25/2014 1442   BUN 20 03/07/2017 1118   BUN 19 08/25/2014 1442   CREATININE 0.60 (L) 03/07/2017 1118   CREATININE 0.53 (L) 08/25/2014 1442   CALCIUM 9.4 03/07/2017 1118   CALCIUM 9.0 08/25/2014 1442   PROT 6.9 03/07/2017 1118   PROT 6.8 08/25/2014 1442   ALBUMIN 4.0 03/07/2017 1118   ALBUMIN 3.9 08/25/2014 1442   AST 27  03/07/2017 1118   AST 18 08/25/2014 1442   ALT 14 (L) 03/07/2017 1118   ALT 13 (L) 08/25/2014 1442   ALKPHOS 56 03/07/2017 1118   ALKPHOS 73 08/25/2014 1442   BILITOT 0.6 03/07/2017 1118   BILITOT 0.7 08/25/2014 1442   GFRNONAA >60 03/07/2017 1118   GFRNONAA >60 08/25/2014 1442   GFRAA >60 03/07/2017 1118   GFRAA >60 08/25/2014 1442    No results found for: SPEP, UPEP  Lab Results  Component Value Date   WBC 2.7 (L) 03/07/2017   NEUTROABS 1.8 03/07/2017   HGB 9.6 (L)  03/07/2017   HCT 28.2 (L) 03/07/2017   MCV 101.7 (H) 03/07/2017   PLT 124 (L) 03/07/2017      Chemistry      Component Value Date/Time   NA 134 (L) 03/07/2017 1118   NA 138 08/25/2014 1442   K 4.4 03/07/2017 1118   K 4.1 08/25/2014 1442   CL 101 03/07/2017 1118   CL 106 08/25/2014 1442   CO2 24 03/07/2017 1118   CO2 25 08/25/2014 1442   BUN 20 03/07/2017 1118   BUN 19 08/25/2014 1442   CREATININE 0.60 (L) 03/07/2017 1118   CREATININE 0.53 (L) 08/25/2014 1442      Component Value Date/Time   CALCIUM 9.4 03/07/2017 1118   CALCIUM 9.0 08/25/2014 1442   ALKPHOS 56 03/07/2017 1118   ALKPHOS 73 08/25/2014 1442   AST 27 03/07/2017 1118   AST 18 08/25/2014 1442   ALT 14 (L) 03/07/2017 1118   ALT 13 (L) 08/25/2014 1442   BILITOT 0.6 03/07/2017 1118   BILITOT 0.7 08/25/2014 1442       Results for Preston Wilson, Preston Wilson (MRN [id number]) as of 02/07/2017 08:50  Ref. Range 09/04/2016 10:30 10/04/2016 13:10 11/01/2016 07:57 11/29/2016 08:12 12/27/2016 10:04  PSA Latest Ref Range: 0.00 - 4.00 ng/mL 2,814.00 (H) 2,151.00 (H)     Prostatic Specific Antigen Latest Ref Range: 0.00 - 4.00 ng/mL   1,998.00 (H) 1,911.00 (H) 2,109.00 (H)   IMPRESSION: 1. Progressive sclerosis of previously existing widespread osseous metastatic disease. Increase in sclerosis in this fashion is nonspecific but certainly can be seen in setting of response to therapy. 2. Questionable fracture of the left sacral ala,  nondisplaced, correlate with any tenderness along the left posterior sacrum. 3. No adenopathy or prostate gland enlargement. 4. Contrast in the distal esophagus potentially from dysmotility or reflux. 5. Lumbar spondylosis and degenerative disc disease with impingement at L5-S 1. 6.  Aortic Atherosclerosis (ICD10-I70.0).   Electronically Signed   By: Van Clines M.D.   On: 01/29/2017 12:50 --------------------------------------------------------------------     IMPRESSION: Persistent widespread bony metastatic disease without appreciable change from prior studies. Renal uptake is noted bilaterally.   Electronically Signed   By: Lowella Grip III M.D.   On: 01/29/2017 14:40  --------------------------------------------------------------------    RADIOGRAPHIC STUDIES: I have personally reviewed the radiological images as listed and agreed with the findings in the report. No results found.   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 01/29/2017 CT scan/bone scan shows stable/more sclerotic bone lesions. No evidence of any visceral metastases. Bone scan- STABLE.  PSA- improving to 1346/ [from ~2000]  # currently on xofigo ; due for cycle #6 Nov 7th. Discussed with radiation oncology.   # anemia sec to chemo- on aranesp 300 mcg q 2 w; also status post Venofer. Today hemoglobin is 9.6.   # thrombocytopenia- 124- sec to xofigo. Monitor for now.   # Proceed with X-geva today [8/23; plan next week-2nd week of october]; Last Lupron q 28M- [last 09/06; again due in dec 2018];   #  [facility] script in 2 weeks- lab; follow up with me in 1 week of dec/labs-cbc/cmp/psa/aranesp/lupron.   Cc; Dr.McNamara.   Orders Placed This Encounter  Procedures  . CBC with Differential/Platelet    Standing Status:   Future    Standing Expiration Date:   03/07/2018  .  Comprehensive metabolic panel    Standing Status:   Future  Standing Expiration Date:   03/07/2018  . PSA    Standing Status:   Future    Standing Expiration Date:   03/07/2018     Cammie Sickle, MD 03/10/2017 11:55 AM

## 2017-03-07 NOTE — Assessment & Plan Note (Addendum)
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 01/29/2017 CT scan/bone scan shows stable/more sclerotic bone lesions. No evidence of any visceral metastases. Bone scan- STABLE.  PSA- improving to 1346/ [from ~2000]  # currently on xofigo ; due for cycle #6 Nov 7th. Discussed with radiation oncology.   # anemia sec to chemo- on aranesp 300 mcg q 2 w; also status post Venofer. Today hemoglobin is 9.6.   # thrombocytopenia- 124- sec to xofigo. Monitor for now.   # Proceed with X-geva today [8/23; plan next week-2nd week of october]; Last Lupron q 37M- [last 09/06; again due in dec 2018];   #  [facility] script in 2 weeks- lab; follow up with me in 1 week of dec/labs-cbc/cmp/psa/aranesp/lupron.   Cc; Dr.McNamara.

## 2017-03-13 ENCOUNTER — Ambulatory Visit: Payer: Medicare Other | Admitting: Radiation Oncology

## 2017-03-13 ENCOUNTER — Encounter
Admission: RE | Admit: 2017-03-13 | Discharge: 2017-03-13 | Disposition: A | Discharge status: Home / Self Care | Payer: Medicare Other | Source: Ambulatory Visit | Attending: Radiation Oncology | Admitting: Radiation Oncology

## 2017-03-13 DIAGNOSIS — C7951 Secondary malignant neoplasm of bone: Secondary | ICD-10-CM | POA: Insufficient documentation

## 2017-03-13 DIAGNOSIS — C61 Malignant neoplasm of prostate: Secondary | ICD-10-CM | POA: Diagnosis present

## 2017-03-13 MED ORDER — RADIUM RA 223 DICHLORIDE 30 MCCI/ML IV SOLN
121.0000 | Freq: Once | INTRAVENOUS | Status: AC
  Administered 2017-03-13: 121.5 via INTRAVENOUS

## 2017-03-14 ENCOUNTER — Other Ambulatory Visit: Payer: Self-pay | Admitting: *Deleted

## 2017-03-14 ENCOUNTER — Inpatient Hospital Stay (HOSPITAL_BASED_OUTPATIENT_CLINIC_OR_DEPARTMENT_OTHER): Payer: Medicare Other | Admitting: Nurse Practitioner

## 2017-03-14 ENCOUNTER — Other Ambulatory Visit: Payer: Self-pay

## 2017-03-14 ENCOUNTER — Inpatient Hospital Stay: Payer: Medicare Other

## 2017-03-14 ENCOUNTER — Encounter: Payer: Self-pay | Admitting: Oncology

## 2017-03-14 VITALS — BP 140/70 | HR 128 | Temp 98.1°F | Resp 20 | Ht 72.0 in | Wt 172.0 lb

## 2017-03-14 DIAGNOSIS — D649 Anemia, unspecified: Secondary | ICD-10-CM

## 2017-03-14 DIAGNOSIS — D696 Thrombocytopenia, unspecified: Secondary | ICD-10-CM

## 2017-03-14 DIAGNOSIS — Z79899 Other long term (current) drug therapy: Secondary | ICD-10-CM

## 2017-03-14 DIAGNOSIS — J45909 Unspecified asthma, uncomplicated: Secondary | ICD-10-CM

## 2017-03-14 DIAGNOSIS — R5383 Other fatigue: Secondary | ICD-10-CM | POA: Diagnosis not present

## 2017-03-14 DIAGNOSIS — C7951 Secondary malignant neoplasm of bone: Secondary | ICD-10-CM | POA: Diagnosis not present

## 2017-03-14 DIAGNOSIS — F17210 Nicotine dependence, cigarettes, uncomplicated: Secondary | ICD-10-CM

## 2017-03-14 DIAGNOSIS — C61 Malignant neoplasm of prostate: Secondary | ICD-10-CM

## 2017-03-14 DIAGNOSIS — Z800 Family history of malignant neoplasm of digestive organs: Secondary | ICD-10-CM

## 2017-03-14 DIAGNOSIS — R200 Anesthesia of skin: Secondary | ICD-10-CM | POA: Diagnosis not present

## 2017-03-14 DIAGNOSIS — R011 Cardiac murmur, unspecified: Secondary | ICD-10-CM

## 2017-03-14 DIAGNOSIS — E785 Hyperlipidemia, unspecified: Secondary | ICD-10-CM

## 2017-03-14 DIAGNOSIS — R000 Tachycardia, unspecified: Secondary | ICD-10-CM

## 2017-03-14 DIAGNOSIS — I350 Nonrheumatic aortic (valve) stenosis: Secondary | ICD-10-CM | POA: Diagnosis not present

## 2017-03-14 DIAGNOSIS — Z8042 Family history of malignant neoplasm of prostate: Secondary | ICD-10-CM

## 2017-03-14 DIAGNOSIS — R630 Anorexia: Secondary | ICD-10-CM | POA: Diagnosis not present

## 2017-03-14 DIAGNOSIS — M869 Osteomyelitis, unspecified: Secondary | ICD-10-CM

## 2017-03-14 DIAGNOSIS — I700 Atherosclerosis of aorta: Secondary | ICD-10-CM

## 2017-03-14 DIAGNOSIS — Z8781 Personal history of (healed) traumatic fracture: Secondary | ICD-10-CM

## 2017-03-14 LAB — CBC WITH DIFFERENTIAL/PLATELET
BASOS PCT: 0 %
Basophils Absolute: 0 10*3/uL (ref 0–0.1)
EOS ABS: 0 10*3/uL (ref 0–0.7)
Eosinophils Relative: 0 %
HCT: 20.4 % — ABNORMAL LOW (ref 40.0–52.0)
Hemoglobin: 6.9 g/dL — ABNORMAL LOW (ref 13.0–18.0)
Lymphocytes Relative: 28 %
Lymphs Abs: 0.9 10*3/uL — ABNORMAL LOW (ref 1.0–3.6)
MCH: 34.4 pg — ABNORMAL HIGH (ref 26.0–34.0)
MCHC: 33.6 g/dL (ref 32.0–36.0)
MCV: 102.4 fL — ABNORMAL HIGH (ref 80.0–100.0)
MONO ABS: 0.3 10*3/uL (ref 0.2–1.0)
MONOS PCT: 10 %
NEUTROS PCT: 62 %
Neutro Abs: 1.9 10*3/uL (ref 1.4–6.5)
PLATELETS: 125 10*3/uL — AB (ref 150–440)
RBC: 1.99 MIL/uL — ABNORMAL LOW (ref 4.40–5.90)
RDW: 15.7 % — AB (ref 11.5–14.5)
WBC: 3.1 10*3/uL — ABNORMAL LOW (ref 3.8–10.6)

## 2017-03-14 LAB — RETICULOCYTES
RBC.: 2.04 MIL/uL — AB (ref 4.40–5.90)
RETIC CT PCT: 3.2 % — AB (ref 0.4–3.1)
Retic Count, Absolute: 65.3 10*3/uL (ref 19.0–183.0)

## 2017-03-14 LAB — COMPREHENSIVE METABOLIC PANEL
ALT: 12 U/L — AB (ref 17–63)
AST: 28 U/L (ref 15–41)
Albumin: 3.8 g/dL (ref 3.5–5.0)
Alkaline Phosphatase: 53 U/L (ref 38–126)
Anion gap: 9 (ref 5–15)
BUN: 32 mg/dL — ABNORMAL HIGH (ref 6–20)
CHLORIDE: 99 mmol/L — AB (ref 101–111)
CO2: 24 mmol/L (ref 22–32)
CREATININE: 0.54 mg/dL — AB (ref 0.61–1.24)
Calcium: 9.3 mg/dL (ref 8.9–10.3)
GFR calc non Af Amer: >60 mL/min (ref >60)
Glucose, Bld: 113 mg/dL — ABNORMAL HIGH (ref 65–99)
POTASSIUM: 4.7 mmol/L (ref 3.5–5.1)
SODIUM: 132 mmol/L — AB (ref 135–145)
Total Bilirubin: 0.5 mg/dL (ref 0.3–1.2)
Total Protein: 6.7 g/dL (ref 6.5–8.1)

## 2017-03-14 LAB — FOLATE: FOLATE: 37 ng/mL (ref >5.9)

## 2017-03-14 LAB — LACTATE DEHYDROGENASE: LDH: 153 U/L (ref 98–192)

## 2017-03-14 LAB — VITAMIN B12: VITAMIN B 12: 278 pg/mL (ref 180–914)

## 2017-03-14 LAB — SAMPLE TO BLOOD BANK

## 2017-03-14 LAB — IRON AND TIBC
Iron: 54 ug/dL (ref 45–182)
Saturation Ratios: 20 % (ref 17.9–39.5)
TIBC: 272 ug/dL (ref 250–450)
UIBC: 218 ug/dL

## 2017-03-14 LAB — FERRITIN: Ferritin: 493 ng/mL — ABNORMAL HIGH (ref 24–336)

## 2017-03-14 LAB — PREPARE RBC (CROSSMATCH)

## 2017-03-14 MED ORDER — SODIUM CHLORIDE 0.9 % IV SOLN
Freq: Once | INTRAVENOUS | Status: AC
  Administered 2017-03-14: 13:00:00 via INTRAVENOUS
  Filled 2017-03-14: qty 1000

## 2017-03-14 NOTE — Progress Notes (Signed)
Patient presents to clinic in an acute walk in to cancer center. Patient c/o numbness in right chin/lip since yesterday. Pt reported these symptoms to his wife this morning. Pt also c/o extreme fatigue with ADLs.  He reports an episode of chest pain "pressure that radiated to his mid back last week (11/1) when he was driving to the beach. He used a heating pad on his back upon arrival to his beach home. The back/chest pain resolved. "I thought I may have just sat too long in the car and since the pain relieved, I didn't do anything else for my chest pain." pt denies any numbness or paralysis in any other areas of his body. No blurred vision or headaches. Apical pulse irregular 128 with heart murmur.

## 2017-03-14 NOTE — Progress Notes (Addendum)
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 Rogue Bussing) 03/07/17 for castrate resistant prostate cancer w/ widespread bony metastatic disease. Last bone scan (01/29/2017) did not show appreciable change from prior studies. Some uptake bilaterally in kidneys. Currently on Xofigo s/p 6 cycles (03/13/17) w/ Lupron (01/10/17)/Xgeva (02/14/17) and Aranesp (03/07/17). PSA- again trending up 1959 - (2886-04/2016 & 2814-09/2016). Sees Dr. Irene Shipper at [facility] clinic. Last visit 01/28/17 w/ plan to rtc mid-December with re-staging scans done prior.    Interval history- patient presents to clinic today for complaints of worsening fatigue,racing heart, and a numb chin. Patient reports that he received his Xofigo injection yesterday and since that time he has noticed worsening fatigue. He reported to his wife that he just did not feel like himself.He noticed that his chin had become numb for the past 48 hours. When it did not improve he became concerned for stroke. He reports that the numbness originates at the left corner of his mouth and extends to his left chin.   His wife is concerned that he has persistently poor appetite and reports that patient suffered left upper back pain approximately 1 week ago which they related to all day driving. Pain resolved with rest, heat, aspirin, and tylenol. Patient sees Dr. Nehemiah Massed at [facility] (last visit 10/09/16).   ECOG FS:1 - Symptomatic but completely ambulatory  Review of systems- Review of Systems  Constitutional: Positive for  malaise/fatigue. Negative for chills and fever.  HENT: Positive for hearing loss. Negative for congestion, ear pain, sinus pain, sore throat and tinnitus.   Eyes: Negative for blurred vision, double vision and photophobia.  Respiratory: Positive for shortness of breath. Negative for cough and wheezing.   Cardiovascular: Negative for chest pain, palpitations, claudication and leg swelling.  Gastrointestinal: Negative for abdominal pain, blood in stool, constipation, diarrhea, melena, nausea and vomiting.  Genitourinary: Negative for dysuria, flank pain and hematuria.  Musculoskeletal: Negative for falls, joint pain and myalgias.  Skin: Negative for rash.  Neurological: Positive for dizziness and weakness. Negative for tingling, speech change, focal weakness, loss of consciousness and headaches.  Endo/Heme/Allergies: Does not bruise/bleed easily.  Psychiatric/Behavioral: Negative for memory loss. The patient is nervous/anxious.      No Known Allergies   Past Medical History:  Diagnosis Date  . Asthma   . Heart murmur   . Hyperlipidemia   . Hypocalcemia   . Osteomyelitis ([place])   . Prostate cancer ([place])   . SOB (shortness of breath)      Past Surgical History:  Procedure Laterality Date  . EXCISIONAL HEMORRHOIDECTOMY    . TONSILLECTOMY      Social History   Socioeconomic History  . Marital status: Married    Spouse name: Not on file  . Number of children: Not on file  . Years of education: Not on file  . Highest education level: Not on file  Social Needs  . Financial resource strain: Not on file  . Food insecurity - worry: Not on file  . Food insecurity - inability: Not on file  . Transportation needs - medical: Not on file  .  Transportation needs - non-medical: Not on file  Occupational History  . Not on file  Tobacco Use  . Smoking status: Current Every Day Smoker    Packs/day: 0.50    Types: Cigarettes  . Smokeless tobacco: Never Used  Substance and Sexual  Activity  . Alcohol use: No    Alcohol/week: 0.0 oz  . Drug use: No  . Sexual activity: Not on file  Other Topics Concern  . Not on file  Social History Narrative  . Not on file    Family History  Problem Relation Age of Onset  . Colon cancer Father        deceased  . Prostate cancer Brother        deceased  . Other Child        neuroblastoma     Current Outpatient Medications:  .  ADVAIR DISKUS 100-50 MCG/DOSE AEPB, inhale 1 dose by mouth twice a day, Disp: , Rfl: 0 .  calcium-vitamin D (OSCAL WITH D) 500-200 MG-UNIT per tablet, Take 1 tablet by mouth daily with breakfast. , Disp: , Rfl:  .  ferrous sulfate 325 (65 FE) MG tablet, Take 325 mg by mouth daily with breakfast., Disp: , Rfl:  .  Leuprolide Acetate (LUPRON IJ), Inject 1 mL as directed every 3 (three) months., Disp: , Rfl:  .  Multiple Vitamin (MULTIVITAMIN) tablet, Take 1 tablet by mouth daily., Disp: , Rfl:  .  predniSONE (DELTASONE) 5 MG tablet, Take 1 tablet (5 mg total) by mouth 2 (two) times daily with a meal. (Patient taking differently: Take 5 mg by mouth daily. ), Disp: 60 tablet, Rfl: 3 .  Vitamin D, Ergocalciferol, (DRISDOL) 50000 UNITS CAPS capsule, Take 50,000 Units by mouth once a week. Fridays, Disp: , Rfl: 0 .  ondansetron (ZOFRAN) 8 MG tablet, 1 pill every 8 hours as needed for nausea/vomitting (Patient not taking: Reported on 12/27/2016), Disp: 40 tablet, Rfl: 1  Physical exam:  Vitals:   03/14/17 1126 03/14/17 1135  BP: 140/70   Pulse: (!) 128   Resp: 20   Temp: 98.1 F (36.7 C)   TempSrc: Oral   SpO2:  98%  Weight: 172 lb (78 kg)   Height: 6' (1.829 m)    Physical Exam  Constitutional: He is oriented to person, place, and time. He appears malnourished.  Elderly, frail appearing man. Ambulatory. Accompanied by wife  HENT:  Head: Normocephalic.  Right Ear: External ear normal. No decreased hearing is noted.  Left Ear: External ear normal. No decreased hearing is noted.  Hearing loss  chronic & unchanged. Poor dentition  Eyes: Right eye visual fields normal and left eye visual fields normal. Conjunctivae and EOM are normal. Pupils are equal, round, and reactive to light.  Neck: Normal range of motion. Neck supple. No JVD present.  Cardiovascular: Tachycardia present.  Murmur heard.  Systolic murmur is present with a grade of 3/6. Pulses:      Carotid pulses are on the left side with bruit. No palpable thrill. Heard loudest at aortic area  Pulmonary/Chest: Effort normal and breath sounds normal. No respiratory distress. He has no wheezes.  Abdominal: Soft. Bowel sounds are normal. He exhibits no distension. There is no tenderness.  Musculoskeletal: Normal range of motion. He exhibits no deformity.  Lymphadenopathy:    He has no cervical adenopathy.  Neurological: He is alert and oriented to person, place, and time. He has normal strength. He is not disoriented. He displays no weakness and facial symmetry.  No cranial nerve deficit. He shows no pronator drift. Gait normal. Coordination and gait normal. GCS score is 15.  Skin: Skin is dry and intact. There is pallor.  Psychiatric: Memory, affect and judgment normal.     CMP Latest Ref Rng & Units 03/14/2017  Glucose 65 - 99 mg/dL 113(H)  BUN 6 - 20 mg/dL 32(H)  Creatinine 0.61 - 1.24 mg/dL 0.54(L)  Sodium 135 - 145 mmol/L 132(L)  Potassium 3.5 - 5.1 mmol/L 4.7  Chloride 101 - 111 mmol/L 99(L)  CO2 22 - 32 mmol/L 24  Calcium 8.9 - 10.3 mg/dL 9.3  Total Protein 6.5 - 8.1 g/dL 6.7  Total Bilirubin 0.3 - 1.2 mg/dL 0.5  Alkaline Phos 38 - 126 U/L 53  AST 15 - 41 U/L 28  ALT 17 - 63 U/L 12(L)   CBC Latest Ref Rng & Units 03/14/2017  WBC 3.8 - 10.6 K/uL 3.1(L)  Hemoglobin 13.0 - 18.0 g/dL 6.9(L)  Hematocrit 40.0 - 52.0 % 20.4(L)  Platelets 150 - 440 K/uL 125(L)   No images are attached to the encounter.  Nm Xofigo Injection  Result Date: 03/13/2017  Trudi Ida was injected intravenously in Nuclear Medicine under the  supervision of the attending radiologist     Assessment and plan- Patient is a 81 y.o. male presents to Symptom Management clinic for acute visit with complaints of worsening fatigue, rapid heartbeat, and new chin numbness.    Visit Diagnosis 1. Prostate cancer metastatic to bone [facility])    Initial Evaluation 1. Chin Numbness- Risk factors for stroke but neurologically intact. No deficits on exam. In setting of prostate cancer w/ mets will do MRI of head to further evaluate.  2. Tachycardia - 12 lead ekg, cbc, cmp 3. Fatigue- Hx of anemia. Previously iron deficiency. Patient is a smoker. Clinically, patient appears extremely pale. Tachycardic. SpO2 98%. Possible dehydration. In setting of cardiac history, will gently hydrate 1L NaCl over 2 hours. Will check iron studies, b12, folate. Will have lab hold tube for blood bank in event pt requires transfusion.  4. Aortic Stenosis- patient states history of heart murmur. Will review notes from cardiology for comparison. In interim, will perform 12-lead EKG. Could consider ultrasound of carotids but suspect this is chronic and unchanged.   Final Evaluation 1. Prostate cancer with metastasis to bone- concern that he chin numbness may be related to metastasis to brain or irritation/compression of trigeminal nerve. Will send patient for MRI. In the absence of acute neurological deficits, less concerning for stroke. Educated patient and wife on signs and symptoms of stroke and given ED precautions.   2. Anemia- etiology unclear. Hmg 6.9 (9.6-03/07/17). MCV 102.4, MCH 34.4, RDW 15.7, Plt 125. Ferritin 493, saturation 20, B12-278. Will add on haptoglobin,LDH, and reticulocyte count to further evaluate.   Discussed expected benefits of transfusion and disclosed risks of transfusion, including but not limited to, transmission of infection or disease, acute or delayed hemolytic reactions, febrile reactions, allergic or anaphylactic reactions, transfusion associated  lung injuries, circulatory overload, metabolic toxicity, and/or iron overload. Also, discussed alternatives to transfusion and use of any premedications. Patient verbalizes consent and wishes to proceed.   Will plan for patient to receive 2 units of PRBCs and recheck CBC next week.   3. Tachycardia- improved with fluid hydration. appears to be somewhat baseline for patient. May be worsened due to anxiety and anemia. Continue to monitor.   4. Aortic Stenosis- clinical exam consistent with notes from cardiology. No acute issues with chest pain.  Suspect shortness of breath secondary to anemia. EKG markedly similar to that from April 2018 cardiology visit. No additional cardiac evaluation at this time. Management deferred to cardiology.  5. Poor appetite- weight stable. Could consider having patient follow back up with dietitian at future visit.   Patient spends the month of November at [facility] and is eager to return. Discussed ER precautions and advised him to call the triage line if questions. Patient is able to receive lab work at [facility] in [place].   Encouraged patient to have CBC performed in 1 week and return to clinic for reevaluation at that time.   Patient expressed understanding and was in agreement with this plan. He also understands that He can call clinic at any time with any questions, concerns, or complaints.    Beckey Rutter, DNP AGNP-C [facility] at [facility] Pager- [place]- [phone] 03/15/2017 5:52 PM

## 2017-03-14 NOTE — Progress Notes (Signed)
Per Ander Purpura, NP, added ldh/retic and released blood orders in prep for tom.  Spoke with Maudie Mercury, RN- will add haptoglobin and attempt blood draw via iv line for this lab test s/p IVF. If haptoglobin can not be added, then we can add this lab test to labs tom.

## 2017-03-15 ENCOUNTER — Ambulatory Visit: Payer: Medicare Other

## 2017-03-15 ENCOUNTER — Inpatient Hospital Stay: Payer: Medicare Other

## 2017-03-15 ENCOUNTER — Encounter: Payer: Self-pay | Admitting: Nurse Practitioner

## 2017-03-15 DIAGNOSIS — C7951 Secondary malignant neoplasm of bone: Principal | ICD-10-CM

## 2017-03-15 DIAGNOSIS — C61 Malignant neoplasm of prostate: Secondary | ICD-10-CM | POA: Diagnosis not present

## 2017-03-15 LAB — HAPTOGLOBIN: HAPTOGLOBIN: 206 mg/dL — AB (ref 34–200)

## 2017-03-15 MED ORDER — ACETAMINOPHEN 325 MG PO TABS
ORAL_TABLET | ORAL | Status: AC
  Filled 2017-03-15: qty 2

## 2017-03-15 MED ORDER — SODIUM CHLORIDE 0.9 % IV SOLN
Freq: Once | INTRAVENOUS | Status: AC
  Administered 2017-03-15: 09:00:00 via INTRAVENOUS
  Filled 2017-03-15: qty 1000

## 2017-03-15 MED ORDER — DIPHENHYDRAMINE HCL 25 MG PO CAPS
25.0000 mg | ORAL_CAPSULE | Freq: Once | ORAL | Status: AC
  Administered 2017-03-15: 25 mg via ORAL

## 2017-03-15 MED ORDER — ACETAMINOPHEN 325 MG PO TABS
650.0000 mg | ORAL_TABLET | Freq: Once | ORAL | Status: AC
  Administered 2017-03-15: 650 mg via ORAL

## 2017-03-15 MED ORDER — DIPHENHYDRAMINE HCL 25 MG PO CAPS
ORAL_CAPSULE | ORAL | Status: AC
  Filled 2017-03-15: qty 1

## 2017-03-16 LAB — TYPE AND SCREEN
ABO/RH(D): O POS
Antibody Screen: NEGATIVE
UNIT DIVISION: 0
Unit division: 0

## 2017-03-16 LAB — BPAM RBC
BLOOD PRODUCT EXPIRATION DATE: 201811152359
Blood Product Expiration Date: 201811152359
ISSUE DATE / TIME: 201811091200
ISSUE DATE / TIME: 201811091011
UNIT TYPE AND RH: 5100
Unit Type and Rh: 5100

## 2017-03-22 ENCOUNTER — Inpatient Hospital Stay: Payer: Medicare Other

## 2017-03-22 ENCOUNTER — Ambulatory Visit
Admission: RE | Admit: 2017-03-22 | Discharge: 2017-03-22 | Disposition: A | Discharge status: Home / Self Care | Payer: Medicare Other | Source: Ambulatory Visit | Attending: Nurse Practitioner | Admitting: Nurse Practitioner

## 2017-03-22 ENCOUNTER — Encounter: Payer: Self-pay | Admitting: Nurse Practitioner

## 2017-03-22 ENCOUNTER — Other Ambulatory Visit: Payer: Self-pay | Admitting: *Deleted

## 2017-03-22 ENCOUNTER — Inpatient Hospital Stay (HOSPITAL_BASED_OUTPATIENT_CLINIC_OR_DEPARTMENT_OTHER): Payer: Medicare Other | Admitting: Internal Medicine

## 2017-03-22 ENCOUNTER — Other Ambulatory Visit: Payer: Self-pay

## 2017-03-22 ENCOUNTER — Ambulatory Visit
Admission: RE | Admit: 2017-03-22 | Discharge: 2017-03-22 | Disposition: A | Discharge status: Home / Self Care | Payer: Medicare Other | Source: Ambulatory Visit | Attending: Internal Medicine | Admitting: Internal Medicine

## 2017-03-22 VITALS — BP 105/62 | HR 118 | Temp 98.7°F | Resp 18 | Ht 72.0 in | Wt 173.0 lb

## 2017-03-22 DIAGNOSIS — R011 Cardiac murmur, unspecified: Secondary | ICD-10-CM

## 2017-03-22 DIAGNOSIS — C7951 Secondary malignant neoplasm of bone: Secondary | ICD-10-CM

## 2017-03-22 DIAGNOSIS — Z8781 Personal history of (healed) traumatic fracture: Secondary | ICD-10-CM

## 2017-03-22 DIAGNOSIS — R200 Anesthesia of skin: Secondary | ICD-10-CM | POA: Diagnosis not present

## 2017-03-22 DIAGNOSIS — R630 Anorexia: Secondary | ICD-10-CM

## 2017-03-22 DIAGNOSIS — R930 Abnormal findings on diagnostic imaging of skull and head, not elsewhere classified: Secondary | ICD-10-CM | POA: Diagnosis not present

## 2017-03-22 DIAGNOSIS — D649 Anemia, unspecified: Secondary | ICD-10-CM | POA: Diagnosis not present

## 2017-03-22 DIAGNOSIS — M869 Osteomyelitis, unspecified: Secondary | ICD-10-CM

## 2017-03-22 DIAGNOSIS — F17210 Nicotine dependence, cigarettes, uncomplicated: Secondary | ICD-10-CM

## 2017-03-22 DIAGNOSIS — Z800 Family history of malignant neoplasm of digestive organs: Secondary | ICD-10-CM

## 2017-03-22 DIAGNOSIS — I700 Atherosclerosis of aorta: Secondary | ICD-10-CM

## 2017-03-22 DIAGNOSIS — I350 Nonrheumatic aortic (valve) stenosis: Secondary | ICD-10-CM

## 2017-03-22 DIAGNOSIS — Z8042 Family history of malignant neoplasm of prostate: Secondary | ICD-10-CM

## 2017-03-22 DIAGNOSIS — C61 Malignant neoplasm of prostate: Secondary | ICD-10-CM | POA: Diagnosis not present

## 2017-03-22 DIAGNOSIS — D696 Thrombocytopenia, unspecified: Secondary | ICD-10-CM

## 2017-03-22 DIAGNOSIS — Z79899 Other long term (current) drug therapy: Secondary | ICD-10-CM

## 2017-03-22 DIAGNOSIS — J45909 Unspecified asthma, uncomplicated: Secondary | ICD-10-CM | POA: Diagnosis not present

## 2017-03-22 DIAGNOSIS — R000 Tachycardia, unspecified: Secondary | ICD-10-CM | POA: Diagnosis not present

## 2017-03-22 DIAGNOSIS — R5383 Other fatigue: Secondary | ICD-10-CM

## 2017-03-22 DIAGNOSIS — E785 Hyperlipidemia, unspecified: Secondary | ICD-10-CM

## 2017-03-22 LAB — CBC WITH DIFFERENTIAL/PLATELET
BASOS ABS: 0 10*3/uL (ref 0–0.1)
Basophils Relative: 0 %
EOS PCT: 0 %
Eosinophils Absolute: 0 10*3/uL (ref 0–0.7)
HCT: 20.6 % — ABNORMAL LOW (ref 40.0–52.0)
HEMOGLOBIN: 6.8 g/dL — AB (ref 13.0–18.0)
LYMPHS ABS: 0.6 10*3/uL — AB (ref 1.0–3.6)
LYMPHS PCT: 24 %
MCH: 33.5 pg (ref 26.0–34.0)
MCHC: 32.8 g/dL (ref 32.0–36.0)
MCV: 101.9 fL — ABNORMAL HIGH (ref 80.0–100.0)
MONO ABS: 0.2 10*3/uL (ref 0.2–1.0)
MONOS PCT: 9 %
NEUTROS ABS: 1.7 10*3/uL (ref 1.4–6.5)
Neutrophils Relative %: 67 %
Platelets: 125 10*3/uL — ABNORMAL LOW (ref 150–440)
RBC: 2.03 MIL/uL — AB (ref 4.40–5.90)
RDW: 17.3 % — ABNORMAL HIGH (ref 11.5–14.5)
WBC: 2.5 10*3/uL — ABNORMAL LOW (ref 3.8–10.6)

## 2017-03-22 LAB — BASIC METABOLIC PANEL
Anion gap: 10 (ref 5–15)
BUN: 18 mg/dL (ref 6–20)
CO2: 23 mmol/L (ref 22–32)
Calcium: 9.1 mg/dL (ref 8.9–10.3)
Chloride: 103 mmol/L (ref 101–111)
Creatinine, Ser: 0.7 mg/dL (ref 0.61–1.24)
GFR calc Af Amer: >60 mL/min (ref >60)
Glucose, Bld: 124 mg/dL — ABNORMAL HIGH (ref 65–99)
POTASSIUM: 4.9 mmol/L (ref 3.5–5.1)
Sodium: 136 mmol/L (ref 135–145)

## 2017-03-22 LAB — RETICULOCYTES
RBC.: 2.08 MIL/uL — AB (ref 4.40–5.90)
RETIC COUNT ABSOLUTE: 70.7 10*3/uL (ref 19.0–183.0)
Retic Ct Pct: 3.4 % — ABNORMAL HIGH (ref 0.4–3.1)

## 2017-03-22 LAB — LACTATE DEHYDROGENASE: LDH: 157 U/L (ref 98–192)

## 2017-03-22 LAB — SAMPLE TO BLOOD BANK

## 2017-03-22 LAB — PREPARE RBC (CROSSMATCH)

## 2017-03-22 MED ORDER — HEPARIN SOD (PORK) LOCK FLUSH 100 UNIT/ML IV SOLN: 500.0000 [IU] | Freq: Every day | INTRAVENOUS | Status: DC | PRN

## 2017-03-22 MED ORDER — ACETAMINOPHEN 325 MG PO TABS
ORAL_TABLET | ORAL | Status: AC
  Filled 2017-03-22: qty 2

## 2017-03-22 MED ORDER — ACETAMINOPHEN 325 MG PO TABS
650.0000 mg | ORAL_TABLET | Freq: Once | ORAL | Status: AC
  Administered 2017-03-22: 650 mg via ORAL

## 2017-03-22 MED ORDER — SODIUM CHLORIDE 0.9% FLUSH: 10.0000 mL | INTRAVENOUS | Status: DC | PRN

## 2017-03-22 MED ORDER — SODIUM CHLORIDE 0.9 % IV SOLN
250.0000 mL | Freq: Once | INTRAVENOUS | Status: AC
  Administered 2017-03-22: 250 mL via INTRAVENOUS

## 2017-03-22 MED ORDER — HEPARIN SOD (PORK) LOCK FLUSH 100 UNIT/ML IV SOLN: 250.0000 [IU] | INTRAVENOUS | Status: DC | PRN

## 2017-03-22 MED ORDER — GADOBENATE DIMEGLUMINE 529 MG/ML IV SOLN
15.0000 mL | Freq: Once | INTRAVENOUS | Status: AC | PRN
  Administered 2017-03-22: 15 mL via INTRAVENOUS

## 2017-03-22 MED ORDER — SODIUM CHLORIDE 0.9% FLUSH: 3.0000 mL | INTRAVENOUS | Status: DC | PRN

## 2017-03-22 MED ORDER — DIPHENHYDRAMINE HCL 25 MG PO CAPS
25.0000 mg | ORAL_CAPSULE | Freq: Once | ORAL | Status: AC
  Administered 2017-03-22: 25 mg via ORAL

## 2017-03-22 MED ORDER — DIPHENHYDRAMINE HCL 25 MG PO CAPS
ORAL_CAPSULE | ORAL | Status: AC
  Filled 2017-03-22: qty 1

## 2017-03-22 NOTE — Assessment & Plan Note (Addendum)
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 Has now completed Xofigo. 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  Left mandible affected by sclerotic osseous metastatic disease. Likely cause of chin numbness. Will get CT of jaw to further characterize and refer to radiation oncology. follow back up with Dr. Irene Shipper at [facility].  # Symptomatic anemia- Hmg today 6.8, 1 week post 2u pRBCs. Thus far,anemia workup has not shown etiology of anemia.Previously thought to be related to chemotherapy or Xofigo however now complete and worsening hemoglobin. No obvious GI bleed. Given findings of MRI, concern for involvement of bone marrow. Discussed potential risks of bone marrow biopsy including but not limited to, bleeding, infection, and pain. Patient wishes to proceed. Will order 2 units pRBCs for symptomatic anemia.   # thrombocytopenia- 125- Previously thought to be secondary to [facility]. Now questioning disease of bone marrow. Aranesp [last 03/07/17]   # X-geva - MRI does not show evidence of osteonecrosis of jaw. [last 02/14/17 ]; Lupron q 36M- [last 09/06; again due in dec 2018]  #2u pRBCs, CT jaw asap, bone marrow biopsy asap, ref radiation oncology, ref to Dr. Irene Shipper, rtc 04/04/17 for further evaluation.  cc: Dr.McNamara

## 2017-03-22 NOTE — Progress Notes (Signed)
[place] OFFICE PROGRESS NOTE  Patient Care Team: Morayati, Lourdes Sledge, MD as PCP - General (Endocrinology)  Cancer Staging No matching staging information was found for the patient.   Oncology History   1. Carcinoma of prostate diagnosis- 2006.  Had received radiation therapy, external beam to prostate and pelvic lymph nodes.  Had Gleason 9 (4+5.)  PainBaseline PSA was 13. 2. Received Lupron injection after her radiation therapy also.  PSA was going up.  Last year or so received Lupron injection in September but did not have any followup.   3. Recently (this August, 2012) PSA was found to be 17. Patient was started on Lupron from September of 2012. 4. Patient has progressive disease by PSA criteria (October, 2013) 5. Patient was started on Zytiga and prednisone in November of 2013. 6.traumatic hip fracture ((right femur) status post internally fixed ,no evidence of metastatic disease(May of 2014) 7.bone scan 11/2015 (outside institution) shows metastases to left scapula and left 11th rib 8.progressive On ZYTIGA by PSA criteria.  9[name], September of 2015- Progression- Jan 2016 10. AUG 2017- Provenge 2 of planned 3 [sec to logistical issues] 11. SEP 2017- START DOCETAXEL weekly; CT C/A/P-Progressive bone mets [compared to 2016 CT] PSA PROGRESSION; NOV 2017 BONE SCAN STABLE 12. NOV 21st 2017-  CABAZITAXEL q 3 W; 05/02/2016-Added Carboplatin with cycle #3 [given rising PSA] 13. FEB 2018- GUARDIANT TESTING- AR-7/ no BRCA or targettable mutations.  14. # Anemia on aranesp/IV iron [end of may 2018] 15 June 13th 2018 XOFIGO #1 16. Completed 6 cycles Xofigo 03/13/17 17. Anemia 03/14/17- hmg 6.8 - 2u pRBCs.  18. MRI brain 03/22/17     Prostate cancer metastatic to bone [facility])     INTERVAL HISTORY:  Preston Wilson 81 y.o.  male pleasant patient above history of Metastatic prostate cancer status post multiple lines of therapy. Recently completed 6 cycles Xofigo  who returns to clinic for follow-up.   In the interim, patient was seen by symptom management clinic for concerns of fatigue, shortness of breath, dizziness, rapid heartbeat, and a numb chin. 12-lead EKG was consistent with previous. MRI of brain was ordered to further evaluate chin numbness. At that time, patient was found to be anemic with hemoglobin 6.9, MCV 102.4, weight 125. Iron studies were overall normal with elevated ferritin 493. Additional workup showed: folate 37, LDH 153, b12 278, haptoglobin 206. Patient received 1 L of IV fluid and the following day received 2 units of pRBCs.   Today, lab was repeat which shows hemoglobin 6.8, MCV 101.9, platelet 125. Patient reports continued fatigue shortness of breath with exertion, rapid heartbeat. He feels the numbness in his chin has not changed. He reports mild improvement in his symptoms after his blood transfusions. Denies bloody, black, or tarry stools. Denies any frank bleeding Denies bruising. Denies nausea, vomiting, diarrhea. Reports appetite is stable. Denies weight loss. Denies chest pain or cough. Reports intermittent acid reflux improved with Tums. Continues to deny pain. He is accompanied today by his wife.    REVIEW OF SYSTEMS:  A complete 10 point review of system is done which is negative except mentioned above/history of present illness.   PAST MEDICAL HISTORY :  Past Medical History:  Diagnosis Date  . Asthma   . Heart murmur   . Hyperlipidemia   . Hypocalcemia   . Osteomyelitis ([place])   . Prostate cancer ([place])   . SOB (shortness of breath)     PAST SURGICAL HISTORY :  Past Surgical History:  Procedure Laterality Date  . COLONOSCOPY WITH PROPOFOL N/A 04/12/2015   Performed by Lollie Sails, MD at [place]  . EXCISIONAL HEMORRHOIDECTOMY    . TONSILLECTOMY      FAMILY HISTORY :   Family History  Problem Relation Age of Onset  . Colon cancer Father        deceased  . Prostate cancer Brother         deceased  . Other Child        neuroblastoma    SOCIAL HISTORY:   Social History   Tobacco Use  . Smoking status: Current Every Day Smoker    Packs/day: 0.50    Types: Cigarettes  . Smokeless tobacco: Never Used  Substance Use Topics  . Alcohol use: No    Alcohol/week: 0.0 oz  . Drug use: No    ALLERGIES:  has No Known Allergies.  MEDICATIONS:  Current Outpatient Medications  Medication Sig Dispense Refill  . ADVAIR DISKUS 100-50 MCG/DOSE AEPB inhale 1 dose by mouth twice a day  0  . calcium-vitamin D (OSCAL WITH D) 500-200 MG-UNIT per tablet Take 1 tablet by mouth daily with breakfast.     . ferrous sulfate 325 (65 FE) MG tablet Take 325 mg by mouth daily with breakfast.    . Leuprolide Acetate (LUPRON IJ) Inject 1 mL as directed every 3 (three) months.    . Multiple Vitamin (MULTIVITAMIN) tablet Take 1 tablet by mouth daily.    . ondansetron (ZOFRAN) 8 MG tablet 1 pill every 8 hours as needed for nausea/vomitting 40 tablet 1  . predniSONE (DELTASONE) 5 MG tablet Take 1 tablet (5 mg total) by mouth 2 (two) times daily with a meal. (Patient taking differently: Take 5 mg by mouth daily. ) 60 tablet 3  . Vitamin D, Ergocalciferol, (DRISDOL) 50000 UNITS CAPS capsule Take 50,000 Units by mouth once a week. Fridays  0   No current facility-administered medications for this visit.    Facility-Administered Medications Ordered in Other Visits  Medication Dose Route Frequency Provider Last Rate Last Dose  . acetaminophen (TYLENOL) 325 MG tablet           . diphenhydrAMINE (BENADRYL) 25 mg capsule           . heparin lock flush 100 unit/mL  500 Units Intracatheter Daily PRN Charlaine Dalton R, MD      . heparin lock flush 100 unit/mL  250 Units Intracatheter PRN Charlaine Dalton R, MD      . sodium chloride flush (NS) 0.9 % injection 10 mL  10 mL Intracatheter PRN Charlaine Dalton R, MD      . sodium chloride flush (NS) 0.9 % injection 3 mL  3 mL Intracatheter PRN Cammie Sickle, MD        PHYSICAL EXAMINATION: ECOG PERFORMANCE STATUS: 1 - Symptomatic but completely ambulatory  BP 105/62 (Patient Position: Sitting)   Pulse (!) 118   Temp 98.7 F (37.1 C) (Tympanic)   Resp 18   Ht 6' (1.829 m)   Wt 173 lb (78.5 kg)   SpO2 97% Comment: RA  BMI 23.46 kg/m   Filed Weights   03/22/17 1030  Weight: 173 lb (78.5 kg)    Constitutional: elderly, frail-appearing, man accompanied by wife, alert, oriented. No acute distress.   HENT: Head: Normocephalic. Decreased hearing bilaterally chronic, poor dentition.No pallor or icterus. No thrush or ulceration. Neck: Normal range of motion. Neck supple. No JVD present.  Cardiovascular: Tachycardia present. Murmur heard. Systolic murmur is present with a grade of 3/6. No lower extremity edema Pulses: Carotid pulses are on the left side with bruit. No palpable thrill.  Lymphadenopathy: no palpable lymphadenopathy in the cervical, axillary or inguinal regions Pulmonary/Chest: clear to auscultation and  No wheeze or crackles Abdominal: Soft. rounded abdomen soft, non-tender and normal bowel sounds Musculoskeletal: no cyanosis of digits and no clubbing  Neurological: alert & oriented x 3 with fluent speech. no focal motor/sensory deficits Skin: Skin is dry and intact. There is pallor. Psychiatric: Memory, affect and judgment normal.    LABORATORY DATA:  I have reviewed the data as listed    Component Value Date/Time   NA 136 03/22/2017 1010   NA 138 08/25/2014 1442   K 4.9 03/22/2017 1010   K 4.1 08/25/2014 1442   CL 103 03/22/2017 1010   CL 106 08/25/2014 1442   CO2 23 03/22/2017 1010   CO2 25 08/25/2014 1442   GLUCOSE 124 (H) 03/22/2017 1010   GLUCOSE 138 (H) 08/25/2014 1442   BUN 18 03/22/2017 1010   BUN 19 08/25/2014 1442   CREATININE 0.70 03/22/2017 1010   CREATININE 0.53 (L) 08/25/2014 1442   CALCIUM 9.1 03/22/2017 1010   CALCIUM 9.0 08/25/2014 1442   PROT 6.7 03/14/2017 1300   PROT 6.8  08/25/2014 1442   ALBUMIN 3.8 03/14/2017 1300   ALBUMIN 3.9 08/25/2014 1442   AST 28 03/14/2017 1300   AST 18 08/25/2014 1442   ALT 12 (L) 03/14/2017 1300   ALT 13 (L) 08/25/2014 1442   ALKPHOS 53 03/14/2017 1300   ALKPHOS 73 08/25/2014 1442   BILITOT 0.5 03/14/2017 1300   BILITOT 0.7 08/25/2014 1442   GFRNONAA >60 03/22/2017 1010   GFRNONAA >60 08/25/2014 1442   GFRAA >60 03/22/2017 1010   GFRAA >60 08/25/2014 1442    No results found for: SPEP, UPEP  Lab Results  Component Value Date   WBC 2.5 (L) 03/22/2017   NEUTROABS 1.7 03/22/2017   HGB 6.8 (L) 03/22/2017   HCT 20.6 (L) 03/22/2017   MCV 101.9 (H) 03/22/2017   PLT 125 (L) 03/22/2017      Chemistry      Component Value Date/Time   NA 136 03/22/2017 1010   NA 138 08/25/2014 1442   K 4.9 03/22/2017 1010   K 4.1 08/25/2014 1442   CL 103 03/22/2017 1010   CL 106 08/25/2014 1442   CO2 23 03/22/2017 1010   CO2 25 08/25/2014 1442   BUN 18 03/22/2017 1010   BUN 19 08/25/2014 1442   CREATININE 0.70 03/22/2017 1010   CREATININE 0.53 (L) 08/25/2014 1442      Component Value Date/Time   CALCIUM 9.1 03/22/2017 1010   CALCIUM 9.0 08/25/2014 1442   ALKPHOS 53 03/14/2017 1300   ALKPHOS 73 08/25/2014 1442   AST 28 03/14/2017 1300   AST 18 08/25/2014 1442   ALT 12 (L) 03/14/2017 1300   ALT 13 (L) 08/25/2014 1442   BILITOT 0.5 03/14/2017 1300   BILITOT 0.7 08/25/2014 1442       Results for Preston Wilson, Preston Wilson (MRN [id number]) as of 03/22/2017  Ref. Range 09/04/2016 10:30 10/04/2016 13:10 11/01/2016 07:57 11/29/2016 08:12 12/27/16 1004 0.4 02/07/17 03/07/17  PSA Latest Ref Range: 0.00 - 4.00 ng/mL 2,814.00 (H) 2,151.00 (H)       Prostatic Specific Antigen Latest Ref Range: 0.00 - 4.00 ng/mL   1,998.00 (H) 1,911.00 (H) 2109 (H) 1346 (H) 1959 (H)  IMPRESSION: 1. Progressive sclerosis of previously existing widespread osseous metastatic disease. Increase in sclerosis in this fashion is nonspecific but certainly can  be seen in setting of response to therapy. 2. Questionable fracture of the left sacral ala, nondisplaced, correlate with any tenderness along the left posterior sacrum. 3. No adenopathy or prostate gland enlargement. 4. Contrast in the distal esophagus potentially from dysmotility or reflux. 5. Lumbar spondylosis and degenerative disc disease with impingement at L5-S 1. 6.  Aortic Atherosclerosis (ICD10-I70.0).  Electronically Signed   By: Van Clines M.D.   On: 01/29/2017 12:50 --------------------------------------------------------------------     IMPRESSION: Persistent widespread bony metastatic disease without appreciable change from prior studies. Renal uptake is noted bilaterally.  Electronically Signed   By: Lowella Grip III M.D.   On: 01/29/2017 14:40 --------------------------------------------------------------------  RADIOGRAPHIC STUDIES: I have personally reviewed the radiological images as listed and agreed with the findings in the report. Mr Jeri Cos Wo Contrast  Result Date: 03/22/2017 CLINICAL DATA:  81 year old male with prostate cancer metastatic to bones. New unexplained left side chin numbness for 3 weeks. EXAM: MRI HEAD WITHOUT AND WITH CONTRAST TECHNIQUE: Multiplanar, multiecho pulse sequences of the brain and surrounding structures were obtained without and with intravenous contrast. CONTRAST:  43m MULTIHANCE GADOBENATE DIMEGLUMINE 529 MG/ML IV SOLN COMPARISON:  Whole-body bone scan 01/29/2017. FINDINGS: Brain: Incidental bulky dural calcification along the interhemispheric fissure. Cerebral volume is within normal limits for age. No restricted diffusion to suggest acute infarction. No midline shift, mass effect, evidence of mass lesion, ventriculomegaly, extra-axial collection or acute intracranial hemorrhage. Cervicomedullary junction and pituitary are within normal limits. GPearline Cablesand white matter signal is within normal limits for age throughout  the brain. Minimal nonspecific mostly periventricular white matter T2 and FLAIR hyperintensity. No cortical encephalomalacia. No chronic cerebral blood products. Diffuse smooth pachymeningeal thickening and enhancement (series 16, image 16 and series 15, image 125) superimposed on diffuse skull metastatic disease (see below). No areas of nodular or irregular dural thickening. No abnormal brain parenchymal enhancement. Cisternal fifth nerve segments, Meckel cave, and cavernous sinuses appear within normal limits. The exiting V3 trunks appear within normal limits (series 15, image 33 and series 16, image 18; the left V3 appears slightly smaller. Nerve detail at the inferior alveolar nerve level is not adequate on this routine protocol study, but see skeletal findings below. Vascular: Major intracranial vascular flow voids are preserved. Skull and upper cervical spine: Diffuse osseous metastatic disease with near complete replacement of the visualized marrow by sclerotic tumor ; there are only patchy areas of the superior and posterior calvarium which are spared. Mandible involvement by a sclerotic tumor does appear worse on the left (series 16, image 22). Diffuse visible cervical spine involvement. Negative visualized cervical spinal cord. Sinuses/Orbits: Negative orbits soft tissues. Trace paranasal sinus mucosal thickening and small left maxillary sinus retention cyst. Other: Mild mastoid and petrous apex air cell effusions. Negative nasopharynx. Visible internal auditory structures appear normal. Scalp and face soft tissues appear negative. IMPRESSION: 1. Diffuse sclerotic osseous metastatic disease. The left mandible appears more affected than the right, and this constellation is therefore suspicious for Left Mental Nerve involvement or inury by metastatic disease. 2. However, the central 5th Cranial Nerves and bilateral V3 trunks appear normal. 3. Brain MRI appearance suspicious for early diffuse metastatic  involvement of the Pachymeninges, but no parenchymal brain metastasis is identified. Normal for age MRI appearance of the brain parenchyma. Electronically Signed   By: HGenevie AnnM.D.   On:  03/22/2017 09:27     Addendum:   #Worsening anemia-malignancy/? xofigo-recommend a bone marrow biopsy.  For now continue supportive care. #Left mental numbness-no obvious lesion noted for radiation.  CT of the jaw reviewed.  Discussed with Dr. Margaret Pyle discussed with Dr. Marrianne Mood a candidate for any clinical trials given his abnormal counts.  Recommend continued supportive care. #I discussed my recommendations/plan with the patient's wife in detail patient agrees.  # I personally interviewed and examined the patient. Agreed with the below plan of care. Patient/family questions were answered. 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 Has now completed Xofigo. 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  Left mandible affected by sclerotic osseous metastatic disease. Likely cause of chin numbness. Will get CT of jaw to further characterize and refer to radiation oncology. follow back up with Dr. Irene [facility]pper at [facility].  # Symptomatic anemia- Hmg today 6.8, 1 week post 2u pRBCs. Thus far,anemia workup has not shown etiology of anemia.Previously thought to be related to chemotherapy or Xofigo however now complete and worsening hemoglobin. No obvious GI bleed. Given findings of MRI, concern for involvement of bone marrow. Discussed potential risks of bone marrow biopsy including but not limited to, bleeding, infection, and pain. Patient wishes to proceed. Will order 2 units pRBCs for symptomatic anemia.   # thrombocytopenia- 125- Previously thought to be secondary to [facility]. Now questioning disease of bone marrow.  Aranesp [last 03/07/17]   # X-geva - MRI does not show evidence of osteonecrosis of jaw. [last 02/14/17 ]; Lupron q 63M- [last 09/06; again due in dec 2018]  #2u pRBCs, CT jaw asap, bone marrow biopsy asap, ref radiation oncology, ref to Dr. Irene Shipper, rtc 04/04/17 for further evaluation.  cc: Dr.McNamara  Orders Placed This Encounter  Procedures  . Biopsy bone marrow    Standing Status:   Future    Standing Expiration Date:   03/22/2018  . CT MAXILLOFACIAL W & WO CONTRAST    Standing Status:   Future    Standing Expiration Date:   06/22/2018    Order Specific Question:   If indicated for the ordered procedure, I authorize the administration of contrast media per Radiology protocol    Answer:   Yes    Order Specific Question:   Preferred imaging location?    Answer:   [facility]    Order Specific Question:   Call Results- Best Contact Number?    Answer:   [phone]    Order Specific Question:   Radiology Contrast Protocol - do NOT remove file path    Answer:   [email or web address]    Order Specific Question:   Reason for Exam additional comments    Answer:   prostate cancer with bone mets & numb chin. F/u from MRI brain  . CT Biopsy    Comprehensive morphologic evaluation of bone marrow biopsy/ aspirate; flow cytometry; cytogenetics-KARYOTYPE; FISH for MDS. Please call Dr.Brahmanday- if any question- at [phone].    Standing Status:   Future    Standing Expiration Date:   03/22/2018    Order Specific Question:   Lab orders requested (DO NOT place separate lab orders, these will be automatically ordered during procedure specimen collection):    Answer:   Other    Order Specific Question:   Reason for Exam (SYMPTOM  OR DIAGNOSIS REQUIRED)  Answer:   prostate cancer with mets to bone; persistent symptomatic anemia    Order Specific Question:   Preferred imaging location?    Answer:   [facility]    Order Specific Question:   Radiology  Contrast Protocol - do NOT remove file path    Answer:   [email or web address]  . CBC with Differential/Platelet    Standing Status:   Future    Number of Occurrences:   1    Standing Expiration Date:   03/22/2018  . Basic metabolic panel    Standing Status:   Future    Number of Occurrences:   1    Standing Expiration Date:   03/22/2018  . Reticulocytes    Standing Status:   Future    Number of Occurrences:   1    Standing Expiration Date:   03/22/2018  . Ambulatory referral to Radiation Oncology    Referral Priority:   Urgent    Referral Type:   Consultation    Referral Reason:   Specialty Services Required    Referred to Provider:   Noreene Filbert, MD    Requested Specialty:   Radiation Oncology    Number of Visits Requested:   1  . Hold Tube- Blood Bank    Standing Status:   Future    Number of Occurrences:   1    Standing Expiration Date:   03/22/2018     Verlon Au, NP 03/22/2017 4:49 PM

## 2017-03-22 NOTE — Progress Notes (Signed)
C/o left lateral and midline chin numbness. fatigue with ADLs. Patient denies any further episodes of chest pain. C/o shortness of breath with exertion.sats at 97% RA.  Denies any NVD and constipation.

## 2017-03-25 ENCOUNTER — Other Ambulatory Visit: Payer: Self-pay | Admitting: Nurse Practitioner

## 2017-03-25 ENCOUNTER — Other Ambulatory Visit: Payer: Self-pay | Admitting: *Deleted

## 2017-03-25 ENCOUNTER — Inpatient Hospital Stay: Payer: Medicare Other

## 2017-03-25 ENCOUNTER — Ambulatory Visit
Admission: RE | Admit: 2017-03-25 | Discharge: 2017-03-25 | Disposition: A | Discharge status: Home / Self Care | Payer: Medicare Other | Source: Ambulatory Visit | Attending: Nurse Practitioner | Admitting: Nurse Practitioner

## 2017-03-25 DIAGNOSIS — D649 Anemia, unspecified: Secondary | ICD-10-CM

## 2017-03-25 DIAGNOSIS — C7951 Secondary malignant neoplasm of bone: Secondary | ICD-10-CM | POA: Diagnosis not present

## 2017-03-25 DIAGNOSIS — C61 Malignant neoplasm of prostate: Secondary | ICD-10-CM | POA: Insufficient documentation

## 2017-03-25 MED ORDER — SODIUM CHLORIDE 0.9 % IV SOLN
250.0000 mL | Freq: Once | INTRAVENOUS | Status: AC
  Administered 2017-03-25: 250 mL via INTRAVENOUS
  Filled 2017-03-25: qty 250

## 2017-03-25 MED ORDER — ACETAMINOPHEN 325 MG PO TABS
650.0000 mg | ORAL_TABLET | Freq: Once | ORAL | Status: AC
Start: 2017-03-25 — End: 2017-03-25
  Administered 2017-03-25: 650 mg via ORAL

## 2017-03-25 MED ORDER — DIPHENHYDRAMINE HCL 25 MG PO CAPS
25.0000 mg | ORAL_CAPSULE | Freq: Once | ORAL | Status: AC
  Administered 2017-03-25: 25 mg via ORAL

## 2017-03-25 MED ORDER — IOPAMIDOL (ISOVUE-300) INJECTION 61%
75.0000 mL | Freq: Once | INTRAVENOUS | Status: AC | PRN
  Administered 2017-03-25: 75 mL via INTRAVENOUS

## 2017-03-26 ENCOUNTER — Telehealth: Payer: Self-pay | Admitting: Internal Medicine

## 2017-03-26 ENCOUNTER — Ambulatory Visit
Admission: RE | Admit: 2017-03-26 | Discharge: 2017-03-26 | Disposition: A | Discharge status: Home / Self Care | Payer: Medicare Other | Source: Ambulatory Visit | Attending: Radiation Oncology | Admitting: Radiation Oncology

## 2017-03-26 ENCOUNTER — Other Ambulatory Visit: Payer: Self-pay

## 2017-03-26 VITALS — BP 117/66 | HR 113 | Temp 98.0°F | Wt 170.6 lb

## 2017-03-26 DIAGNOSIS — C61 Malignant neoplasm of prostate: Secondary | ICD-10-CM | POA: Diagnosis present

## 2017-03-26 DIAGNOSIS — C7951 Secondary malignant neoplasm of bone: Secondary | ICD-10-CM | POA: Diagnosis not present

## 2017-03-26 DIAGNOSIS — Z923 Personal history of irradiation: Secondary | ICD-10-CM | POA: Diagnosis not present

## 2017-03-26 LAB — TYPE AND SCREEN
ABO/RH(D): O POS
Antibody Screen: NEGATIVE
Unit division: 0
Unit division: 0
Unit division: 0

## 2017-03-26 LAB — BPAM RBC
BLOOD PRODUCT EXPIRATION DATE: 201811262359
BLOOD PRODUCT EXPIRATION DATE: 201812062359
Blood Product Expiration Date: 201811212359
ISSUE DATE / TIME: 201811161312
ISSUE DATE / TIME: 201811191417
UNIT TYPE AND RH: 5100
Unit Type and Rh: 5100
Unit Type and Rh: 9500

## 2017-03-26 NOTE — Progress Notes (Signed)
Radiation Oncology Follow up Note  Name: Preston Wilson   Date:   03/26/2017 MRN:  [id number] DOB: 1935-11-30    This 81 y.o. male presents to the clinic today for evaluation of progressive metastatic disease in patient with known stage IV prostate cancer recently completed 6 cycles of Xofigo.  REFERRING PROVIDER: Lenard Simmer, MD  HPI: Patient is a 81 year old male well known to our department having recently completed 6 cycles of Xofigo for stage IV metastatic prostate cancer. He has presented with some numbness in his submental area on the left side.. Recent MRI scan of his brain showed diffuse sclerotic metastatic lesions with a left mandible appearing more affected than the right. There was also some suspicious findings for early diffuse metastatic involvement of the meninges. He did have a CT scan of his mandible showing metastatic disease throughout the cervical spine skull base and some cortical thickening of the left mandibular body compatible with metastatic disease. This may be encroaching on a submental nerve causing some of his paresthesia. He is having no bone pain at this time. His hemoglobin has been declining as of late and he is had several units of blood transfused. He feels fine today again having no bone pain at this time.  COMPLICATIONS OF TREATMENT: none  FOLLOW UP COMPLIANCE: keeps appointments   PHYSICAL EXAM:  BP 117/66   Pulse (!) 113   Temp 98 F (36.7 C)   Wt 170 lb 10.2 oz (77.4 kg)   BMI 23.14 kg/m patient does have some slight paresthesia in the left submental region of his jaw. Well-developed well-nourished patient in NAD. HEENT reveals PERLA, EOMI, discs not visualized.  Oral cavity is clear. No oral mucosal lesions are identified. Neck is clear without evidence of cervical or supraclavicular adenopathy. Lungs are clear to A&P. Cardiac examination is essentially unremarkable with regular rate and rhythm without murmur rub or thrill. Abdomen  is benign with no organomegaly or masses noted. Motor sensory and DTR levels are equal and symmetric in the upper and lower extremities. Cranial nerves II through XII are grossly intact. Proprioception is intact. No peripheral adenopathy or edema is identified. No motor or sensory levels are noted. Crude visual fields are within normal range.  RADIOLOGY RESULTS: MRI scans of the brain and CT scan of the head and neck region both reviewed and compatible above-stated findings  PLAN: At this time I'm declining to do any further palliative radiation therapy. Certainly some of this metastatic disease in his head and neck region candidate can be residual in nature and previously treated by the radium 225. Blood loss was also of concern and I have prompted the patient to undergo bone marrow biopsy. It is interesting he went through 6 cycles of Xofigo with counts holding fairly steady. Other areas of blood loss including the GI tract should be explored. I've asked to see him back in 2 weeks for follow-up. Will discussed my recognition personally with medical oncology.  I would like to take this opportunity to thank you for allowing me to participate in the care of your patient.Armstead Peaks., MD

## 2017-03-26 NOTE — Telephone Encounter (Signed)
Spoke to Dr.Crystal- no plans for RT.  Spoke to [facility]- no clinical trials available at [facility].No new recommendations. Left message for wife to call.

## 2017-03-27 ENCOUNTER — Telehealth: Payer: Self-pay | Admitting: *Deleted

## 2017-03-27 ENCOUNTER — Telehealth: Payer: Self-pay | Admitting: Internal Medicine

## 2017-03-27 DIAGNOSIS — C61 Malignant neoplasm of prostate: Secondary | ICD-10-CM

## 2017-03-27 DIAGNOSIS — C7951 Secondary malignant neoplasm of bone: Principal | ICD-10-CM

## 2017-03-27 DIAGNOSIS — D649 Anemia, unspecified: Secondary | ICD-10-CM

## 2017-03-27 NOTE — Telephone Encounter (Signed)
Pt's wife returning Dr. Aletha Halim call. MD spoke with patient's wife regarding plan of care. Wife states she has not heard about the bone marrow biopsy date. She would like to f/u on this as well as additional lab apts. Pt's wife notified that she will be contacted with bone marrow biopsy dates as soon as possible as well as the additional lab apts.

## 2017-03-27 NOTE — Telephone Encounter (Signed)
Lab-cbc/bmp-/hold tube in 1 week; possible 1 unit prbc transfusion; discussed with wife the plan. She agrees.

## 2017-03-27 NOTE — Telephone Encounter (Signed)
Lab orders entered per md order. msg sent to scheduling regarding additional apts and to f/u on bone marrow biopsy date.

## 2017-04-01 NOTE — Telephone Encounter (Signed)
Preston Wilson, Has the patient been notified of bone marrow bx date/time?

## 2017-04-02 ENCOUNTER — Inpatient Hospital Stay: Payer: Medicare Other

## 2017-04-02 ENCOUNTER — Other Ambulatory Visit: Payer: Self-pay

## 2017-04-02 DIAGNOSIS — C61 Malignant neoplasm of prostate: Secondary | ICD-10-CM

## 2017-04-02 DIAGNOSIS — D649 Anemia, unspecified: Secondary | ICD-10-CM

## 2017-04-02 DIAGNOSIS — C7951 Secondary malignant neoplasm of bone: Principal | ICD-10-CM

## 2017-04-02 LAB — CBC WITH DIFFERENTIAL/PLATELET
BASOS ABS: 0 10*3/uL (ref 0–0.1)
BASOS PCT: 0 %
EOS ABS: 0 10*3/uL (ref 0–0.7)
EOS PCT: 0 %
HCT: 29.1 % — ABNORMAL LOW (ref 40.0–52.0)
Hemoglobin: 9.8 g/dL — ABNORMAL LOW (ref 13.0–18.0)
Lymphocytes Relative: 35 %
Lymphs Abs: 0.8 10*3/uL — ABNORMAL LOW (ref 1.0–3.6)
MCH: 33 pg (ref 26.0–34.0)
MCHC: 33.7 g/dL (ref 32.0–36.0)
MCV: 98.1 fL (ref 80.0–100.0)
MONO ABS: 0.2 10*3/uL (ref 0.2–1.0)
Monocytes Relative: 9 %
Neutro Abs: 1.3 10*3/uL — ABNORMAL LOW (ref 1.4–6.5)
Neutrophils Relative %: 56 %
PLATELETS: 107 10*3/uL — AB (ref 150–440)
RBC: 2.97 MIL/uL — ABNORMAL LOW (ref 4.40–5.90)
RDW: 17.6 % — AB (ref 11.5–14.5)
WBC: 2.3 10*3/uL — ABNORMAL LOW (ref 3.8–10.6)

## 2017-04-02 LAB — BASIC METABOLIC PANEL
Anion gap: 12 (ref 5–15)
BUN: 19 mg/dL (ref 6–20)
CALCIUM: 8.8 mg/dL — AB (ref 8.9–10.3)
CO2: 23 mmol/L (ref 22–32)
Chloride: 101 mmol/L (ref 101–111)
Creatinine, Ser: 0.72 mg/dL (ref 0.61–1.24)
GFR calc Af Amer: >60 mL/min (ref >60)
GLUCOSE: 138 mg/dL — AB (ref 65–99)
Potassium: 3.8 mmol/L (ref 3.5–5.1)
Sodium: 136 mmol/L (ref 135–145)

## 2017-04-02 LAB — SAMPLE TO BLOOD BANK

## 2017-04-03 ENCOUNTER — Telehealth: Payer: Self-pay | Admitting: *Deleted

## 2017-04-03 NOTE — Telephone Encounter (Signed)
Ok. Thanks. She also showed up in cancer center at 1350pm-and requested to speak to me. I was not available at the time as I was with patients. I went out talk to her at 1411 and was informed that she had left and would try to reach me later.

## 2017-04-03 NOTE — Telephone Encounter (Signed)
Received msg from [place] in [place]-pt's wife contacted cancer center late yesterday afternoon. She would like to speak to [place], RN regarding questions about her husband's bone marrow biopsy. Per Shirlean Mylar, wife wanted to know if bone marrow biopsy was medically necessary given improvement in pt's hgb. RN spoke with Dr. Rogue Bussing- md would still like pt to proceed with bone marrow biopsy on 12/5 as scheduled.  Left vms x 2 that call was being returned.

## 2017-04-03 NOTE — Telephone Encounter (Signed)
Joaquim Lai returned call and we discussed that Dr B wants to proceed with biopsy She had questions regarding risks which I answered to her satisfaction

## 2017-04-04 ENCOUNTER — Ambulatory Visit: Payer: Medicare Other

## 2017-04-04 ENCOUNTER — Ambulatory Visit: Payer: Medicare Other | Admitting: Internal Medicine

## 2017-04-04 ENCOUNTER — Other Ambulatory Visit: Payer: Medicare Other

## 2017-04-09 ENCOUNTER — Other Ambulatory Visit: Payer: Self-pay | Admitting: Radiology

## 2017-04-10 ENCOUNTER — Encounter: Payer: Self-pay | Admitting: Radiation Oncology

## 2017-04-10 ENCOUNTER — Other Ambulatory Visit: Payer: Self-pay

## 2017-04-10 ENCOUNTER — Ambulatory Visit
Admission: RE | Admit: 2017-04-10 | Discharge: 2017-04-10 | Disposition: A | Discharge status: Home / Self Care | Payer: Medicare Other | Source: Ambulatory Visit | Attending: Nurse Practitioner | Admitting: Nurse Practitioner

## 2017-04-10 ENCOUNTER — Ambulatory Visit
Admission: RE | Admit: 2017-04-10 | Discharge: 2017-04-10 | Disposition: A | Discharge status: Home / Self Care | Payer: Medicare Other | Source: Ambulatory Visit | Attending: Radiation Oncology | Admitting: Radiation Oncology

## 2017-04-10 ENCOUNTER — Other Ambulatory Visit (HOSPITAL_COMMUNITY)
Admission: RE | Admit: 2017-04-10 | Disposition: A | Discharge status: Home / Self Care | Payer: Medicare Other | Source: Ambulatory Visit | Attending: Internal Medicine | Admitting: Internal Medicine

## 2017-04-10 VITALS — BP 101/64 | HR 110 | Temp 97.0°F | Wt 169.5 lb

## 2017-04-10 DIAGNOSIS — E785 Hyperlipidemia, unspecified: Secondary | ICD-10-CM | POA: Insufficient documentation

## 2017-04-10 DIAGNOSIS — D649 Anemia, unspecified: Secondary | ICD-10-CM | POA: Diagnosis present

## 2017-04-10 DIAGNOSIS — R200 Anesthesia of skin: Secondary | ICD-10-CM | POA: Diagnosis not present

## 2017-04-10 DIAGNOSIS — Z800 Family history of malignant neoplasm of digestive organs: Secondary | ICD-10-CM | POA: Diagnosis not present

## 2017-04-10 DIAGNOSIS — Z923 Personal history of irradiation: Secondary | ICD-10-CM | POA: Insufficient documentation

## 2017-04-10 DIAGNOSIS — J45909 Unspecified asthma, uncomplicated: Secondary | ICD-10-CM | POA: Diagnosis not present

## 2017-04-10 DIAGNOSIS — C7951 Secondary malignant neoplasm of bone: Secondary | ICD-10-CM | POA: Insufficient documentation

## 2017-04-10 DIAGNOSIS — Z79899 Other long term (current) drug therapy: Secondary | ICD-10-CM | POA: Diagnosis not present

## 2017-04-10 DIAGNOSIS — Z87891 Personal history of nicotine dependence: Secondary | ICD-10-CM | POA: Diagnosis not present

## 2017-04-10 DIAGNOSIS — Z8042 Family history of malignant neoplasm of prostate: Secondary | ICD-10-CM | POA: Diagnosis not present

## 2017-04-10 DIAGNOSIS — C7952 Secondary malignant neoplasm of bone marrow: Secondary | ICD-10-CM | POA: Diagnosis not present

## 2017-04-10 DIAGNOSIS — Z7951 Long term (current) use of inhaled steroids: Secondary | ICD-10-CM | POA: Insufficient documentation

## 2017-04-10 DIAGNOSIS — C61 Malignant neoplasm of prostate: Secondary | ICD-10-CM | POA: Insufficient documentation

## 2017-04-10 LAB — CBC WITH DIFFERENTIAL/PLATELET
BASOS PCT: 1 %
Basophils Absolute: 0 10*3/uL (ref 0–0.1)
EOS ABS: 0 10*3/uL (ref 0–0.7)
Eosinophils Relative: 1 %
HCT: 25.2 % — ABNORMAL LOW (ref 40.0–52.0)
Hemoglobin: 8.4 g/dL — ABNORMAL LOW (ref 13.0–18.0)
LYMPHS ABS: 0.8 10*3/uL — AB (ref 1.0–3.6)
Lymphocytes Relative: 38 %
MCH: 32.3 pg (ref 26.0–34.0)
MCHC: 33.2 g/dL (ref 32.0–36.0)
MCV: 97.5 fL (ref 80.0–100.0)
MONO ABS: 0.2 10*3/uL (ref 0.2–1.0)
MONOS PCT: 10 %
Neutro Abs: 1.1 10*3/uL — ABNORMAL LOW (ref 1.4–6.5)
Neutrophils Relative %: 50 %
Platelets: 113 10*3/uL — ABNORMAL LOW (ref 150–440)
RBC: 2.58 MIL/uL — ABNORMAL LOW (ref 4.40–5.90)
RDW: 18 % — AB (ref 11.5–14.5)
WBC: 2.2 10*3/uL — ABNORMAL LOW (ref 3.8–10.6)

## 2017-04-10 LAB — APTT: APTT: 31 s (ref 24–36)

## 2017-04-10 LAB — PROTIME-INR
INR: 1
PROTHROMBIN TIME: 13.1 s (ref 11.4–15.2)

## 2017-04-10 MED ORDER — MIDAZOLAM HCL 5 MG/5ML IJ SOLN
INTRAMUSCULAR | Status: AC | PRN
  Administered 2017-04-10 (×2): 0.5 mg via INTRAVENOUS
  Administered 2017-04-10: 1 mg via INTRAVENOUS
  Administered 2017-04-10 (×3): 0.5 mg via INTRAVENOUS

## 2017-04-10 MED ORDER — SODIUM CHLORIDE 0.9 % IV SOLN
INTRAVENOUS | Status: DC
  Administered 2017-04-10: 09:00:00 via INTRAVENOUS

## 2017-04-10 MED ORDER — MIDAZOLAM HCL 5 MG/5ML IJ SOLN
INTRAMUSCULAR | Status: AC
  Filled 2017-04-10: qty 5

## 2017-04-10 MED ORDER — FENTANYL CITRATE (PF) 100 MCG/2ML IJ SOLN
INTRAMUSCULAR | Status: AC
  Filled 2017-04-10: qty 4

## 2017-04-10 MED ORDER — HYDROCODONE-ACETAMINOPHEN 5-325 MG PO TABS
1.0000 | ORAL_TABLET | ORAL | Status: DC | PRN
  Filled 2017-04-10: qty 2

## 2017-04-10 MED ORDER — FENTANYL CITRATE (PF) 100 MCG/2ML IJ SOLN
INTRAMUSCULAR | Status: AC | PRN
  Administered 2017-04-10 (×4): 25 ug via INTRAVENOUS

## 2017-04-10 MED ORDER — LIDOCAINE HCL (PF) 1 % IJ SOLN
INTRAMUSCULAR | Status: AC | PRN
  Administered 2017-04-10: 10 mL

## 2017-04-10 NOTE — Consult Note (Addendum)
Chief Complaint: Patient was seen in consultation today for CT-guided bone marrow biopsy at the request of Allen,Lauren G  Referring Physician(s): Allen,Lauren Mayra Neer  Patient Status: [facility] - Out-pt  History of Present Illness: Preston Wilson is a 81 y.o. 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 Patient also has symptomatic anemia and has required blood transfusions. Patient has no new complaints. He does not ambulate well due to lack of energy and fatigue. He also complains of shortness of breath. He denies fevers or chills. No chest pain. The patient has some hearing loss.  Past Medical History:  Diagnosis Date  . Asthma   . Heart murmur   . Hyperlipidemia   . Hypocalcemia   . Osteomyelitis ([place])   . Prostate cancer ([place])   . SOB (shortness of breath)     Past Surgical History:  Procedure Laterality Date  . COLONOSCOPY WITH PROPOFOL N/A 04/12/2015   Procedure: COLONOSCOPY WITH PROPOFOL;  Surgeon: Lollie Sails, MD;  Location: [facility] ENDOSCOPY;  Service: Endoscopy;  Laterality: N/A;  . EXCISIONAL HEMORRHOIDECTOMY    . TONSILLECTOMY      Allergies: Patient has no known allergies.  Medications: Prior to Admission medications   Medication Sig Start Date End Date Taking? Authorizing Provider  ADVAIR DISKUS 100-50 MCG/DOSE AEPB inhale 1 dose by mouth twice a day 02/14/15  Yes [provider]  calcium-vitamin D (OSCAL WITH D) 500-200 MG-UNIT per tablet Take 1 tablet by mouth daily with breakfast.    Yes [provider]  ferrous sulfate 325 (65 FE) MG tablet Take 325 mg by mouth daily with breakfast.   Yes [provider]  Multiple Vitamin (MULTIVITAMIN) tablet Take 1 tablet by mouth daily.   Yes [provider]  predniSONE (DELTASONE) 5 MG tablet Take 1 tablet (5 mg total) by mouth 2 (two) times daily with a meal. Patient taking differently: Take 5 mg by mouth  daily.  09/21/16  Yes Cammie Sickle, MD  Vitamin D, Ergocalciferol, (DRISDOL) 50000 UNITS CAPS capsule Take 50,000 Units by mouth once a week. Fridays 08/04/14  Yes [provider]  Leuprolide Acetate (LUPRON IJ) Inject 1 mL as directed every 3 (three) months.    [provider]  ondansetron (ZOFRAN) 8 MG tablet 1 pill every 8 hours as needed for nausea/vomitting Patient not taking: Reported on 04/10/2017 01/20/16   Cammie Sickle, MD     Family History  Problem Relation Age of Onset  . Colon cancer Father        deceased  . Prostate cancer Brother        deceased  . Other Child        neuroblastoma    Social History   Socioeconomic History  . Marital status: Married    Spouse name: None  . Number of children: None  . Years of education: None  . Highest education level: None  Social Needs  . Financial resource strain: None  . Food insecurity - worry: None  . Food insecurity - inability: None  . Transportation needs - medical: None  . Transportation needs - non-medical: None  Occupational History  . None  Tobacco Use  . Smoking status: Former Smoker    Packs/day: 0.50    Types: Cigarettes    Last attempt to quit: 04/10/2016    Years since quitting: 1.0  . Smokeless tobacco: Never Used  Substance and Sexual Activity  . Alcohol use: No  Alcohol/week: 0.0 oz  . Drug use: No  . Sexual activity: None  Other Topics Concern  . None  Social History Narrative  . None      Review of Systems  Constitutional: Positive for fatigue. Negative for chills and fever.  HENT: Positive for hearing loss.   Respiratory: Positive for shortness of breath.   Cardiovascular: Negative for chest pain and leg swelling.    Vital Signs: BP 117/72   Pulse 98   Temp 97.8 F (36.6 C) (Oral)   Resp 18   SpO2 100%   Physical Exam  Constitutional: No distress.  HENT:  Mouth/Throat: Oropharynx is clear and moist.  Cardiovascular: Normal rate.  Murmur  heard. Pulmonary/Chest: Effort normal and breath sounds normal.  Abdominal: Soft. He exhibits no distension. There is no tenderness.  Musculoskeletal: He exhibits no edema.    Imaging: Mr Jeri Cos NI Contrast  Result Date: 03/22/2017 CLINICAL DATA:  81 year old male with prostate cancer metastatic to bones. New unexplained left side chin numbness for 3 weeks. EXAM: MRI HEAD WITHOUT AND WITH CONTRAST TECHNIQUE: Multiplanar, multiecho pulse sequences of the brain and surrounding structures were obtained without and with intravenous contrast. CONTRAST:  67m MULTIHANCE GADOBENATE DIMEGLUMINE 529 MG/ML IV SOLN COMPARISON:  Whole-body bone scan 01/29/2017. FINDINGS: Brain: Incidental bulky dural calcification along the interhemispheric fissure. Cerebral volume is within normal limits for age. No restricted diffusion to suggest acute infarction. No midline shift, mass effect, evidence of mass lesion, ventriculomegaly, extra-axial collection or acute intracranial hemorrhage. Cervicomedullary junction and pituitary are within normal limits. GPearline Cablesand white matter signal is within normal limits for age throughout the brain. Minimal nonspecific mostly periventricular white matter T2 and FLAIR hyperintensity. No cortical encephalomalacia. No chronic cerebral blood products. Diffuse smooth pachymeningeal thickening and enhancement (series 16, image 16 and series 15, image 125) superimposed on diffuse skull metastatic disease (see below). No areas of nodular or irregular dural thickening. No abnormal brain parenchymal enhancement. Cisternal fifth nerve segments, Meckel cave, and cavernous sinuses appear within normal limits. The exiting V3 trunks appear within normal limits (series 15, image 33 and series 16, image 18; the left V3 appears slightly smaller. Nerve detail at the inferior alveolar nerve level is not adequate on this routine protocol study, but see skeletal findings below. Vascular: Major intracranial  vascular flow voids are preserved. Skull and upper cervical spine: Diffuse osseous metastatic disease with near complete replacement of the visualized marrow by sclerotic tumor ; there are only patchy areas of the superior and posterior calvarium which are spared. Mandible involvement by a sclerotic tumor does appear worse on the left (series 16, image 22). Diffuse visible cervical spine involvement. Negative visualized cervical spinal cord. Sinuses/Orbits: Negative orbits soft tissues. Trace paranasal sinus mucosal thickening and small left maxillary sinus retention cyst. Other: Mild mastoid and petrous apex air cell effusions. Negative nasopharynx. Visible internal auditory structures appear normal. Scalp and face soft tissues appear negative. IMPRESSION: 1. Diffuse sclerotic osseous metastatic disease. The left mandible appears more affected than the right, and this constellation is therefore suspicious for Left Mental Nerve involvement or inury by metastatic disease. 2. However, the central 5th Cranial Nerves and bilateral V3 trunks appear normal. 3. Brain MRI appearance suspicious for early diffuse metastatic involvement of the Pachymeninges, but no parenchymal brain metastasis is identified. Normal for age MRI appearance of the brain parenchyma. Electronically Signed   By: HGenevie AnnM.D.   On: 03/22/2017 09:27   Ct Maxillofacial W Contrast  Result  Date: 03/25/2017 CLINICAL DATA:  Metastatic prostate cancer.  Abnormal MRI head EXAM: CT MAXILLOFACIAL WITH CONTRAST TECHNIQUE: Multidetector CT imaging of the maxillofacial structures was performed with intravenous contrast. Multiplanar CT image reconstructions were also generated. CONTRAST:  26m ISOVUE-300 IOPAMIDOL (ISOVUE-300) INJECTION 61% COMPARISON:  03/22/2017 FINDINGS: Osseous: Mild cortical thickening of the body of the mandible on the left consistent with metastatic disease. In addition, there is a non erupted left third molar which accounts for some  of the finding on MRI. Extensive sclerotic metastatic disease throughout the cervical spine as well as in the central skullbase and clivus. Orbits: Bilateral ocular surgery.  No orbital mass. Sinuses: Mild mucosal edema paranasal sinuses without air-fluid level Soft tissues: Negative for soft tissue mass or edema Limited intracranial: Negative.  See recent MRI results. IMPRESSION: Bony metastatic disease throughout the cervical spine and central skullbase. Cortical thickening left mandibular body compatible with metastatic disease. Electronically Signed   By: CFranchot GalloM.D.   On: 03/25/2017 12:41   Nm Xofigo Injection  Result Date: 03/13/2017  XTrudi Idawas injected intravenously in Nuclear Medicine under the supervision of the attending radiologist    Labs:  CBC: Recent Labs    03/07/17 1118 03/14/17 1300 03/22/17 1010 04/02/17 0817  WBC 2.7* 3.1* 2.5* 2.3*  HGB 9.6* 6.9* 6.8* 9.8*  HCT 28.2* 20.4* 20.6* 29.1*  PLT 124* 125* 125* 107*    COAGS: No results for input(s): INR, APTT in the last 8760 hours.  BMP: Recent Labs    03/07/17 1118 03/14/17 1300 03/22/17 1010 04/02/17 0817  NA 134* 132* 136 136  K 4.4 4.7 4.9 3.8  CL 101 99* 103 101  CO2 _0 GLUCOSE 122* 113* 124* 138*  BUN 20 32* 18 19  CALCIUM 9.4 9.3 9.1 8.8*  CREATININE 0.60* 0.54* 0.70 0.72  GFRNONAA >60 >60 >60 >60  GFRAA >60 >60 >60 >60    LIVER FUNCTION TESTS: Recent Labs    12/27/16 1004 02/07/17 0900 03/07/17 1118 03/14/17 1300  BILITOT 0.9 0.7 0.6 0.5  AST _1 ALT 15* 14* 14* 12*  ALKPHOS 67 57 56 53  PROT 7.1 6.8 6.9 6.7  ALBUMIN 4.2 3.9 4.0 3.8    TUMOR MARKERS: No results for input(s): AFPTM, CEA, CA199, CHROMGRNA in the last 8760 hours.  Assessment and Plan:  81year old male with prostate cancer metastatic to bone. Patient has known diffuse skeletal involvement and being worked up for anemia. Scheduled for bone marrow biopsy. CT-guided biopsy was discussed with  patient. Wife is concerned that we will be spreading the cancer by doing a biopsy. I explained to the patient and wife that the cancer has already spread diffusely throughout the skeleton. Patient has a good understanding of procedure and informed consent was obtained. Plan for CT-guided bone marrow biopsy with moderate sedation.  Thank you for this interesting consult.  I greatly enjoyed meeting Preston Moungerand look forward to participating in their care.  A copy of this report was sent to the requesting provider on this date.  Electronically Signed: ABurman Riis MD 04/10/2017, 8:34 AM   I spent a total of  15 Minutes   in face to face in clinical consultation, greater than 50% of which was counseling/coordinating care for CT-guided bone marrow biopsy.

## 2017-04-10 NOTE — Progress Notes (Signed)
Radiation Oncology Follow up Note  Name: Preston Wilson   Date:   04/10/2017 MRN:  [id number] DOB: 1935/06/20    This 81 y.o. male presents to the clinic today for follow-up for evaluation of progressive metastatic disease in patient status post macro X for castrate resistant stage IV prostate cancer.  REFERRING PROVIDER: Lenard Simmer, MD  HPI: Patient is a 81 year old male recently completedXofigo treatment forecast resistant stage IV prostate cancer. He is had some numbness in his left mandible most likely related to nerve compression for metastatic disease he is having no bone pain at this time. He's been having trouble with his blood counts today they attempted a bone marrow biopsy although from initial report it looks like a sample was poor.    COMPLICATIONS OF TREATMENT: none  FOLLOW UP COMPLIANCE: keeps appointments   PHYSICAL EXAM:  BP 101/64   Pulse (!) 110   Temp (!) 97 F (36.1 C)   Wt 169 lb 8.5 oz (76.9 kg)   BMI 22.99 kg/m  Frail-appearing elderly male in NAD. Well-developed well-nourished patient in NAD. HEENT reveals PERLA, EOMI, discs not visualized.  Oral cavity is clear. No oral mucosal lesions are identified. Neck is clear without evidence of cervical or supraclavicular adenopathy. Lungs are clear to A&P. Cardiac examination is essentially unremarkable with regular rate and rhythm without murmur rub or thrill. Abdomen is benign with no organomegaly or masses noted. Motor sensory and DTR levels are equal and symmetric in the upper and lower extremities. Cranial nerves II through XII are grossly intact. Proprioception is intact. No peripheral adenopathy or edema is identified. No motor or sensory levels are noted. Crude visual fields are within normal range.  RADIOLOGY RESULTS: No current films for review   PLAN: At this time I am going to continue to observe the patient. I have talked to medical oncology about other treatment options at this time  rather than palliative radiation therapy which we all agree upon. They will continue to monitor his blood counts although it is our Sumption this is related to bone marrow infiltration of his prostate cancer. I have asked to see him back in 3 months for follow-up. He continues close follow-up care with medical oncology. Patient knows to call with any concerns.  I would like to take this opportunity to thank you for allowing me to participate in the care of your patient.Armstead Peaks., MD

## 2017-04-10 NOTE — Procedures (Signed)
CT guided bone marrow biopsy.  No material could be aspirated.  2 cores obtained (one with bone drill).  Minimal blood loss and no immediate complication.

## 2017-04-11 ENCOUNTER — Other Ambulatory Visit: Payer: Self-pay

## 2017-04-11 ENCOUNTER — Inpatient Hospital Stay (HOSPITAL_BASED_OUTPATIENT_CLINIC_OR_DEPARTMENT_OTHER): Payer: Medicare Other | Admitting: Internal Medicine

## 2017-04-11 ENCOUNTER — Inpatient Hospital Stay: Payer: Medicare Other

## 2017-04-11 ENCOUNTER — Inpatient Hospital Stay: Payer: Medicare Other | Attending: Internal Medicine

## 2017-04-11 VITALS — BP 119/71 | HR 117 | Temp 97.8°F | Resp 18

## 2017-04-11 DIAGNOSIS — Z79818 Long term (current) use of other agents affecting estrogen receptors and estrogen levels: Secondary | ICD-10-CM | POA: Diagnosis not present

## 2017-04-11 DIAGNOSIS — C7951 Secondary malignant neoplasm of bone: Secondary | ICD-10-CM | POA: Diagnosis not present

## 2017-04-11 DIAGNOSIS — Z8042 Family history of malignant neoplasm of prostate: Secondary | ICD-10-CM

## 2017-04-11 DIAGNOSIS — R011 Cardiac murmur, unspecified: Secondary | ICD-10-CM | POA: Insufficient documentation

## 2017-04-11 DIAGNOSIS — M479 Spondylosis, unspecified: Secondary | ICD-10-CM

## 2017-04-11 DIAGNOSIS — Z87891 Personal history of nicotine dependence: Secondary | ICD-10-CM | POA: Insufficient documentation

## 2017-04-11 DIAGNOSIS — Z800 Family history of malignant neoplasm of digestive organs: Secondary | ICD-10-CM | POA: Insufficient documentation

## 2017-04-11 DIAGNOSIS — C61 Malignant neoplasm of prostate: Secondary | ICD-10-CM | POA: Diagnosis not present

## 2017-04-11 DIAGNOSIS — D696 Thrombocytopenia, unspecified: Secondary | ICD-10-CM

## 2017-04-11 DIAGNOSIS — R200 Anesthesia of skin: Secondary | ICD-10-CM | POA: Diagnosis not present

## 2017-04-11 DIAGNOSIS — Z79899 Other long term (current) drug therapy: Secondary | ICD-10-CM | POA: Diagnosis not present

## 2017-04-11 DIAGNOSIS — E785 Hyperlipidemia, unspecified: Secondary | ICD-10-CM | POA: Diagnosis not present

## 2017-04-11 DIAGNOSIS — J45909 Unspecified asthma, uncomplicated: Secondary | ICD-10-CM | POA: Diagnosis not present

## 2017-04-11 DIAGNOSIS — D649 Anemia, unspecified: Secondary | ICD-10-CM | POA: Insufficient documentation

## 2017-04-11 DIAGNOSIS — I700 Atherosclerosis of aorta: Secondary | ICD-10-CM

## 2017-04-11 LAB — CBC WITH DIFFERENTIAL/PLATELET
BASOS ABS: 0 10*3/uL (ref 0–0.1)
BASOS PCT: 0 %
Eosinophils Absolute: 0 10*3/uL (ref 0–0.7)
Eosinophils Relative: 0 %
HEMATOCRIT: 25 % — AB (ref 40.0–52.0)
HEMOGLOBIN: 8.4 g/dL — AB (ref 13.0–18.0)
Lymphocytes Relative: 29 %
Lymphs Abs: 0.8 10*3/uL — ABNORMAL LOW (ref 1.0–3.6)
MCH: 33.2 pg (ref 26.0–34.0)
MCHC: 33.6 g/dL (ref 32.0–36.0)
MCV: 99 fL (ref 80.0–100.0)
MONO ABS: 0.3 10*3/uL (ref 0.2–1.0)
Monocytes Relative: 10 %
NEUTROS ABS: 1.6 10*3/uL (ref 1.4–6.5)
NEUTROS PCT: 61 %
Platelets: 117 10*3/uL — ABNORMAL LOW (ref 150–440)
RBC: 2.53 MIL/uL — AB (ref 4.40–5.90)
RDW: 18 % — AB (ref 11.5–14.5)
WBC: 2.7 10*3/uL — AB (ref 3.8–10.6)

## 2017-04-11 LAB — COMPREHENSIVE METABOLIC PANEL
ALK PHOS: 66 U/L (ref 38–126)
ALT: 13 U/L — ABNORMAL LOW (ref 17–63)
ANION GAP: 8 (ref 5–15)
AST: 27 U/L (ref 15–41)
Albumin: 3.7 g/dL (ref 3.5–5.0)
BILIRUBIN TOTAL: 0.6 mg/dL (ref 0.3–1.2)
BUN: 20 mg/dL (ref 6–20)
CALCIUM: 9.1 mg/dL (ref 8.9–10.3)
CO2: 25 mmol/L (ref 22–32)
Chloride: 101 mmol/L (ref 101–111)
Creatinine, Ser: 0.61 mg/dL (ref 0.61–1.24)
Glucose, Bld: 124 mg/dL — ABNORMAL HIGH (ref 65–99)
Potassium: 4.2 mmol/L (ref 3.5–5.1)
Sodium: 134 mmol/L — ABNORMAL LOW (ref 135–145)
TOTAL PROTEIN: 6.6 g/dL (ref 6.5–8.1)

## 2017-04-11 LAB — PSA: Prostatic Specific Antigen: 2826 ng/mL — ABNORMAL HIGH (ref 0.00–4.00)

## 2017-04-11 MED ORDER — LEUPROLIDE ACETATE (3 MONTH) 22.5 MG IM KIT
22.5000 mg | PACK | Freq: Once | INTRAMUSCULAR | Status: AC
  Administered 2017-04-11: 22.5 mg via INTRAMUSCULAR
  Filled 2017-04-11: qty 22.5

## 2017-04-11 NOTE — Progress Notes (Signed)
[facility] OFFICE PROGRESS NOTE  Patient Care Team: Morayati, Lourdes Sledge, MD as PCP - General (Endocrinology)  Cancer Staging No matching staging information was found for the patient.   Oncology History   1. Carcinoma of prostate diagnosis- 2006.  Had received radiation therapy, external beam to prostate and pelvic lymph nodes.  Had Gleason 9 (4+5.)  PainBaseline PSA was 13. 2. Received Lupron injection after her radiation therapy also.  PSA was going up.  Last year or so received Lupron injection in September but did not have any followup.   3. Recently (this August, 2012) PSA was found to be 17. Patient was started on Lupron from September of 2012. 4. Patient has progressive disease by PSA criteria (October, 2013) 5. Patient was started on Zytiga and prednisone in November of 2013. 6.traumatic hip fracture ((right femur) status post internally fixed ,no evidence of metastatic disease(May of 2014) 7.bone scan 11/2015 (outside institution) shows metastases to left scapula and left 11th rib 8.progressive On ZYTIGA by PSA criteria.  9[name], September of 2015- Progression- Jan 2016 10. AUG 2017- Provenge 2 of planned 3 [sec to logistical issues] 11. SEP 2017- START DOCETAXEL weekly; CT C/A/P-Progressive bone mets [compared to 2016 CT] PSA PROGRESSION; NOV 2017 BONE SCAN STABLE 12. NOV 21st 2017-  CABAZITAXEL q 3 W; 05/02/2016-Added Carboplatin with cycle #3 [given rising PSA] 13. FEB 2018- GUARDIANT TESTING- AR-7/ no BRCA or targettable mutations.  14. # Anemia on aranesp/IV iron [end of may 2018] 15 June 13th 2018 XOFIGO #1 16. Completed 6 cycles Xofigo 03/13/17 17. Anemia 03/14/17- hmg 6.8 - 2u pRBCs.  18. MRI brain 03/22/17     Prostate cancer metastatic to bone [facility])     INTERVAL HISTORY:  Preston Wilson 81 y.o.  male pleasant patient above history of Metastatic prostate cancer status post multiple lines of therapy. Recently completed 6 cycles  Xofigo-last treatment in November 2018.   Patient continues to have mild left-sided chin numbness; for which he had a CT scan of the jaw-which showed diffuse osseous metastatic disease.  Recent MRI of the brain showed mild enhancement of the pachymeningitis.  No parenchymal metastases.  CT abdomen pelvis-September 2018 showed stable osseous metastatic disease no obvious  visceral metastases.  Given the ongoing anemia patient had a bone marrow biopsy yesterday.  Aspirate could not be obtained.   Patient continues to be asymptomatic.  No new shortness of breath or cough.  Mild shortness of breath on exertion.  No chest pain.  No syncopal attacks.  Denies any headaches nausea vomiting or neck pain.  REVIEW OF SYSTEMS:  A complete 10 point review of system is done which is negative except mentioned above/history of present illness.   PAST MEDICAL HISTORY :  Past Medical History:  Diagnosis Date  . Asthma   . Heart murmur   . Hyperlipidemia   . Hypocalcemia   . Osteomyelitis ([place])   . Prostate cancer ([place])   . SOB (shortness of breath)     PAST SURGICAL HISTORY :   Past Surgical History:  Procedure Laterality Date  . COLONOSCOPY WITH PROPOFOL N/A 04/12/2015   Procedure: COLONOSCOPY WITH PROPOFOL;  Surgeon: Lollie Sails, MD;  Location: [facility] ENDOSCOPY;  Service: Endoscopy;  Laterality: N/A;  . EXCISIONAL HEMORRHOIDECTOMY    . TONSILLECTOMY      FAMILY HISTORY :   Family History  Problem Relation Age of Onset  . Colon cancer Father        deceased  .  Prostate cancer Brother        deceased  . Other Child        neuroblastoma    SOCIAL HISTORY:   Social History   Tobacco Use  . Smoking status: Former Smoker    Packs/day: 0.50    Types: Cigarettes    Last attempt to quit: 04/10/2016    Years since quitting: 1.0  . Smokeless tobacco: Never Used  Substance Use Topics  . Alcohol use: No    Alcohol/week: 0.0 oz  . Drug use: No    ALLERGIES:  has No Known  Allergies.  MEDICATIONS:  Current Outpatient Medications  Medication Sig Dispense Refill  . ADVAIR DISKUS 100-50 MCG/DOSE AEPB inhale 1 dose by mouth twice a day  0  . calcium-vitamin D (OSCAL WITH D) 500-200 MG-UNIT per tablet Take 1 tablet by mouth daily with breakfast.     . ferrous sulfate 325 (65 FE) MG tablet Take 325 mg by mouth 2 (two) times daily with a meal.     . Leuprolide Acetate (LUPRON IJ) Inject 1 mL as directed every 3 (three) months.    . Multiple Vitamin (MULTIVITAMIN) tablet Take 1 tablet by mouth daily.    . predniSONE (DELTASONE) 5 MG tablet Take 1 tablet (5 mg total) by mouth 2 (two) times daily with a meal. (Patient taking differently: Take 5 mg by mouth daily. ) 60 tablet 3  . Vitamin D, Ergocalciferol, (DRISDOL) 50000 UNITS CAPS capsule Take 50,000 Units by mouth once a week. Fridays  0  . ondansetron (ZOFRAN) 8 MG tablet 1 pill every 8 hours as needed for nausea/vomitting (Patient not taking: Reported on 04/10/2017) 40 tablet 1   No current facility-administered medications for this visit.     PHYSICAL EXAMINATION: ECOG PERFORMANCE STATUS: 1 - Symptomatic but completely ambulatory  BP 119/71   Pulse (!) 117   Temp 97.8 F (36.6 C) (Tympanic)   Resp 18   There were no vitals filed for this visit.  Constitutional: elderly, frail-appearing, man accompanied by wife, alert, oriented. No acute distress.   HENT: Head: Normocephalic. Decreased hearing bilaterally chronic, poor dentition.No pallor or icterus. No thrush or ulceration. Neck: Normal range of motion. Neck supple. No JVD present.  Cardiovascular: Tachycardia present. Murmur heard. Systolic murmur is present with a grade of 3/6. No lower extremity edema Pulses: Carotid pulses are on the left side with bruit. No palpable thrill.  Lymphadenopathy: no palpable lymphadenopathy in the cervical, axillary or inguinal regions Pulmonary/Chest: clear to auscultation and  No wheeze or crackles Abdominal: Soft.  rounded abdomen soft, non-tender and normal bowel sounds Musculoskeletal: no cyanosis of digits and no clubbing  Neurological: alert & oriented x 3 with fluent speech. no focal motor/sensory deficits Skin: Skin is dry and intact. There is pallor. Psychiatric: Memory, affect and judgment normal.    LABORATORY DATA:  I have reviewed the data as listed    Component Value Date/Time   NA 134 (L) 04/11/2017 1008   NA 138 08/25/2014 1442   K 4.2 04/11/2017 1008   K 4.1 08/25/2014 1442   CL 101 04/11/2017 1008   CL 106 08/25/2014 1442   CO2 25 04/11/2017 1008   CO2 25 08/25/2014 1442   GLUCOSE 124 (H) 04/11/2017 1008   GLUCOSE 138 (H) 08/25/2014 1442   BUN 20 04/11/2017 1008   BUN 19 08/25/2014 1442   CREATININE 0.61 04/11/2017 1008   CREATININE 0.53 (L) 08/25/2014 1442   CALCIUM 9.1 04/11/2017 1008  CALCIUM 9.0 08/25/2014 1442   PROT 6.6 04/11/2017 1008   PROT 6.8 08/25/2014 1442   ALBUMIN 3.7 04/11/2017 1008   ALBUMIN 3.9 08/25/2014 1442   AST 27 04/11/2017 1008   AST 18 08/25/2014 1442   ALT 13 (L) 04/11/2017 1008   ALT 13 (L) 08/25/2014 1442   ALKPHOS 66 04/11/2017 1008   ALKPHOS 73 08/25/2014 1442   BILITOT 0.6 04/11/2017 1008   BILITOT 0.7 08/25/2014 1442   GFRNONAA >60 04/11/2017 1008   GFRNONAA >60 08/25/2014 1442   GFRAA >60 04/11/2017 1008   GFRAA >60 08/25/2014 1442    No results found for: SPEP, UPEP  Lab Results  Component Value Date   WBC 2.7 (L) 04/11/2017   NEUTROABS 1.6 04/11/2017   HGB 8.4 (L) 04/11/2017   HCT 25.0 (L) 04/11/2017   MCV 99.0 04/11/2017   PLT 117 (L) 04/11/2017      Chemistry      Component Value Date/Time   NA 134 (L) 04/11/2017 1008   NA 138 08/25/2014 1442   K 4.2 04/11/2017 1008   K 4.1 08/25/2014 1442   CL 101 04/11/2017 1008   CL 106 08/25/2014 1442   CO2 25 04/11/2017 1008   CO2 25 08/25/2014 1442   BUN 20 04/11/2017 1008   BUN 19 08/25/2014 1442   CREATININE 0.61 04/11/2017 1008   CREATININE 0.53 (L) 08/25/2014  1442      Component Value Date/Time   CALCIUM 9.1 04/11/2017 1008   CALCIUM 9.0 08/25/2014 1442   ALKPHOS 66 04/11/2017 1008   ALKPHOS 73 08/25/2014 1442   AST 27 04/11/2017 1008   AST 18 08/25/2014 1442   ALT 13 (L) 04/11/2017 1008   ALT 13 (L) 08/25/2014 1442   BILITOT 0.6 04/11/2017 1008   BILITOT 0.7 08/25/2014 1442       Results for Preston Wilson, Preston Wilson (MRN [id number]) as of 03/22/2017  Ref. Range 09/04/2016 10:30 10/04/2016 13:10 11/01/2016 07:57 11/29/2016 08:12 12/27/16 1004 0.4 02/07/17 03/07/17  PSA Latest Ref Range: 0.00 - 4.00 ng/mL 2,814.00 (H) 2,151.00 (H)       Prostatic Specific Antigen Latest Ref Range: 0.00 - 4.00 ng/mL   1,998.00 (H) 1,911.00 (H) 2109 (H) 1346 (H) 1959 (H)   IMPRESSION: 1. Progressive sclerosis of previously existing widespread osseous metastatic disease. Increase in sclerosis in this fashion is nonspecific but certainly can be seen in setting of response to therapy. 2. Questionable fracture of the left sacral ala, nondisplaced, correlate with any tenderness along the left posterior sacrum. 3. No adenopathy or prostate gland enlargement. 4. Contrast in the distal esophagus potentially from dysmotility or reflux. 5. Lumbar spondylosis and degenerative disc disease with impingement at L5-S 1. 6.  Aortic Atherosclerosis (ICD10-I70.0).  Electronically Signed   By: Van Clines M.D.   On: 01/29/2017 12:50 --------------------------------------------------------------------     IMPRESSION: Persistent widespread bony metastatic disease without appreciable change from prior studies. Renal uptake is noted bilaterally.  Electronically Signed   By: Lowella Grip III M.D.   On: 01/29/2017 14:40 --------------------------------------------------------------------  RADIOGRAPHIC STUDIES: I have personally reviewed the radiological images as listed and agreed with the findings in the report. Ct Biopsy  Result Date:  04/10/2017 INDICATION: 81 year old with prostate cancer metastatic to the bone. Anemia workup. EXAM: CT GUIDED BONE MARROW ASPIRATES AND BIOPSY Physician: Stephan Minister. Anselm Pancoast, MD MEDICATIONS: None. ANESTHESIA/SEDATION: Fentanyl 100 mcg IV; Versed 3.5 mg IV Moderate Sedation Time:  36 minutes The patient was continuously monitored during the procedure by  the interventional radiology nurse under my direct supervision. COMPLICATIONS: None immediate. PROCEDURE: The procedure was explained to the patient. The risks and benefits of the procedure were discussed and the patient's questions were addressed. Informed consent was obtained from the patient. The patient was placed prone on CT table. Images of the pelvis were obtained. The right side of back was prepped and draped in sterile fashion. The skin and right posterior ilium were anesthetized with 1% lidocaine. 11 gauge bone needle was directed into the right ilium with CT guidance. No material could be aspirated. It was very difficult to obtain a core biopsy due to the sclerotic bone. Small core sample was obtained with the 11 gauge bone needle. Subsequently, the [employer] bone drill was directed into the right ilium. Again, no material could be aspirated. An adequate core biopsy was obtained. Bandage placed over the puncture site. IMPRESSION: CT guided bone marrow biopsy. Aspirate material could not be obtained. Electronically Signed   By: Markus Daft M.D.   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 S/p Trudi Ida number 12 March 2017.  #Most recent PSA November 2018-progression.  CT chest and pelvis September 2018-no obvious visceral disease; stable/progressive bone metastases.  #Left chin numbness-question involvement of the mental nerve.  However no discrete impingement noted.  Evaluated by radiation oncology; no role for radiation at  this time.  #MRI brain- ? early diffuse metastatic involvement of pachymeniges but no parenchymal brain metastasis seen.  Patient not significant symptomatic.  Patient has progressive nausea vomiting headaches-repeat MRI/LP could be considered.  Monitor for now.  # severe anemia- s/p transfusion; today- Hb-8.4; s/p Bone marrow biopsy- awaiting results.  Hold Aranesp this time.   # thrombocytopenia- 110-120s.  Previously thought to be secondary to [employer]. Now questioning disease of bone marrow.  Await bone marrow biopsy.    # dec 18th mebane/ hold tube results of bone marrow biopsy. No aranesp. Lupron today. X-geva in [place].    Orders Placed This Encounter  Procedures  . CBC with Differential    Standing Status:   Future    Standing Expiration Date:   04/11/2018  . Comprehensive metabolic panel    Standing Status:   Future    Standing Expiration Date:   04/11/2018  . Hold Tube- Blood Bank    Standing Status:   Future    Standing Expiration Date:   04/11/2018     Cammie Sickle, MD 04/11/2017 1:45 PM

## 2017-04-11 NOTE — Assessment & Plan Note (Addendum)
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 S/p Trudi Ida number 12 March 2017.  #Most recent PSA November 2018-progression.  CT chest and pelvis September 2018-no obvious visceral disease; stable/progressive bone metastases.  #Left chin numbness-question involvement of the mental nerve.  However no discrete impingement noted.  Evaluated by radiation oncology; no role for radiation at this time.  #MRI brain- ? early diffuse metastatic involvement of pachymeniges but no parenchymal brain metastasis seen.  Patient not significant symptomatic.  Patient has progressive nausea vomiting headaches-repeat MRI/LP could be considered.  Monitor for now.  # severe anemia- s/p transfusion; today- Hb-8.4; s/p Bone marrow biopsy- awaiting results.  Hold Aranesp this time.   # thrombocytopenia- 110-120s.  Previously thought to be secondary to [place]. Now questioning disease of bone marrow.  Await bone marrow biopsy.    # dec 18th mebane/ hold tube results of bone marrow biopsy. No aranesp. Lupron today. X-geva in [place].

## 2017-04-12 ENCOUNTER — Telehealth: Payer: Self-pay | Admitting: *Deleted

## 2017-04-12 NOTE — Telephone Encounter (Signed)
-----   Message from Cammie Sickle, MD sent at 04/11/2017  4:22 PM EST ----- Please inform patient's wife that PSA continues to go up -however no new recommendations; continue follow up as planned.

## 2017-04-12 NOTE — Telephone Encounter (Signed)
I personally spoke with patient's wife regarding results.

## 2017-04-17 ENCOUNTER — Telehealth: Payer: Self-pay | Admitting: *Deleted

## 2017-04-17 NOTE — Telephone Encounter (Signed)
Per Dr. Rogue Bussing - Rn Contacted patient with bone marrow results. Spoke with patient's wife. Explained that results demonstrated metastatic prostate cancer in bone marrow. I explained that this was the cause of the patient's anemia. Pt's wife gave verbal understanding and stated that she appreciated the update. She will speak further with Dr. Rogue Bussing next week about the results. Also wife mentioned that pt had a physical exam with Dr. Ronnald Collum today. The pcp also drew a hgb today as well. Wife doesn't know these results at this time due to labs being sent to [place].

## 2017-04-18 ENCOUNTER — Telehealth: Payer: Self-pay | Admitting: *Deleted

## 2017-04-18 NOTE — Telephone Encounter (Signed)
Incoming call from patient's wife. She contacted pt's pcp's office to obtain hgb results. She states that the pcp reported hgb at 5.5. Pt's wife requesting husband to be scheduled for blood transfusion asap. I personally told the wife that I needed to contact pcp office to confirm these values and receive a fax from their office to further advise. I called Morayti's office. Confirmed that pt's hgb was 8.4 and hgb a1C was 5.5. These results will be faxed to cancer center. Returned wife's call-reassured patient's wife that she was given results of the hgb-a1c per pcp office rather than the hgb/hct results. Wife thanked me for clarifying these results. Reassured wife that labs are stable and no need for a blood transfusion at this time. Will keep apts on Tuesday as scheduled.

## 2017-04-23 ENCOUNTER — Inpatient Hospital Stay: Payer: Medicare Other

## 2017-04-23 ENCOUNTER — Other Ambulatory Visit: Payer: Self-pay | Admitting: *Deleted

## 2017-04-23 ENCOUNTER — Inpatient Hospital Stay (HOSPITAL_BASED_OUTPATIENT_CLINIC_OR_DEPARTMENT_OTHER): Payer: Medicare Other | Admitting: Internal Medicine

## 2017-04-23 VITALS — BP 113/70 | HR 109 | Temp 97.8°F | Resp 20 | Ht 72.0 in | Wt 169.0 lb

## 2017-04-23 DIAGNOSIS — C7951 Secondary malignant neoplasm of bone: Principal | ICD-10-CM

## 2017-04-23 DIAGNOSIS — Z8042 Family history of malignant neoplasm of prostate: Secondary | ICD-10-CM

## 2017-04-23 DIAGNOSIS — Z79818 Long term (current) use of other agents affecting estrogen receptors and estrogen levels: Secondary | ICD-10-CM

## 2017-04-23 DIAGNOSIS — M479 Spondylosis, unspecified: Secondary | ICD-10-CM | POA: Diagnosis not present

## 2017-04-23 DIAGNOSIS — J45909 Unspecified asthma, uncomplicated: Secondary | ICD-10-CM

## 2017-04-23 DIAGNOSIS — D696 Thrombocytopenia, unspecified: Secondary | ICD-10-CM | POA: Diagnosis not present

## 2017-04-23 DIAGNOSIS — Z800 Family history of malignant neoplasm of digestive organs: Secondary | ICD-10-CM

## 2017-04-23 DIAGNOSIS — I700 Atherosclerosis of aorta: Secondary | ICD-10-CM

## 2017-04-23 DIAGNOSIS — C61 Malignant neoplasm of prostate: Secondary | ICD-10-CM

## 2017-04-23 DIAGNOSIS — R011 Cardiac murmur, unspecified: Secondary | ICD-10-CM

## 2017-04-23 DIAGNOSIS — Z79899 Other long term (current) drug therapy: Secondary | ICD-10-CM | POA: Diagnosis not present

## 2017-04-23 DIAGNOSIS — D649 Anemia, unspecified: Secondary | ICD-10-CM | POA: Diagnosis not present

## 2017-04-23 DIAGNOSIS — R200 Anesthesia of skin: Secondary | ICD-10-CM | POA: Diagnosis not present

## 2017-04-23 DIAGNOSIS — E785 Hyperlipidemia, unspecified: Secondary | ICD-10-CM

## 2017-04-23 DIAGNOSIS — Z87891 Personal history of nicotine dependence: Secondary | ICD-10-CM

## 2017-04-23 LAB — CBC WITH DIFFERENTIAL/PLATELET
BASOS ABS: 0 10*3/uL (ref 0–0.1)
Basophils Relative: 1 %
Eosinophils Absolute: 0 10*3/uL (ref 0–0.7)
Eosinophils Relative: 0 %
HEMATOCRIT: 23.2 % — AB (ref 40.0–52.0)
HEMOGLOBIN: 7.8 g/dL — AB (ref 13.0–18.0)
LYMPHS ABS: 0.9 10*3/uL — AB (ref 1.0–3.6)
LYMPHS PCT: 30 %
MCH: 33 pg (ref 26.0–34.0)
MCHC: 33.4 g/dL (ref 32.0–36.0)
MCV: 98.8 fL (ref 80.0–100.0)
Monocytes Absolute: 0.2 10*3/uL (ref 0.2–1.0)
Monocytes Relative: 8 %
NEUTROS ABS: 1.9 10*3/uL (ref 1.4–6.5)
Neutrophils Relative %: 61 %
Platelets: 117 10*3/uL — ABNORMAL LOW (ref 150–440)
RBC: 2.35 MIL/uL — AB (ref 4.40–5.90)
RDW: 18 % — ABNORMAL HIGH (ref 11.5–14.5)
WBC: 3 10*3/uL — AB (ref 3.8–10.6)

## 2017-04-23 LAB — SAMPLE TO BLOOD BANK

## 2017-04-23 LAB — COMPREHENSIVE METABOLIC PANEL
ALBUMIN: 3.6 g/dL (ref 3.5–5.0)
ALK PHOS: 72 U/L (ref 38–126)
ALT: 13 U/L — ABNORMAL LOW (ref 17–63)
ANION GAP: 11 (ref 5–15)
AST: 28 U/L (ref 15–41)
BUN: 18 mg/dL (ref 6–20)
CHLORIDE: 101 mmol/L (ref 101–111)
CO2: 25 mmol/L (ref 22–32)
Calcium: 8.8 mg/dL — ABNORMAL LOW (ref 8.9–10.3)
Creatinine, Ser: 0.62 mg/dL (ref 0.61–1.24)
GFR calc non Af Amer: >60 mL/min (ref >60)
Glucose, Bld: 127 mg/dL — ABNORMAL HIGH (ref 65–99)
POTASSIUM: 4.4 mmol/L (ref 3.5–5.1)
SODIUM: 137 mmol/L (ref 135–145)
Total Bilirubin: 0.6 mg/dL (ref 0.3–1.2)
Total Protein: 6.8 g/dL (ref 6.5–8.1)

## 2017-04-23 LAB — PREPARE RBC (CROSSMATCH)

## 2017-04-23 MED ORDER — DENOSUMAB 120 MG/1.7ML ~~LOC~~ SOLN
120.0000 mg | Freq: Once | SUBCUTANEOUS | Status: AC
  Administered 2017-04-23: 120 mg via SUBCUTANEOUS

## 2017-04-23 NOTE — Progress Notes (Signed)
Preston Wilson OFFICE PROGRESS NOTE  Patient Care Team: Morayati, Lourdes Sledge, MD as PCP - General (Endocrinology)  Cancer Staging No matching staging information was found for the patient.   Oncology History   1. Carcinoma of prostate diagnosis- 2006.  Had received radiation therapy, external beam to prostate and pelvic lymph nodes.  Had Gleason 9 (4+5.)  PainBaseline PSA was 13. 2. Received Lupron injection after her radiation therapy also.  PSA was going up.  Last year or so received Lupron injection in September but did not have any followup.   3. Recently (this August, 2012) PSA was found to be 17. Patient was started on Lupron from September of 2012. 4. Patient has progressive disease by PSA criteria (October, 2013) 5. Patient was started on Zytiga and prednisone in November of 2013. 6.traumatic hip fracture ((right femur) status post internally fixed ,no evidence of metastatic disease(May of 2014) 7.bone scan 11/2015 (outside institution) shows metastases to left scapula and left 11th rib 8.progressive On ZYTIGA by PSA criteria.  9Gillermina Wilson, September of 2015- Progression- Jan 2016 10. AUG 2017- Provenge 2 of planned 3 [sec to logistical issues] 11. SEP 2017- START DOCETAXEL weekly; CT C/A/P-Progressive bone mets [compared to 2016 CT] PSA PROGRESSION; NOV 2017 BONE SCAN STABLE 12. NOV 21st 2017-  CABAZITAXEL q 3 W; 05/02/2016-Added Carboplatin with cycle #3 [given rising PSA] 13. FEB 2018- GUARDIANT TESTING- AR-7/ no BRCA or targettable mutations.  14. # Anemia on aranesp/IV iron [end of may 2018] 15 June 13th 2018 XOFIGO #1 16. Completed 6 cycles Xofigo 03/13/17 17. Anemia 03/14/17- hmg 6.8 - 2u pRBCs.  18. MRI brain 03/22/17  # DEC 2018- BMBx- [severe anemia]-involvement of bone marrow by prostate cancer.     Prostate cancer metastatic to bone [facility])     INTERVAL HISTORY:  Preston Wilson 81 y.o.  male pleasant patient above history of Metastatic  prostate cancer status post multiple lines of therapy; most recently completed 6 cycles Xofigo-last treatment in November 2018.  Is here to review the results of his bone marrow biopsy; aspirate could not be obtained.  Patient continues to have anemia-needing PRBC transfusion when less than 8.  The last transfusion was approximately 3 weeks ago.  He continues to have left-sided chin numbness which is not any worse.  He denies any headaches.  Denies any nausea vomiting.  Patient continues to be asymptomatic.  No new shortness of breath or cough.  Mild shortness of breath on exertion.  No chest pain.  No syncopal attacks.    REVIEW OF SYSTEMS:  A complete 10 point review of system is done which is negative except mentioned above/history of present illness.   PAST MEDICAL HISTORY :  Past Medical History:  Diagnosis Date  . Asthma   . Heart murmur   . Hyperlipidemia   . Hypocalcemia   . Osteomyelitis ([place])   . Prostate cancer ([place])   . SOB (shortness of breath)     PAST SURGICAL HISTORY :   Past Surgical History:  Procedure Laterality Date  . COLONOSCOPY WITH PROPOFOL N/A 04/12/2015   Procedure: COLONOSCOPY WITH PROPOFOL;  Surgeon: Preston Sails, MD;  Location: [facility] ENDOSCOPY;  Service: Endoscopy;  Laterality: N/A;  . EXCISIONAL HEMORRHOIDECTOMY    . TONSILLECTOMY      FAMILY HISTORY :   Family History  Problem Relation Age of Onset  . Colon cancer Father        deceased  . Prostate cancer Brother  deceased  . Other Child        neuroblastoma    SOCIAL HISTORY:   Social History   Tobacco Use  . Smoking status: Former Smoker    Packs/day: 0.50    Types: Cigarettes    Last attempt to quit: 04/10/2016    Years since quitting: 1.0  . Smokeless tobacco: Never Used  Substance Use Topics  . Alcohol use: No    Alcohol/week: 0.0 oz  . Drug use: No    ALLERGIES:  has No Known Allergies.  MEDICATIONS:  Current Outpatient Medications  Medication Sig Dispense  Refill  . ADVAIR DISKUS 100-50 MCG/DOSE AEPB inhale 1 dose by mouth twice a day  0  . calcium-vitamin D (OSCAL WITH D) 500-200 MG-UNIT per tablet Take 1 tablet by mouth daily with breakfast.     . ferrous sulfate 325 (65 FE) MG tablet Take 325 mg by mouth 2 (two) times daily with a meal.     . Leuprolide Acetate (LUPRON IJ) Inject 1 mL as directed every 3 (three) months.    . Multiple Vitamin (MULTIVITAMIN) tablet Take 1 tablet by mouth daily.    . predniSONE (DELTASONE) 5 MG tablet Take 1 tablet (5 mg total) by mouth 2 (two) times daily with a meal. (Patient taking differently: Take 5 mg by mouth daily. ) 60 tablet 3  . Vitamin D, Ergocalciferol, (DRISDOL) 50000 UNITS CAPS capsule Take 50,000 Units by mouth once a week. Fridays  0  . ondansetron (ZOFRAN) 8 MG tablet 1 pill every 8 hours as needed for nausea/vomitting (Patient not taking: Reported on 04/10/2017) 40 tablet 1   No current facility-administered medications for this visit.     PHYSICAL EXAMINATION: ECOG PERFORMANCE STATUS: 1 - Symptomatic but completely ambulatory  BP 113/70 (BP Location: Left Arm)   Pulse (!) 109   Temp 97.8 F (36.6 C) (Tympanic)   Resp 20   Ht 6' (1.829 m)   Wt 169 lb (76.7 kg)   BMI 22.92 kg/m   Filed Weights   04/23/17 1030  Weight: 169 lb (76.7 kg)    Constitutional: elderly, frail-appearing, man accompanied by wife, alert, oriented. No acute distress.   HENT: Head: Normocephalic. Decreased hearing bilaterally chronic, poor dentition.No pallor or icterus. No thrush or ulceration. Neck: Normal range of motion. Neck supple. No JVD present.  Cardiovascular: Tachycardia present. Murmur heard. Systolic murmur is present with a grade of 3/6. No lower extremity edema Pulses: Carotid pulses are on the left side with bruit. No palpable thrill.  Lymphadenopathy: no palpable lymphadenopathy in the cervical, axillary or inguinal regions Pulmonary/Chest: clear to auscultation and  No wheeze or  crackles Abdominal: Soft. rounded abdomen soft, non-tender and normal bowel sounds Musculoskeletal: no cyanosis of digits and no clubbing  Neurological: alert & oriented x 3 with fluent speech. no focal motor/sensory deficits Skin: Skin is dry and intact. There is pallor. Psychiatric: Memory, affect and judgment normal.    LABORATORY DATA:  I have reviewed the data as listed    Component Value Date/Time   NA 137 04/23/2017 1011   NA 138 08/25/2014 1442   K 4.4 04/23/2017 1011   K 4.1 08/25/2014 1442   CL 101 04/23/2017 1011   CL 106 08/25/2014 1442   CO2 25 04/23/2017 1011   CO2 25 08/25/2014 1442   GLUCOSE 127 (H) 04/23/2017 1011   GLUCOSE 138 (H) 08/25/2014 1442   BUN 18 04/23/2017 1011   BUN 19 08/25/2014 1442   CREATININE  0.62 04/23/2017 1011   CREATININE 0.53 (L) 08/25/2014 1442   CALCIUM 8.8 (L) 04/23/2017 1011   CALCIUM 9.0 08/25/2014 1442   PROT 6.8 04/23/2017 1011   PROT 6.8 08/25/2014 1442   ALBUMIN 3.6 04/23/2017 1011   ALBUMIN 3.9 08/25/2014 1442   AST 28 04/23/2017 1011   AST 18 08/25/2014 1442   ALT 13 (L) 04/23/2017 1011   ALT 13 (L) 08/25/2014 1442   ALKPHOS 72 04/23/2017 1011   ALKPHOS 73 08/25/2014 1442   BILITOT 0.6 04/23/2017 1011   BILITOT 0.7 08/25/2014 1442   GFRNONAA >60 04/23/2017 1011   GFRNONAA >60 08/25/2014 1442   GFRAA >60 04/23/2017 1011   GFRAA >60 08/25/2014 1442    No results found for: SPEP, UPEP  Lab Results  Component Value Date   WBC 3.0 (L) 04/23/2017   NEUTROABS 1.9 04/23/2017   HGB 7.8 (L) 04/23/2017   HCT 23.2 (L) 04/23/2017   MCV 98.8 04/23/2017   PLT 117 (L) 04/23/2017      Chemistry      Component Value Date/Time   NA 137 04/23/2017 1011   NA 138 08/25/2014 1442   K 4.4 04/23/2017 1011   K 4.1 08/25/2014 1442   CL 101 04/23/2017 1011   CL 106 08/25/2014 1442   CO2 25 04/23/2017 1011   CO2 25 08/25/2014 1442   BUN 18 04/23/2017 1011   BUN 19 08/25/2014 1442   CREATININE 0.62 04/23/2017 1011    CREATININE 0.53 (L) 08/25/2014 1442      Component Value Date/Time   CALCIUM 8.8 (L) 04/23/2017 1011   CALCIUM 9.0 08/25/2014 1442   ALKPHOS 72 04/23/2017 1011   ALKPHOS 73 08/25/2014 1442   AST 28 04/23/2017 1011   AST 18 08/25/2014 1442   ALT 13 (L) 04/23/2017 1011   ALT 13 (L) 08/25/2014 1442   BILITOT 0.6 04/23/2017 1011   BILITOT 0.7 08/25/2014 1442      IMPRESSION: 1. Progressive sclerosis of previously existing widespread osseous metastatic disease. Increase in sclerosis in this fashion is nonspecific but certainly can be seen in setting of response to therapy. 2. Questionable fracture of the left sacral ala, nondisplaced, correlate with any tenderness along the left posterior sacrum. 3. No adenopathy or prostate gland enlargement. 4. Contrast in the distal esophagus potentially from dysmotility or reflux. 5. Lumbar spondylosis and degenerative disc disease with impingement at L5-S 1. 6.  Aortic Atherosclerosis (ICD10-I70.0).  Electronically Signed   By: Van Clines M.D.   On: 01/29/2017 12:50 --------------------------------------------------------------------     IMPRESSION: Persistent widespread bony metastatic disease without appreciable change from prior studies. Renal uptake is noted bilaterally.  Electronically Signed   By: Lowella Grip III M.D.   On: 01/29/2017 14:40 --------------------------------------------------------------------  Results for KEMO, SPRUCE (MRN [id number]) as of 04/23/2017 10:45  Ref. Range 11/29/2016 08:12 12/27/2016 10:04 02/07/2017 09:00 03/07/2017 11:03 04/11/2017 10:22  Prostatic Specific Antigen Latest Ref Range: 0.00 - 4.00 ng/mL 1,911.00 (H) 2,109.00 (H) 1,346.00 (H) 1,959.00 (H) 2,826.00 (H)    RADIOGRAPHIC STUDIES: I have personally reviewed the radiological images as listed and agreed with the findings in the report. No results found.    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 S/p Trudi Ida number 12 March 2017. Lupron every 3 months [04/11/2017]  # Most recent PSA November 2018-progression. CT chest and pelvis September 2018-no obvious visceral disease; stable/progressive bone metastases;  but PSA rising.  Given his multiple lines of therapy-no standard options available.  Not a candidate for clinical trials-given his severe anemia.  He is awaiting repeat appointment at [facility] at the end of this week.  #Left chin numbness-question involvement of the mental nerve.  STABLE.  #MRI brain- ? early diffuse metastatic involvement of pachymeniges but no parenchymal brain metastasis seen. Asymptomatic; monitor for now.  # Severe anemia- s/p transfusion; today- Hb-7.8 today; plan transfusion on 12/20. Continue 1 pill of iron a day. Bone marrow Bx-positive for involvement of the marrow with prostatic adenocarcinoma.  # Thrombocytopenia- 110-120s-metastatic disease/ xofigo.      #  X-geva today; every 2 weeks/ cbc-hold tube; follow up in 4 weeks/labs-hold tube/PSA;MD.    Orders Placed This Encounter  Procedures  . CBC with Differential/Platelet    Standing Status:   Future    Standing Expiration Date:   04/23/2018  . CBC with Differential/Platelet    Standing Status:   Future    Standing Expiration Date:   04/23/2018  . Comprehensive metabolic panel    Standing Status:   Future    Standing Expiration Date:   04/23/2018  . PSA    Standing Status:   Future    Standing Expiration Date:   04/23/2018  . Hold Tube- Blood Bank    Standing Status:   Future    Standing Expiration Date:   04/23/2018  . Hold Tube- Blood Bank    Standing Status:   Future    Standing Expiration Date:   04/23/2018     Cammie Sickle, MD 04/23/2017 11:30 AM

## 2017-04-23 NOTE — Patient Instructions (Signed)

## 2017-04-23 NOTE — Assessment & Plan Note (Addendum)
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 S/p Trudi Ida number 12 March 2017. Lupron every 3 months [04/11/2017]  # Most recent PSA November 2018-progression. CT chest and pelvis September 2018-no obvious visceral disease; stable/progressive bone metastases; but PSA rising.  Given his multiple lines of therapy-no standard options available.  Not a candidate for clinical trials-given his severe anemia.  He is awaiting repeat appointment at [facility] at the end of this week.  #Left chin numbness-question involvement of the mental nerve.  STABLE.  #MRI brain- ? early diffuse metastatic involvement of pachymeniges but no parenchymal brain metastasis seen. Asymptomatic; monitor for now.  # Severe anemia- s/p transfusion; today- Hb-7.8 today; plan transfusion on 12/20. Continue 1 pill of iron a day. Bone marrow Bx-positive for involvement of the marrow with prostatic adenocarcinoma.  # Thrombocytopenia- 110-120s-metastatic disease/ xofigo.      #  X-geva today; every 2 weeks/ cbc-hold tube; follow up in 4 weeks/labs-hold tube/PSA;MD.

## 2017-04-25 ENCOUNTER — Encounter (HOSPITAL_COMMUNITY): Payer: Self-pay

## 2017-04-25 ENCOUNTER — Inpatient Hospital Stay: Payer: Medicare Other

## 2017-04-25 DIAGNOSIS — D649 Anemia, unspecified: Secondary | ICD-10-CM

## 2017-04-25 DIAGNOSIS — C7951 Secondary malignant neoplasm of bone: Secondary | ICD-10-CM | POA: Diagnosis not present

## 2017-04-25 LAB — CHROMOSOME ANALYSIS, BONE MARROW

## 2017-04-25 MED ORDER — ACETAMINOPHEN 325 MG PO TABS
650.0000 mg | ORAL_TABLET | Freq: Once | ORAL | Status: AC
  Administered 2017-04-25: 650 mg via ORAL
  Filled 2017-04-25: qty 2

## 2017-04-25 MED ORDER — SODIUM CHLORIDE 0.9 % IV SOLN
250.0000 mL | Freq: Once | INTRAVENOUS | Status: AC
  Administered 2017-04-25: 250 mL via INTRAVENOUS
  Filled 2017-04-25: qty 250

## 2017-04-25 MED ORDER — DIPHENHYDRAMINE HCL 25 MG PO CAPS
25.0000 mg | ORAL_CAPSULE | Freq: Once | ORAL | Status: AC
  Administered 2017-04-25: 25 mg via ORAL
  Filled 2017-04-25: qty 1

## 2017-04-26 LAB — TYPE AND SCREEN
ABO/RH(D): O POS
Antibody Screen: NEGATIVE
UNIT DIVISION: 0

## 2017-04-26 LAB — BPAM RBC
BLOOD PRODUCT EXPIRATION DATE: 201812252359
ISSUE DATE / TIME: 201812201044
UNIT TYPE AND RH: 9500

## 2017-05-08 ENCOUNTER — Inpatient Hospital Stay: Payer: Medicare Other | Attending: Internal Medicine

## 2017-05-08 DIAGNOSIS — C7951 Secondary malignant neoplasm of bone: Secondary | ICD-10-CM | POA: Diagnosis not present

## 2017-05-08 DIAGNOSIS — D649 Anemia, unspecified: Secondary | ICD-10-CM | POA: Diagnosis not present

## 2017-05-08 DIAGNOSIS — C61 Malignant neoplasm of prostate: Secondary | ICD-10-CM | POA: Diagnosis present

## 2017-05-08 DIAGNOSIS — R200 Anesthesia of skin: Secondary | ICD-10-CM | POA: Insufficient documentation

## 2017-05-08 DIAGNOSIS — D696 Thrombocytopenia, unspecified: Secondary | ICD-10-CM | POA: Insufficient documentation

## 2017-05-08 DIAGNOSIS — R0609 Other forms of dyspnea: Secondary | ICD-10-CM | POA: Insufficient documentation

## 2017-05-08 DIAGNOSIS — R5383 Other fatigue: Secondary | ICD-10-CM | POA: Insufficient documentation

## 2017-05-08 LAB — CBC WITH DIFFERENTIAL/PLATELET
Basophils Absolute: 0 10*3/uL (ref 0–0.1)
Basophils Relative: 1 %
EOS ABS: 0 10*3/uL (ref 0–0.7)
Eosinophils Relative: 0 %
HCT: 24.3 % — ABNORMAL LOW (ref 40.0–52.0)
Hemoglobin: 8.2 g/dL — ABNORMAL LOW (ref 13.0–18.0)
LYMPHS ABS: 0.9 10*3/uL — AB (ref 1.0–3.6)
Lymphocytes Relative: 34 %
MCH: 33.8 pg (ref 26.0–34.0)
MCHC: 34 g/dL (ref 32.0–36.0)
MCV: 99.6 fL (ref 80.0–100.0)
MONO ABS: 0.3 10*3/uL (ref 0.2–1.0)
MONOS PCT: 10 %
Neutro Abs: 1.4 10*3/uL (ref 1.4–6.5)
Neutrophils Relative %: 55 %
Platelets: 113 10*3/uL — ABNORMAL LOW (ref 150–440)
RBC: 2.44 MIL/uL — ABNORMAL LOW (ref 4.40–5.90)
RDW: 19.2 % — AB (ref 11.5–14.5)
WBC: 2.5 10*3/uL — ABNORMAL LOW (ref 3.8–10.6)

## 2017-05-08 LAB — SAMPLE TO BLOOD BANK

## 2017-05-09 ENCOUNTER — Inpatient Hospital Stay: Payer: Medicare Other

## 2017-05-13 ENCOUNTER — Encounter: Payer: Self-pay | Admitting: *Deleted

## 2017-05-13 ENCOUNTER — Encounter: Payer: Self-pay | Admitting: Internal Medicine

## 2017-05-13 ENCOUNTER — Telehealth: Payer: Self-pay | Admitting: *Deleted

## 2017-05-13 NOTE — Telephone Encounter (Signed)
Spoke to Renita Papa, RN in regards to scan of EKG dated under Media tab as 04/13/17. States that patient is stating that EKG has been labeled incorrectly as 24 Hour Holter Monitor. Asked if possibly this information could be changed. I called IT and spoke to [place] who put in ticket #[phone] to get information corrected

## 2017-05-13 NOTE — Telephone Encounter (Signed)
Patient's wife called with two medical records concerns. 1. Patient has an EKG that was scanned into chart on 04/12/17 that was labeled "holter monitor." Wife concerned that patient did not have an EKG or a Holter Monitor performed on 04/12/17. I personally reviewed the chart. I explained to her that the EKG that was performed on 03/14/17 was scanned into the chart on 04/12/17. I acknowledge the patient's wife's concerns and reassured pt's wife that there is not an additional charge for 04/12/17 DOS. Wife asked that this be addressed to medical records to determine if the title of the test document could be changed. I have fwd this request to our medical records team to determine if this title could be change.  She also voiced concerns that her husband had an apt marked 'no show' for a blood transfusion (1/3). Dr. B told patient he didn't need to come for it. Wife would like this to be changed to reflect cnl apt instead of NO Show status. I have forward this request to the scheduling team to determine if this status could be changed on the apt.

## 2017-05-21 ENCOUNTER — Inpatient Hospital Stay: Payer: Medicare Other

## 2017-05-21 ENCOUNTER — Other Ambulatory Visit: Payer: Self-pay

## 2017-05-21 ENCOUNTER — Inpatient Hospital Stay (HOSPITAL_BASED_OUTPATIENT_CLINIC_OR_DEPARTMENT_OTHER): Payer: Medicare Other | Admitting: Internal Medicine

## 2017-05-21 ENCOUNTER — Encounter: Payer: Self-pay | Admitting: Internal Medicine

## 2017-05-21 ENCOUNTER — Other Ambulatory Visit: Payer: Self-pay | Admitting: Internal Medicine

## 2017-05-21 ENCOUNTER — Other Ambulatory Visit: Payer: Self-pay | Admitting: *Deleted

## 2017-05-21 VITALS — BP 121/69 | HR 123 | Temp 97.6°F | Resp 20 | Ht 72.0 in | Wt 165.2 lb

## 2017-05-21 DIAGNOSIS — C61 Malignant neoplasm of prostate: Secondary | ICD-10-CM | POA: Diagnosis not present

## 2017-05-21 DIAGNOSIS — C7951 Secondary malignant neoplasm of bone: Secondary | ICD-10-CM

## 2017-05-21 DIAGNOSIS — R200 Anesthesia of skin: Secondary | ICD-10-CM

## 2017-05-21 DIAGNOSIS — D6481 Anemia due to antineoplastic chemotherapy: Secondary | ICD-10-CM

## 2017-05-21 DIAGNOSIS — R0609 Other forms of dyspnea: Secondary | ICD-10-CM | POA: Diagnosis not present

## 2017-05-21 DIAGNOSIS — R5383 Other fatigue: Secondary | ICD-10-CM

## 2017-05-21 DIAGNOSIS — D696 Thrombocytopenia, unspecified: Secondary | ICD-10-CM

## 2017-05-21 DIAGNOSIS — D649 Anemia, unspecified: Secondary | ICD-10-CM

## 2017-05-21 DIAGNOSIS — T451X5A Adverse effect of antineoplastic and immunosuppressive drugs, initial encounter: Secondary | ICD-10-CM

## 2017-05-21 LAB — COMPREHENSIVE METABOLIC PANEL
ALT: 13 U/L — AB (ref 17–63)
AST: 29 U/L (ref 15–41)
Albumin: 3.8 g/dL (ref 3.5–5.0)
Alkaline Phosphatase: 103 U/L (ref 38–126)
Anion gap: 12 (ref 5–15)
BUN: 21 mg/dL — AB (ref 6–20)
CHLORIDE: 97 mmol/L — AB (ref 101–111)
CO2: 24 mmol/L (ref 22–32)
CREATININE: 0.63 mg/dL (ref 0.61–1.24)
Calcium: 8.8 mg/dL — ABNORMAL LOW (ref 8.9–10.3)
GFR calc Af Amer: >60 mL/min (ref >60)
GFR calc non Af Amer: >60 mL/min (ref >60)
GLUCOSE: 119 mg/dL — AB (ref 65–99)
POTASSIUM: 4.3 mmol/L (ref 3.5–5.1)
Sodium: 133 mmol/L — ABNORMAL LOW (ref 135–145)
Total Bilirubin: 0.8 mg/dL (ref 0.3–1.2)
Total Protein: 7.3 g/dL (ref 6.5–8.1)

## 2017-05-21 LAB — PREPARE RBC (CROSSMATCH)

## 2017-05-21 LAB — CBC WITH DIFFERENTIAL/PLATELET
Basophils Absolute: 0 10*3/uL (ref 0–0.1)
Basophils Relative: 0 %
EOS ABS: 0 10*3/uL (ref 0–0.7)
Eosinophils Relative: 0 %
HEMATOCRIT: 22.6 % — AB (ref 40.0–52.0)
HEMOGLOBIN: 7.8 g/dL — AB (ref 13.0–18.0)
LYMPHS ABS: 0.8 10*3/uL — AB (ref 1.0–3.6)
Lymphocytes Relative: 27 %
MCH: 34.1 pg — AB (ref 26.0–34.0)
MCHC: 34.4 g/dL (ref 32.0–36.0)
MCV: 99.2 fL (ref 80.0–100.0)
MONOS PCT: 9 %
Monocytes Absolute: 0.3 10*3/uL (ref 0.2–1.0)
NEUTROS ABS: 1.7 10*3/uL (ref 1.4–6.5)
NEUTROS PCT: 64 %
Platelets: 118 10*3/uL — ABNORMAL LOW (ref 150–440)
RBC: 2.27 MIL/uL — ABNORMAL LOW (ref 4.40–5.90)
RDW: 18.9 % — ABNORMAL HIGH (ref 11.5–14.5)
WBC: 2.8 10*3/uL — ABNORMAL LOW (ref 3.8–10.6)

## 2017-05-21 LAB — PSA: Prostatic Specific Antigen: >3000 ng/mL — ABNORMAL HIGH (ref 0.00–4.00)

## 2017-05-21 LAB — SAMPLE TO BLOOD BANK

## 2017-05-21 MED ORDER — DENOSUMAB 120 MG/1.7ML ~~LOC~~ SOLN
120.0000 mg | Freq: Once | SUBCUTANEOUS | Status: AC
  Administered 2017-05-21: 120 mg via SUBCUTANEOUS

## 2017-05-21 NOTE — Patient Instructions (Signed)

## 2017-05-21 NOTE — Assessment & Plan Note (Addendum)
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 S/p Trudi Ida number 12 March 2017. Lupron Q 3 months [04/11/2017]  # Most recent PSA November 2018-progression. CT chest and pelvis September 2018-no obvious visceral disease; stable/progressive bone metastases; but PSA rising.    # Given his multiple lines of therapy-no standard options available.  Not a candidate for clinical trials-given his severe anemia s/p recent evaluation with Dr[facility]MCnamara at [facility].  Reviewed the note from [place].   # Again discussed with patient and wife that unfortunately prognosis is poor; I doubt if patient will be a candidate for future clinical trials given his ongoing anemia/involvement of the bone marrow by prostate cancer.  For now continue, supportive transfusions.  #Left chin numbness-question involvement of the mental nerve.  STABLE.  # Sinus tachy; Aortic stenosis- defer to PCP/cards.   # Severe anemia- s/p transfusion; today- Hb-7.8 today; plan PRBC trasnfusion.  # Thrombocytopenia- 110-120s-metastatic disease/ xofigo.      #  X-geva today; every 2 weeks/ cbc-hold tube; follow up in 4 weeks/labs-hold tube/PSA;MD.

## 2017-05-21 NOTE — Progress Notes (Signed)
Interior OFFICE PROGRESS NOTE  Patient Care Team: Morayati, Lourdes Sledge, MD as PCP - General (Endocrinology)  Cancer Staging No matching staging information was found for the patient.   Oncology History   1. Carcinoma of prostate diagnosis- 2006.  Had received radiation therapy, external beam to prostate and pelvic lymph nodes.  Had Gleason 9 (4+5.)  PainBaseline PSA was 13. 2. Received Lupron injection after her radiation therapy also.  PSA was going up.  Last year or so received Lupron injection in September but did not have any followup.   3. Recently (this August, 2012) PSA was found to be 17. Patient was started on Lupron from September of 2012. 4. Patient has progressive disease by PSA criteria (October, 2013) 5. Patient was started on Zytiga and prednisone in November of 2013. 6.traumatic hip fracture ((right femur) status post internally fixed ,no evidence of metastatic disease(May of 2014) 7.bone scan 11/2015 (outside institution) shows metastases to left scapula and left 11th rib 8.progressive On ZYTIGA by PSA criteria.  9[name], September of 2015- Progression- Jan 2016 10. AUG 2017- Provenge 2 of planned 3 [sec to logistical issues] 11. SEP 2017- START DOCETAXEL weekly; CT C/A/P-Progressive bone mets [compared to 2016 CT] PSA PROGRESSION; NOV 2017 BONE SCAN STABLE 12. NOV 21st 2017-  CABAZITAXEL q 3 W; 05/02/2016-Added Carboplatin with cycle #3 [given rising PSA] 13. FEB 2018- GUARDIANT TESTING- AR-7/ no BRCA or targettable mutations.  14. # Anemia on aranesp/IV iron [end of may 2018] 15 June 13th 2018 XOFIGO #1 16. Completed 6 cycles Xofigo 03/13/17 17. Anemia 03/14/17- hmg 6.8 - 2u pRBCs.  18. MRI brain 03/22/17  # DEC 2018- BMBx- [severe anemia]-involvement of bone marrow by prostate cancer.     Prostate cancer metastatic to bone [facility])     INTERVAL HISTORY:  Preston Wilson 82 y.o.  male pleasant patient above history of Metastatic  prostate cancer status post multiple lines of therapy; most recently completed 6 cycles Xofigo-last treatment in November 2018.  He is here for follow-up.   Patient continues to have anemia-needing PRBC transfusion when less than 8.  The last transfusion was approximately 4 weeks ago.  He was also recently evaluated by [facility] oncology; recommended supportive care with transfusions.  Not a candidate for clinical trial at this time. He continues to have left-sided chin numbness which is not any worse.  He denies any headaches.  Denies any nausea vomiting.  Patient complains of mild shortness of breath on exertion.  Complains of more fatigue.  No chest pain.  No syncopal attacks.    REVIEW OF SYSTEMS:  A complete 10 point review of system is done which is negative except mentioned above/history of present illness.   PAST MEDICAL HISTORY :  Past Medical History:  Diagnosis Date  . Asthma   . Heart murmur   . Hyperlipidemia   . Hypocalcemia   . Osteomyelitis ([place])   . Prostate cancer ([place])   . SOB (shortness of breath)     PAST SURGICAL HISTORY :   Past Surgical History:  Procedure Laterality Date  . COLONOSCOPY WITH PROPOFOL N/A 04/12/2015   Procedure: COLONOSCOPY WITH PROPOFOL;  Surgeon: Lollie Sails, MD;  Location: [facility] ENDOSCOPY;  Service: Endoscopy;  Laterality: N/A;  . EXCISIONAL HEMORRHOIDECTOMY    . TONSILLECTOMY      FAMILY HISTORY :   Family History  Problem Relation Age of Onset  . Colon cancer Father        deceased  .  Prostate cancer Brother        deceased  . Other Child        neuroblastoma    SOCIAL HISTORY:   Social History   Tobacco Use  . Smoking status: Former Smoker    Packs/day: 0.50    Types: Cigarettes    Last attempt to quit: 04/10/2016    Years since quitting: 1.1  . Smokeless tobacco: Never Used  Substance Use Topics  . Alcohol use: No    Alcohol/week: 0.0 oz  . Drug use: No    ALLERGIES:  has No Known Allergies.  MEDICATIONS:  Current  Outpatient Medications  Medication Sig Dispense Refill  . ADVAIR DISKUS 100-50 MCG/DOSE AEPB inhale 1 dose by mouth twice a day  0  . calcium-vitamin D (OSCAL WITH D) 500-200 MG-UNIT per tablet Take 1 tablet by mouth daily with breakfast.     . ferrous sulfate 325 (65 FE) MG tablet Take 325 mg by mouth 2 (two) times daily with a meal.     . Leuprolide Acetate (LUPRON IJ) Inject 1 mL as directed every 3 (three) months.    . Multiple Vitamin (MULTIVITAMIN) tablet Take 1 tablet by mouth daily.    . ondansetron (ZOFRAN) 8 MG tablet 1 pill every 8 hours as needed for nausea/vomitting (Patient not taking: Reported on 04/10/2017) 40 tablet 1  . predniSONE (DELTASONE) 5 MG tablet Take 1 tablet (5 mg total) by mouth 2 (two) times daily with a meal. (Patient taking differently: Take 5 mg by mouth daily. ) 60 tablet 3  . Vitamin D, Ergocalciferol, (DRISDOL) 50000 UNITS CAPS capsule Take 50,000 Units by mouth once a week. Fridays  0   No current facility-administered medications for this visit.     PHYSICAL EXAMINATION: ECOG PERFORMANCE STATUS: 1 - Symptomatic but completely ambulatory  BP 121/69 (BP Location: Right Arm, Patient Position: Sitting)   Pulse (!) 123   Temp 97.6 F (36.4 C) (Tympanic)   Resp 20   Ht 6' (1.829 m)   Wt 165 lb 3.2 oz (74.9 kg)   SpO2 99%   BMI 22.41 kg/m   Filed Weights   05/21/17 1054  Weight: 165 lb 3.2 oz (74.9 kg)    Constitutional: elderly, frail-appearing, man accompanied by wife, alert, oriented. No acute distress.   HENT: Head: Normocephalic. Decreased hearing bilaterally chronic, poor dentition.No pallor or icterus. No thrush or ulceration. Neck: Normal range of motion. Neck supple. No JVD present.  Cardiovascular: Tachycardia present. Murmur heard. Systolic murmur is present with a grade of 3/6. No lower extremity edema Pulses: Carotid pulses are on the left side with bruit. No palpable thrill.  Lymphadenopathy: no palpable lymphadenopathy in the cervical,  axillary or inguinal regions Pulmonary/Chest: clear to auscultation and  No wheeze or crackles Abdominal: Soft. rounded abdomen soft, non-tender and normal bowel sounds Musculoskeletal: no cyanosis of digits and no clubbing  Neurological: alert & oriented x 3 with fluent speech. no focal motor/sensory deficits Skin: Skin is dry and intact. There is pallor. Psychiatric: Memory, affect and judgment normal.    LABORATORY DATA:  I have reviewed the data as listed    Component Value Date/Time   NA 133 (L) 05/21/2017 1020   NA 138 08/25/2014 1442   K 4.3 05/21/2017 1020   K 4.1 08/25/2014 1442   CL 97 (L) 05/21/2017 1020   CL 106 08/25/2014 1442   CO2 24 05/21/2017 1020   CO2 25 08/25/2014 1442   GLUCOSE 119 (H) 05/21/2017  1020   GLUCOSE 138 (H) 08/25/2014 1442   BUN 21 (H) 05/21/2017 1020   BUN 19 08/25/2014 1442   CREATININE 0.63 05/21/2017 1020   CREATININE 0.53 (L) 08/25/2014 1442   CALCIUM 8.8 (L) 05/21/2017 1020   CALCIUM 9.0 08/25/2014 1442   PROT 7.3 05/21/2017 1020   PROT 6.8 08/25/2014 1442   ALBUMIN 3.8 05/21/2017 1020   ALBUMIN 3.9 08/25/2014 1442   AST 29 05/21/2017 1020   AST 18 08/25/2014 1442   ALT 13 (L) 05/21/2017 1020   ALT 13 (L) 08/25/2014 1442   ALKPHOS 103 05/21/2017 1020   ALKPHOS 73 08/25/2014 1442   BILITOT 0.8 05/21/2017 1020   BILITOT 0.7 08/25/2014 1442   GFRNONAA >60 05/21/2017 1020   GFRNONAA >60 08/25/2014 1442   GFRAA >60 05/21/2017 1020   GFRAA >60 08/25/2014 1442    No results found for: SPEP, UPEP  Lab Results  Component Value Date   WBC 2.8 (L) 05/21/2017   NEUTROABS 1.7 05/21/2017   HGB 7.8 (L) 05/21/2017   HCT 22.6 (L) 05/21/2017   MCV 99.2 05/21/2017   PLT 118 (L) 05/21/2017      Chemistry      Component Value Date/Time   NA 133 (L) 05/21/2017 1020   NA 138 08/25/2014 1442   K 4.3 05/21/2017 1020   K 4.1 08/25/2014 1442   CL 97 (L) 05/21/2017 1020   CL 106 08/25/2014 1442   CO2 24 05/21/2017 1020   CO2 25  08/25/2014 1442   BUN 21 (H) 05/21/2017 1020   BUN 19 08/25/2014 1442   CREATININE 0.63 05/21/2017 1020   CREATININE 0.53 (L) 08/25/2014 1442      Component Value Date/Time   CALCIUM 8.8 (L) 05/21/2017 1020   CALCIUM 9.0 08/25/2014 1442   ALKPHOS 103 05/21/2017 1020   ALKPHOS 73 08/25/2014 1442   AST 29 05/21/2017 1020   AST 18 08/25/2014 1442   ALT 13 (L) 05/21/2017 1020   ALT 13 (L) 08/25/2014 1442   BILITOT 0.8 05/21/2017 1020   BILITOT 0.7 08/25/2014 1442      IMPRESSION: 1. Progressive sclerosis of previously existing widespread osseous metastatic disease. Increase in sclerosis in this fashion is nonspecific but certainly can be seen in setting of response to therapy. 2. Questionable fracture of the left sacral ala, nondisplaced, correlate with any tenderness along the left posterior sacrum. 3. No adenopathy or prostate gland enlargement. 4. Contrast in the distal esophagus potentially from dysmotility or reflux. 5. Lumbar spondylosis and degenerative disc disease with impingement at L5-S 1. 6.  Aortic Atherosclerosis (ICD10-I70.0).  Electronically Signed   By: Van Clines M.D.   On: 01/29/2017 12:50 --------------------------------------------------------------------     IMPRESSION: Persistent widespread bony metastatic disease without appreciable change from prior studies. Renal uptake is noted bilaterally.  Electronically Signed   By: Lowella Grip III M.D.   On: 01/29/2017 14:40 --------------------------------------------------------------------  Results for Preston Wilson, Preston Wilson (MRN [id number]) as of 04/23/2017 10:45  Ref. Range 11/29/2016 08:12 12/27/2016 10:04 02/07/2017 09:00 03/07/2017 11:03 04/11/2017 10:22  Prostatic Specific Antigen Latest Ref Range: 0.00 - 4.00 ng/mL 1,911.00 (H) 2,109.00 (H) 1,346.00 (H) 1,959.00 (H) 2,826.00 (H)    RADIOGRAPHIC STUDIES: I have personally reviewed the radiological images as listed and agreed  with the findings in the report. No results found.    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 S/p Xofigo number  12 March 2017. Lupron Q 3 months [04/11/2017]  # Most recent PSA November 2018-progression. CT chest and pelvis September 2018-no obvious visceral disease; stable/progressive bone metastases; but PSA rising.    # Given his multiple lines of therapy-no standard options available.  Not a candidate for clinical trials-given his severe anemia s/p recent evaluation with Dr[facility]MCnamara at [facility].  Reviewed the note from [place].   # Again discussed with patient and wife that unfortunately prognosis is poor; I doubt if patient will be a candidate for future clinical trials given his ongoing anemia/involvement of the bone marrow by prostate cancer.  For now continue, supportive transfusions.  #Left chin numbness-question involvement of the mental nerve.  STABLE.  # Sinus tachy; Aortic stenosis- defer to PCP/cards.   # Severe anemia- s/p transfusion; today- Hb-7.8 today; plan PRBC trasnfusion.  # Thrombocytopenia- 110-120s-metastatic disease/ xofigo.      #  X-geva today; every 2 weeks/ cbc-hold tube; follow up in 4 weeks/labs-hold tube/PSA;MD.   Orders Placed This Encounter  Procedures  . CBC with Differential/Platelet    Standing Status:   Future    Standing Expiration Date:   05/21/2018  . Comprehensive metabolic panel    Standing Status:   Future    Standing Expiration Date:   05/21/2018  . PSA    Standing Status:   Future    Standing Expiration Date:   05/21/2018  . CBC with Differential    Standing Status:   Future    Standing Expiration Date:   05/21/2018  . Hold Tube- Blood Bank    Standing Status:   Standing    Number of Occurrences:   10    Standing Expiration Date:   05/21/2018  . Hold Tube- Blood Bank    Standing Status:   Future    Standing Expiration Date:    05/21/2018  . Hold Tube- Blood Bank    Standing Status:   Future    Standing Expiration Date:   05/21/2018     Cammie Sickle, MD 05/21/2017 1:41 PM

## 2017-05-22 ENCOUNTER — Inpatient Hospital Stay: Payer: Medicare Other

## 2017-05-22 DIAGNOSIS — C7951 Secondary malignant neoplasm of bone: Principal | ICD-10-CM

## 2017-05-22 DIAGNOSIS — D6481 Anemia due to antineoplastic chemotherapy: Secondary | ICD-10-CM

## 2017-05-22 DIAGNOSIS — T451X5A Adverse effect of antineoplastic and immunosuppressive drugs, initial encounter: Secondary | ICD-10-CM

## 2017-05-22 DIAGNOSIS — C61 Malignant neoplasm of prostate: Secondary | ICD-10-CM

## 2017-05-22 MED ORDER — SODIUM CHLORIDE 0.9 % IV SOLN
250.0000 mL | Freq: Once | INTRAVENOUS | Status: AC
  Administered 2017-05-22: 250 mL via INTRAVENOUS
  Filled 2017-05-22: qty 250

## 2017-05-22 MED ORDER — DIPHENHYDRAMINE HCL 25 MG PO CAPS
25.0000 mg | ORAL_CAPSULE | Freq: Once | ORAL | Status: AC
  Administered 2017-05-22: 25 mg via ORAL
  Filled 2017-05-22: qty 1

## 2017-05-22 MED ORDER — ACETAMINOPHEN 325 MG PO TABS
650.0000 mg | ORAL_TABLET | Freq: Once | ORAL | Status: AC
  Administered 2017-05-22: 650 mg via ORAL
  Filled 2017-05-22: qty 2

## 2017-05-23 LAB — TYPE AND SCREEN
ABO/RH(D): O POS
Antibody Screen: NEGATIVE
Unit division: 0

## 2017-05-23 LAB — BPAM RBC
Blood Product Expiration Date: 201902032359
ISSUE DATE / TIME: 201901160940
Unit Type and Rh: 5100

## 2017-05-29 ENCOUNTER — Other Ambulatory Visit: Payer: Self-pay | Admitting: Internal Medicine

## 2017-05-29 DIAGNOSIS — C7951 Secondary malignant neoplasm of bone: Principal | ICD-10-CM

## 2017-05-29 DIAGNOSIS — C61 Malignant neoplasm of prostate: Secondary | ICD-10-CM

## 2017-06-04 ENCOUNTER — Inpatient Hospital Stay: Payer: Medicare Other

## 2017-06-04 ENCOUNTER — Other Ambulatory Visit: Payer: Self-pay | Admitting: *Deleted

## 2017-06-04 DIAGNOSIS — C7951 Secondary malignant neoplasm of bone: Secondary | ICD-10-CM | POA: Diagnosis not present

## 2017-06-04 DIAGNOSIS — D649 Anemia, unspecified: Secondary | ICD-10-CM

## 2017-06-04 DIAGNOSIS — C61 Malignant neoplasm of prostate: Secondary | ICD-10-CM

## 2017-06-04 DIAGNOSIS — D6481 Anemia due to antineoplastic chemotherapy: Secondary | ICD-10-CM

## 2017-06-04 DIAGNOSIS — T451X5A Adverse effect of antineoplastic and immunosuppressive drugs, initial encounter: Secondary | ICD-10-CM

## 2017-06-04 LAB — PREPARE RBC (CROSSMATCH)

## 2017-06-04 LAB — CBC WITH DIFFERENTIAL/PLATELET
BASOS ABS: 0 10*3/uL (ref 0–0.1)
Basophils Relative: 0 %
Eosinophils Absolute: 0 10*3/uL (ref 0–0.7)
Eosinophils Relative: 0 %
HEMATOCRIT: 18.3 % — AB (ref 40.0–52.0)
HEMOGLOBIN: 6.2 g/dL — AB (ref 13.0–18.0)
LYMPHS PCT: 29 %
Lymphs Abs: 0.9 10*3/uL — ABNORMAL LOW (ref 1.0–3.6)
MCH: 34.1 pg — ABNORMAL HIGH (ref 26.0–34.0)
MCHC: 33.9 g/dL (ref 32.0–36.0)
MCV: 100.6 fL — AB (ref 80.0–100.0)
MONO ABS: 0.3 10*3/uL (ref 0.2–1.0)
MONOS PCT: 9 %
NEUTROS ABS: 1.9 10*3/uL (ref 1.4–6.5)
NEUTROS PCT: 62 %
Platelets: 101 10*3/uL — ABNORMAL LOW (ref 150–440)
RBC: 1.81 MIL/uL — ABNORMAL LOW (ref 4.40–5.90)
RDW: 18.2 % — AB (ref 11.5–14.5)
WBC: 3.1 10*3/uL — ABNORMAL LOW (ref 3.8–10.6)

## 2017-06-04 LAB — SAMPLE TO BLOOD BANK

## 2017-06-04 MED ORDER — SODIUM CHLORIDE 0.9 % IV SOLN
250.0000 mL | Freq: Once | INTRAVENOUS | Status: AC
  Administered 2017-06-04: 250 mL via INTRAVENOUS
  Filled 2017-06-04: qty 250

## 2017-06-04 MED ORDER — ACETAMINOPHEN 325 MG PO TABS
650.0000 mg | ORAL_TABLET | Freq: Once | ORAL | Status: AC
  Administered 2017-06-04: 650 mg via ORAL
  Filled 2017-06-04: qty 2

## 2017-06-04 MED ORDER — DIPHENHYDRAMINE HCL 25 MG PO CAPS
25.0000 mg | ORAL_CAPSULE | Freq: Once | ORAL | Status: AC
  Administered 2017-06-04: 25 mg via ORAL
  Filled 2017-06-04: qty 1

## 2017-06-05 LAB — TYPE AND SCREEN
ABO/RH(D): O POS
ANTIBODY SCREEN: NEGATIVE
UNIT DIVISION: 0
Unit division: 0

## 2017-06-05 LAB — BPAM RBC
BLOOD PRODUCT EXPIRATION DATE: 201902192359
Blood Product Expiration Date: 201902192359
ISSUE DATE / TIME: 201901291114
ISSUE DATE / TIME: 201901291318
UNIT TYPE AND RH: 5100
Unit Type and Rh: 5100

## 2017-06-06 ENCOUNTER — Telehealth: Payer: Self-pay | Admitting: *Deleted

## 2017-06-06 NOTE — Telephone Encounter (Signed)
Wife called from Dr Barkley Boards office reporting that he is not feeling any better and that he is on O2 in doctor office, States they are being told that he either needs to come here or go to ER stating his heart is not getting enough oxygen and he may need fluids.  I discussed with Vickki Muff RN who advises that patient needs to go to ER. I returned call to wife and had to leave voice mail for her to take him to the ER.

## 2017-06-10 ENCOUNTER — Inpatient Hospital Stay
Admission: EM | Admit: 2017-06-10 | Discharge: 2017-07-05 | DRG: 871 | Disposition: E | Payer: Medicare Other | Attending: Internal Medicine | Admitting: Internal Medicine

## 2017-06-10 ENCOUNTER — Emergency Department: Payer: Medicare Other

## 2017-06-10 ENCOUNTER — Other Ambulatory Visit: Payer: Self-pay

## 2017-06-10 ENCOUNTER — Inpatient Hospital Stay: Payer: Medicare Other

## 2017-06-10 ENCOUNTER — Encounter: Payer: Self-pay | Admitting: Intensive Care

## 2017-06-10 DIAGNOSIS — C61 Malignant neoplasm of prostate: Secondary | ICD-10-CM | POA: Diagnosis present

## 2017-06-10 DIAGNOSIS — J9811 Atelectasis: Secondary | ICD-10-CM | POA: Diagnosis present

## 2017-06-10 DIAGNOSIS — R34 Anuria and oliguria: Secondary | ICD-10-CM | POA: Diagnosis present

## 2017-06-10 DIAGNOSIS — Z7952 Long term (current) use of systemic steroids: Secondary | ICD-10-CM

## 2017-06-10 DIAGNOSIS — R748 Abnormal levels of other serum enzymes: Secondary | ICD-10-CM | POA: Diagnosis present

## 2017-06-10 DIAGNOSIS — Z79899 Other long term (current) drug therapy: Secondary | ICD-10-CM

## 2017-06-10 DIAGNOSIS — J90 Pleural effusion, not elsewhere classified: Secondary | ICD-10-CM | POA: Diagnosis present

## 2017-06-10 DIAGNOSIS — Z9911 Dependence on respirator [ventilator] status: Secondary | ICD-10-CM

## 2017-06-10 DIAGNOSIS — C7951 Secondary malignant neoplasm of bone: Secondary | ICD-10-CM | POA: Diagnosis present

## 2017-06-10 DIAGNOSIS — D638 Anemia in other chronic diseases classified elsewhere: Secondary | ICD-10-CM | POA: Diagnosis present

## 2017-06-10 DIAGNOSIS — Z87891 Personal history of nicotine dependence: Secondary | ICD-10-CM

## 2017-06-10 DIAGNOSIS — J189 Pneumonia, unspecified organism: Secondary | ICD-10-CM | POA: Diagnosis present

## 2017-06-10 DIAGNOSIS — R6521 Severe sepsis with septic shock: Secondary | ICD-10-CM | POA: Diagnosis present

## 2017-06-10 DIAGNOSIS — J45901 Unspecified asthma with (acute) exacerbation: Secondary | ICD-10-CM | POA: Diagnosis present

## 2017-06-10 DIAGNOSIS — A419 Sepsis, unspecified organism: Principal | ICD-10-CM | POA: Diagnosis present

## 2017-06-10 DIAGNOSIS — E785 Hyperlipidemia, unspecified: Secondary | ICD-10-CM | POA: Diagnosis present

## 2017-06-10 DIAGNOSIS — N289 Disorder of kidney and ureter, unspecified: Secondary | ICD-10-CM

## 2017-06-10 DIAGNOSIS — I4581 Long QT syndrome: Secondary | ICD-10-CM | POA: Diagnosis present

## 2017-06-10 DIAGNOSIS — R778 Other specified abnormalities of plasma proteins: Secondary | ICD-10-CM

## 2017-06-10 DIAGNOSIS — J440 Chronic obstructive pulmonary disease with acute lower respiratory infection: Secondary | ICD-10-CM | POA: Diagnosis present

## 2017-06-10 DIAGNOSIS — Z9889 Other specified postprocedural states: Secondary | ICD-10-CM

## 2017-06-10 DIAGNOSIS — E871 Hypo-osmolality and hyponatremia: Secondary | ICD-10-CM | POA: Diagnosis present

## 2017-06-10 DIAGNOSIS — J9601 Acute respiratory failure with hypoxia: Secondary | ICD-10-CM | POA: Diagnosis present

## 2017-06-10 DIAGNOSIS — R7989 Other specified abnormal findings of blood chemistry: Secondary | ICD-10-CM

## 2017-06-10 DIAGNOSIS — R0602 Shortness of breath: Secondary | ICD-10-CM

## 2017-06-10 DIAGNOSIS — E872 Acidosis: Secondary | ICD-10-CM | POA: Diagnosis present

## 2017-06-10 DIAGNOSIS — D61818 Other pancytopenia: Secondary | ICD-10-CM | POA: Diagnosis present

## 2017-06-10 DIAGNOSIS — R0603 Acute respiratory distress: Secondary | ICD-10-CM | POA: Diagnosis present

## 2017-06-10 DIAGNOSIS — I350 Nonrheumatic aortic (valve) stenosis: Secondary | ICD-10-CM | POA: Diagnosis present

## 2017-06-10 DIAGNOSIS — J181 Lobar pneumonia, unspecified organism: Secondary | ICD-10-CM

## 2017-06-10 DIAGNOSIS — J9690 Respiratory failure, unspecified, unspecified whether with hypoxia or hypercapnia: Secondary | ICD-10-CM | POA: Diagnosis present

## 2017-06-10 LAB — CBC WITH DIFFERENTIAL/PLATELET
BASOS PCT: 0 %
Basophils Absolute: 0 10*3/uL (ref 0–0.1)
EOS ABS: 0 10*3/uL (ref 0–0.7)
Eosinophils Relative: 0 %
HEMATOCRIT: 20.1 % — AB (ref 40.0–52.0)
HEMOGLOBIN: 6.8 g/dL — AB (ref 13.0–18.0)
Lymphocytes Relative: 19 %
Lymphs Abs: 0.5 10*3/uL — ABNORMAL LOW (ref 1.0–3.6)
MCH: 32.2 pg (ref 26.0–34.0)
MCHC: 33.9 g/dL (ref 32.0–36.0)
MCV: 95.2 fL (ref 80.0–100.0)
MONO ABS: 0.3 10*3/uL (ref 0.2–1.0)
MONOS PCT: 12 %
NEUTROS PCT: 69 %
Neutro Abs: 1.9 10*3/uL (ref 1.4–6.5)
Platelets: 97 10*3/uL — ABNORMAL LOW (ref 150–440)
RBC: 2.11 MIL/uL — ABNORMAL LOW (ref 4.40–5.90)
RDW: 19.8 % — ABNORMAL HIGH (ref 11.5–14.5)
WBC: 2.8 10*3/uL — ABNORMAL LOW (ref 3.8–10.6)

## 2017-06-10 LAB — COMPREHENSIVE METABOLIC PANEL
ALK PHOS: 112 U/L (ref 38–126)
ALT: 22 U/L (ref 17–63)
ANION GAP: 12 (ref 5–15)
AST: 42 U/L — ABNORMAL HIGH (ref 15–41)
Albumin: 3.5 g/dL (ref 3.5–5.0)
BILIRUBIN TOTAL: 0.5 mg/dL (ref 0.3–1.2)
BUN: 25 mg/dL — ABNORMAL HIGH (ref 6–20)
CALCIUM: 8.9 mg/dL (ref 8.9–10.3)
CO2: 21 mmol/L — ABNORMAL LOW (ref 22–32)
Chloride: 98 mmol/L — ABNORMAL LOW (ref 101–111)
Creatinine, Ser: 0.8 mg/dL (ref 0.61–1.24)
GFR calc non Af Amer: >60 mL/min (ref >60)
Glucose, Bld: 140 mg/dL — ABNORMAL HIGH (ref 65–99)
POTASSIUM: 4.8 mmol/L (ref 3.5–5.1)
Sodium: 131 mmol/L — ABNORMAL LOW (ref 135–145)
Total Protein: 6.7 g/dL (ref 6.5–8.1)

## 2017-06-10 LAB — BLOOD GAS, ARTERIAL
ACID-BASE DEFICIT: 15.5 mmol/L — AB (ref 0.0–2.0)
Acid-base deficit: 7.3 mmol/L — ABNORMAL HIGH (ref 0.0–2.0)
Bicarbonate: 15.2 mmol/L — ABNORMAL LOW (ref 20.0–28.0)
Bicarbonate: 19.7 mmol/L — ABNORMAL LOW (ref 20.0–28.0)
FIO2: 1
FIO2: 1
LHR: 16 {breaths}/min
MECHVT: 500 mL
Mechanical Rate: 23
O2 SAT: 98.4 %
O2 Saturation: 99.9 %
PCO2 ART: 55 mmHg — AB (ref 32.0–48.0)
PEEP: 5 cmH2O
PEEP: 5 cmH2O
Patient temperature: 37
Patient temperature: 37
VT: 500 mL
pCO2 arterial: 45 mmHg (ref 32.0–48.0)
pH, Arterial: 7.05 — CL (ref 7.350–7.450)
pH, Arterial: 7.25 — ABNORMAL LOW (ref 7.350–7.450)
pO2, Arterial: 153 mmHg — ABNORMAL HIGH (ref 83.0–108.0)
pO2, Arterial: 367 mmHg — ABNORMAL HIGH (ref 83.0–108.0)

## 2017-06-10 LAB — PROCALCITONIN

## 2017-06-10 LAB — GLUCOSE, CAPILLARY
GLUCOSE-CAPILLARY: 319 mg/dL — AB (ref 65–99)
GLUCOSE-CAPILLARY: 349 mg/dL — AB (ref 65–99)

## 2017-06-10 LAB — LACTIC ACID, PLASMA
LACTIC ACID, VENOUS: 4 mmol/L — AB (ref 0.5–1.9)
Lactic Acid, Venous: 1.9 mmol/L (ref 0.5–1.9)

## 2017-06-10 LAB — MRSA PCR SCREENING: MRSA BY PCR: NEGATIVE

## 2017-06-10 LAB — INFLUENZA PANEL BY PCR (TYPE A & B)
INFLAPCR: NEGATIVE
INFLBPCR: NEGATIVE

## 2017-06-10 LAB — TROPONIN I: Troponin I: 0.25 ng/mL (ref <0.03)

## 2017-06-10 LAB — OSMOLALITY: Osmolality: 294 mOsm/kg (ref 275–295)

## 2017-06-10 LAB — PREPARE RBC (CROSSMATCH)

## 2017-06-10 MED ORDER — CHLORHEXIDINE GLUCONATE 0.12% ORAL RINSE (MEDLINE KIT)
15.0000 mL | Freq: Two times a day (BID) | OROMUCOSAL | Status: DC
  Administered 2017-06-10 – 2017-06-11 (×2): 15 mL via OROMUCOSAL

## 2017-06-10 MED ORDER — MORPHINE SULFATE (PF) 2 MG/ML IV SOLN
INTRAVENOUS | Status: AC
  Filled 2017-06-10: qty 1

## 2017-06-10 MED ORDER — METHYLPREDNISOLONE SODIUM SUCC 125 MG IJ SOLR
60.0000 mg | INTRAMUSCULAR | Status: DC
  Administered 2017-06-10 – 2017-06-11 (×2): 60 mg via INTRAVENOUS
  Filled 2017-06-10 (×2): qty 2

## 2017-06-10 MED ORDER — SENNOSIDES-DOCUSATE SODIUM 8.6-50 MG PO TABS
1.0000 | ORAL_TABLET | Freq: Every evening | ORAL | Status: DC | PRN
  Filled 2017-06-10: qty 1

## 2017-06-10 MED ORDER — MIDAZOLAM HCL 2 MG/2ML IJ SOLN
INTRAMUSCULAR | Status: AC
  Administered 2017-06-10: 2 mg
  Filled 2017-06-10: qty 2

## 2017-06-10 MED ORDER — ACETAMINOPHEN 650 MG RE SUPP: 650.0000 mg | Freq: Four times a day (QID) | RECTAL | Status: DC | PRN

## 2017-06-10 MED ORDER — MAGNESIUM SULFATE 2 GM/50ML IV SOLN
2.0000 g | Freq: Once | INTRAVENOUS | Status: AC
  Administered 2017-06-10: 2 g via INTRAVENOUS
  Filled 2017-06-10: qty 50

## 2017-06-10 MED ORDER — IOPAMIDOL (ISOVUE-370) INJECTION 76%
75.0000 mL | Freq: Once | INTRAVENOUS | Status: AC | PRN
  Administered 2017-06-10: 75 mL via INTRAVENOUS

## 2017-06-10 MED ORDER — CALCIUM CARBONATE-VITAMIN D 500-200 MG-UNIT PO TABS: 1.0000 | ORAL_TABLET | Freq: Every day | ORAL | Status: DC

## 2017-06-10 MED ORDER — MORPHINE SULFATE (PF) 2 MG/ML IV SOLN: 2.0000 mg | INTRAVENOUS | Status: DC | PRN

## 2017-06-10 MED ORDER — SUCCINYLCHOLINE CHLORIDE 20 MG/ML IJ SOLN
160.0000 mg | Freq: Once | INTRAMUSCULAR | Status: AC
  Administered 2017-06-10: 160 mg via INTRAVENOUS

## 2017-06-10 MED ORDER — ONDANSETRON HCL 4 MG PO TABS: 4.0000 mg | ORAL_TABLET | Freq: Four times a day (QID) | ORAL | Status: DC | PRN

## 2017-06-10 MED ORDER — MOMETASONE FURO-FORMOTEROL FUM 100-5 MCG/ACT IN AERO
2.0000 | INHALATION_SPRAY | Freq: Two times a day (BID) | RESPIRATORY_TRACT | Status: DC
  Filled 2017-06-10: qty 8.8

## 2017-06-10 MED ORDER — SODIUM CHLORIDE 0.9 % IV BOLUS (SEPSIS)
1000.0000 mL | Freq: Once | INTRAVENOUS | Status: AC
  Administered 2017-06-10: 1000 mL via INTRAVENOUS

## 2017-06-10 MED ORDER — FENTANYL CITRATE (PF) 100 MCG/2ML IJ SOLN
INTRAMUSCULAR | Status: AC
  Filled 2017-06-10: qty 2

## 2017-06-10 MED ORDER — DEXTROSE 5 % IV SOLN
1.0000 g | Freq: Once | INTRAVENOUS | Status: AC
  Administered 2017-06-10: 1 g via INTRAVENOUS
  Filled 2017-06-10: qty 10

## 2017-06-10 MED ORDER — ADULT MULTIVITAMIN W/MINERALS CH: 1.0000 | ORAL_TABLET | Freq: Every day | ORAL | Status: DC

## 2017-06-10 MED ORDER — ETOMIDATE 2 MG/ML IV SOLN
20.0000 mg | Freq: Once | INTRAVENOUS | Status: AC
  Administered 2017-06-10: 20 mg via INTRAVENOUS

## 2017-06-10 MED ORDER — FENTANYL 2500MCG IN NS 250ML (10MCG/ML) PREMIX INFUSION
INTRAVENOUS | Status: AC
  Filled 2017-06-10: qty 250

## 2017-06-10 MED ORDER — MIDAZOLAM HCL 2 MG/2ML IJ SOLN
2.0000 mg | INTRAMUSCULAR | Status: DC | PRN
  Administered 2017-06-10 – 2017-06-11 (×6): 2 mg via INTRAVENOUS
  Filled 2017-06-10 (×6): qty 2

## 2017-06-10 MED ORDER — TRAMADOL HCL 50 MG PO TABS: 50.0000 mg | ORAL_TABLET | Freq: Four times a day (QID) | ORAL | Status: DC | PRN

## 2017-06-10 MED ORDER — DENOSUMAB 120 MG/1.7ML ~~LOC~~ SOLN: 120.0000 mg | SUBCUTANEOUS | Status: DC

## 2017-06-10 MED ORDER — IPRATROPIUM-ALBUTEROL 0.5-2.5 (3) MG/3ML IN SOLN
3.0000 mL | Freq: Once | RESPIRATORY_TRACT | Status: AC
  Administered 2017-06-10: 3 mL via RESPIRATORY_TRACT
  Filled 2017-06-10: qty 3

## 2017-06-10 MED ORDER — MIDAZOLAM HCL 5 MG/5ML IJ SOLN
2.0000 mg | Freq: Once | INTRAMUSCULAR | Status: AC
  Administered 2017-06-10: 2 mg via INTRAVENOUS
  Filled 2017-06-10: qty 5

## 2017-06-10 MED ORDER — FERROUS SULFATE 325 (65 FE) MG PO TABS: 325.0000 mg | ORAL_TABLET | Freq: Two times a day (BID) | ORAL | Status: DC

## 2017-06-10 MED ORDER — MIDAZOLAM HCL 5 MG/5ML IJ SOLN
3.0000 mg | Freq: Once | INTRAMUSCULAR | Status: AC
  Administered 2017-06-10: 3 mg via INTRAVENOUS

## 2017-06-10 MED ORDER — VANCOMYCIN HCL IN DEXTROSE 1-5 GM/200ML-% IV SOLN
1000.0000 mg | Freq: Once | INTRAVENOUS | Status: AC
  Administered 2017-06-10: 1000 mg via INTRAVENOUS
  Filled 2017-06-10: qty 200

## 2017-06-10 MED ORDER — VANCOMYCIN HCL IN DEXTROSE 1-5 GM/200ML-% IV SOLN
1000.0000 mg | Freq: Two times a day (BID) | INTRAVENOUS | Status: DC
  Administered 2017-06-11: 1000 mg via INTRAVENOUS
  Filled 2017-06-10 (×3): qty 200

## 2017-06-10 MED ORDER — FENTANYL CITRATE (PF) 100 MCG/2ML IJ SOLN
50.0000 ug | Freq: Once | INTRAMUSCULAR | Status: AC
  Administered 2017-06-10: 50 ug via INTRAVENOUS
  Filled 2017-06-10: qty 2

## 2017-06-10 MED ORDER — ENOXAPARIN SODIUM 40 MG/0.4ML ~~LOC~~ SOLN
40.0000 mg | SUBCUTANEOUS | Status: DC
  Administered 2017-06-10: 40 mg via SUBCUTANEOUS
  Filled 2017-06-10: qty 0.4

## 2017-06-10 MED ORDER — SODIUM CHLORIDE 0.9 % IV SOLN: Freq: Once | INTRAVENOUS | Status: DC

## 2017-06-10 MED ORDER — IPRATROPIUM-ALBUTEROL 0.5-2.5 (3) MG/3ML IN SOLN
3.0000 mL | RESPIRATORY_TRACT | Status: DC
  Administered 2017-06-10 – 2017-06-11 (×6): 3 mL via RESPIRATORY_TRACT
  Filled 2017-06-10 (×7): qty 3

## 2017-06-10 MED ORDER — SODIUM CHLORIDE 0.9 % IV SOLN: 10.0000 mL/h | Freq: Once | INTRAVENOUS | Status: DC

## 2017-06-10 MED ORDER — ORAL CARE MOUTH RINSE
15.0000 mL | OROMUCOSAL | Status: DC
  Administered 2017-06-10 – 2017-06-11 (×10): 15 mL via OROMUCOSAL

## 2017-06-10 MED ORDER — SODIUM CHLORIDE 0.9 % IV SOLN
INTRAVENOUS | Status: DC
  Administered 2017-06-10: 1000 mL via INTRAVENOUS
  Administered 2017-06-10: 75 mL/h via INTRAVENOUS
  Administered 2017-06-11 (×2): via INTRAVENOUS

## 2017-06-10 MED ORDER — FENTANYL CITRATE (PF) 100 MCG/2ML IJ SOLN
50.0000 ug | Freq: Once | INTRAMUSCULAR | Status: AC
  Administered 2017-06-10: 50 ug via INTRAVENOUS

## 2017-06-10 MED ORDER — ACETAMINOPHEN 325 MG PO TABS: 650.0000 mg | ORAL_TABLET | Freq: Four times a day (QID) | ORAL | Status: DC | PRN

## 2017-06-10 MED ORDER — BISACODYL 10 MG RE SUPP: 10.0000 mg | Freq: Every day | RECTAL | Status: DC | PRN

## 2017-06-10 MED ORDER — MIDAZOLAM HCL 2 MG/2ML IJ SOLN
2.0000 mg | Freq: Once | INTRAMUSCULAR | Status: DC
  Filled 2017-06-10: qty 2

## 2017-06-10 MED ORDER — ONDANSETRON HCL 4 MG/2ML IJ SOLN: 4.0000 mg | Freq: Four times a day (QID) | INTRAMUSCULAR | Status: DC | PRN

## 2017-06-10 MED ORDER — PIPERACILLIN-TAZOBACTAM 3.375 G IVPB
3.3750 g | Freq: Three times a day (TID) | INTRAVENOUS | Status: DC
  Administered 2017-06-10 – 2017-06-11 (×3): 3.375 g via INTRAVENOUS
  Filled 2017-06-10 (×3): qty 50

## 2017-06-10 MED ORDER — DEXTROSE 5 % IV SOLN
500.0000 mg | Freq: Once | INTRAVENOUS | Status: AC
  Administered 2017-06-10: 500 mg via INTRAVENOUS
  Filled 2017-06-10: qty 500

## 2017-06-10 MED ORDER — FENTANYL 2500MCG IN NS 250ML (10MCG/ML) PREMIX INFUSION
0.0000 ug/h | INTRAVENOUS | Status: DC
  Administered 2017-06-10: 100 ug/h via INTRAVENOUS
  Administered 2017-06-11: 200 ug/h via INTRAVENOUS
  Administered 2017-06-11: 250 ug/h via INTRAVENOUS
  Filled 2017-06-10 (×2): qty 250

## 2017-06-10 NOTE — ED Notes (Addendum)
Pt continues to take BIPAP off. EDP, wife, RT at bedside. Pt pale, diaphoretic.   Pt requesting water. Explained that he cannot due to BIPAP.   Wife agrees to intubation.

## 2017-06-10 NOTE — ED Provider Notes (Addendum)
Upon returning from CT scan, it was noted by the patient's wife that he was short of breath.  Walking and he was not hypoxic, however it was noted that his oxygen tubing had been disconnected.  I reattached in the report he started to feel better.  Shortly thereafter, while working to transfer out of critical patient, nursing notified me that the patient had increasing shortness of breath not long after being evaluated by the hospitalist (Dr. Benjie Karvonen)  Upon my arrival to the room the patient was sitting upright, tripoding diaphoretic and notably dyspneic.  A cold form we immediately started the patient on BiPAP.  This improved his work of breathing somewhat but he continued to have evidence of increased work of breathing, he did not tolerate the BiPAP mask well and there is removing it himself asking for water, seemingly developing some confusion as well.  On reexam, patient was noted to have rales throughout bilateral lung fields.  Suspect likely developing flash pulmonary edema.  Stopped saline infusion, stop active infusions.  Decision was made by myself and Dr. Jefferson Fuel (ICU) who is at the bedside at the time of decision and discussion with the patient's wife, we will proceed with intubation.  Patient and his wife both the noted to me that if her breathing tube could be temporary or later removed that he would be okay with this.  Decision to move forward with intubation.  The patient had notable increased work of breathing cannot tolerate BiPAP.  Procedure Name: Intubation Date/Time: 06/30/2017 4:39 PM Performed by: Delman Kitten, MD Pre-anesthesia Checklist: Patient identified, Patient being monitored, Emergency Drugs available, Timeout performed and Suction available Oxygen Delivery Method: Non-rebreather mask Preoxygenation: Pre-oxygenation with 100% oxygen Induction Type: Rapid sequence Ventilation: Mask ventilation without difficulty Laryngoscope Size: Glidescope and 3 Grade View: Grade I Tube  size: 7.5 mm Number of attempts: 1 Airway Equipment and Method: Video-laryngoscopy Placement Confirmation: ETT inserted through vocal cords under direct vision,  CO2 detector,  Breath sounds checked- equal and bilateral and Positive ETCO2 Secured at: 20 cm Tube secured with: ETT holder Dental Injury: Teeth and Oropharynx as per pre-operative assessment      ----------------------------------------- 4:40 PM on 06/29/2017 -----------------------------------------  Patient seen, now under the care of the ICU physician Dr. Jefferson Fuel.  Patient's wife updated.  I have ordered a small dose of Versed and fentanyl pushes at this time, continue to monitor the patient carefully for sedation needs.  Patient now under the care of the hospitalist service. Dr. Benjie Karvonen updated on change in intubation status.    Delman Kitten, MD 07/02/2017 1642   CRITICAL CARE Performed by: Delman Kitten   Total critical care time: 35 minutes  Critical care time was exclusive of separately billable procedures and treating other patients.  Critical care was necessary to treat or prevent imminent or life-threatening deterioration.  Critical care was time spent personally by me on the following activities: development of treatment plan with patient and/or surrogate as well as nursing, discussions with consultants, evaluation of patient's response to treatment, examination of patient, obtaining history from patient or surrogate, ordering and performing treatments and interventions, ordering and review of laboratory studies, ordering and review of radiographic studies, pulse oximetry and re-evaluation of patient's condition.     Delman Kitten, MD 07/02/2017 [phone]

## 2017-06-10 NOTE — ED Provider Notes (Addendum)
[facility] Emergency Department Provider Note    None    (approximate)  I have reviewed the triage vital signs and the nursing notes.   HISTORY  Chief Complaint Shortness of Breath    HPI Preston Wilson is a 82 y.o. male with a history of prostate cancer with metastasis as well as chronic shortness of breath not on home oxygen and COPD on antineoplastic chemotherapy with no recent hospitalizations presents to the ER with 2 weeks of productive and 24 hours of worsening shortness of breath only take 1 or 2 steps including rest.  Denies any measured fevers or chills.  No nausea or vomiting.  No blood and no hemoptysis.  Denies any chest pain or pressure.  Does feel very fatigued and worn down.   Past Medical History:  Diagnosis Date  . Asthma   . Heart murmur   . Hyperlipidemia   . Hypocalcemia   . Osteomyelitis ([place])   . Prostate cancer ([place])   . SOB (shortness of breath)    Family History  Problem Relation Age of Onset  . Colon cancer Father        deceased  . Prostate cancer Brother        deceased  . Other Child        neuroblastoma   Past Surgical History:  Procedure Laterality Date  . COLONOSCOPY WITH PROPOFOL N/A 04/12/2015   Procedure: COLONOSCOPY WITH PROPOFOL;  Surgeon: Lollie Sails, MD;  Location: [facility] ENDOSCOPY;  Service: Endoscopy;  Laterality: N/A;  . EXCISIONAL HEMORRHOIDECTOMY    . TONSILLECTOMY     Patient Active Problem List   Diagnosis Date Noted  . Anemia due to antineoplastic chemotherapy 06/04/2016  . Weight loss, abnormal 10/19/2015  . Prostate cancer metastatic to bone ([place]) 10/19/2015  . Bone metastases ([place]) 10/19/2015  . Aortic heart valve narrowing 03/02/2015  . Cardiac murmur 02/22/2015  . Combined fat and carbohydrate induced hyperlipemia 02/22/2015  . Breathlessness on exertion 02/22/2015      Prior to Admission medications   Medication Sig Start Date End Date Taking? Authorizing  Provider  ADVAIR DISKUS 100-50 MCG/DOSE AEPB inhale 1 dose by mouth twice a day 02/14/15  Yes [provider]  calcium-vitamin D (OSCAL WITH D) 500-200 MG-UNIT per tablet Take 1 tablet by mouth daily with breakfast.    Yes [provider]  Denosumab (XGEVA [employer]) Inject 1 Dose into the skin every 30 (thirty) minutes.   Yes [provider]  ferrous sulfate 325 (65 FE) MG tablet Take 325 mg by mouth 2 (two) times daily with a meal.    Yes [provider]  Leuprolide Acetate (LUPRON IJ) Inject 1 mL as directed every 3 (three) months.   Yes [provider]  Multiple Vitamin (MULTIVITAMIN) tablet Take 1 tablet by mouth daily.   Yes [provider]  predniSONE (DELTASONE) 5 MG tablet take 1 tablet by mouth twice a day with food 05/31/17  Yes Cammie Sickle, MD  ondansetron (ZOFRAN) 8 MG tablet 1 pill every 8 hours as needed for nausea/vomitting Patient not taking: Reported on 04/10/2017 01/20/16   Cammie Sickle, MD    Allergies Patient has no known allergies.    Social History Social History   Tobacco Use  . Smoking status: Former Smoker    Packs/day: 0.50    Types: Cigarettes    Last attempt to quit: 04/10/2016    Years since quitting: 1.1  . Smokeless tobacco: Never  Used  Substance Use Topics  . Alcohol use: No    Alcohol/week: 0.0 oz  . Drug use: No    Review of Systems Patient denies headaches, rhinorrhea, blurry vision, numbness, shortness of breath, chest pain, edema, cough, abdominal pain, nausea, vomiting, diarrhea, dysuria, fevers, rashes or hallucinations unless otherwise stated above in HPI. ____________________________________________   PHYSICAL EXAM:  VITAL SIGNS: Vitals:   06/20/2017 1430 06/19/2017 1500  BP: 94/61 102/64  Pulse: (!) 123 (!) 125  Resp: 18 17  Temp:    SpO2: 98% 100%    Constitutional: Alert and oriented.frail and ill appearing Eyes: Conjunctivae are normal.  Head: Atraumatic. Nose:  No congestion/rhinnorhea. Mouth/Throat: Mucous membranes are moist.   Neck: No stridor. Painless ROM.  Cardiovascular: tachycardic rate, regular rhythm. Holosystolic murmur.  Good peripheral circulation. Respiratory: diminished breathsounds throughout, mild tachypnea Gastrointestinal: Soft and nontender. No distention. No abdominal bruits. No CVA tenderness. Genitourinary:  Musculoskeletal: No lower extremity tenderness nor edema.  No joint effusions. Neurologic:  Normal speech and language. No gross focal neurologic deficits are appreciated. No facial droop Skin:  Skin is warm, dry and intact. No rash noted. Psychiatric: Mood and affect are normal. Speech and behavior are normal.  ____________________________________________   LABS (all labs ordered are listed, but only abnormal results are displayed)  Results for orders placed or performed during the hospital encounter of 06/15/2017 (from the past 24 hour(s))  Troponin I     Status: Abnormal   Collection Time: 06/23/2017  2:24 PM  Result Value Ref Range   Troponin I 0.25 (HH) <0.03 ng/mL  CBC with Differential/Platelet     Status: Abnormal   Collection Time: 06/18/2017  2:24 PM  Result Value Ref Range   WBC 2.8 (L) 3.8 - 10.6 K/uL   RBC 2.11 (L) 4.40 - 5.90 MIL/uL   Hemoglobin 6.8 (L) 13.0 - 18.0 g/dL   HCT 20.1 (L) 40.0 - 52.0 %   MCV 95.2 80.0 - 100.0 fL   MCH 32.2 26.0 - 34.0 pg   MCHC 33.9 32.0 - 36.0 g/dL   RDW 19.8 (H) 11.5 - 14.5 %   Platelets 97 (L) 150 - 440 K/uL   Neutrophils Relative % 69 %   Neutro Abs 1.9 1.4 - 6.5 K/uL   Lymphocytes Relative 19 %   Lymphs Abs 0.5 (L) 1.0 - 3.6 K/uL   Monocytes Relative 12 %   Monocytes Absolute 0.3 0.2 - 1.0 K/uL   Eosinophils Relative 0 %   Eosinophils Absolute 0.0 0 - 0.7 K/uL   Basophils Relative 0 %   Basophils Absolute 0.0 0 - 0.1 K/uL  Comprehensive metabolic panel     Status: Abnormal   Collection Time: 06/08/2017  2:24 PM  Result Value Ref Range   Sodium 131 (L) 135 -  145 mmol/L   Potassium 4.8 3.5 - 5.1 mmol/L   Chloride 98 (L) 101 - 111 mmol/L   CO2 21 (L) 22 - 32 mmol/L   Glucose, Bld 140 (H) 65 - 99 mg/dL   BUN 25 (H) 6 - 20 mg/dL   Creatinine, Ser 0.80 0.61 - 1.24 mg/dL   Calcium 8.9 8.9 - 10.3 mg/dL   Total Protein 6.7 6.5 - 8.1 g/dL   Albumin 3.5 3.5 - 5.0 g/dL   AST 42 (H) 15 - 41 U/L   ALT 22 17 - 63 U/L   Alkaline Phosphatase 112 38 - 126 U/L   Total Bilirubin 0.5 0.3 - 1.2 mg/dL   GFR  calc non Af Amer >60 >60 mL/min   GFR calc Af Amer >60 >60 mL/min   Anion gap 12 5 - 15  Type and screen [facility]     Status: None (Preliminary result)   Collection Time: 06/16/2017  2:24 PM  Result Value Ref Range   ABO/RH(D) O POS    Antibody Screen NEG    Sample Expiration      06/13/2017 Performed at [facility], [address], [place], [place] [address]    Unit Number [id number]    Blood Component Type RBC LR PHER1    Unit division 00    Status of Unit ALLOCATED    Transfusion Status OK TO TRANSFUSE    Crossmatch Result Compatible   Prepare RBC     Status: None   Collection Time: 06/09/2017  4:00 PM  Result Value Ref Range   Order Confirmation      ORDER PROCESSED BY BLOOD BANK Performed at [facility], [address] [place], [place] [address]    ____________________________________________  EKG My review and personal interpretation at Time: 14:15   Indication: sob  Rate: 130  Rhythm: sinus Axis: normal Other: nonsepcific st changes likely rate depndent.  borderling prolonged QT ____________________________________________  RADIOLOGY  I personally reviewed all radiographic images ordered to evaluate for the above acute complaints and reviewed radiology reports and findings.  These findings were personally discussed with the patient.  Please see medical record for radiology report.  ____________________________________________   PROCEDURES  Procedure(s) performed:  .Critical  Care Performed by: Merlyn Lot, MD Authorized by: Merlyn Lot, MD   Critical care provider statement:    Critical care time (minutes):  30   Critical care time was exclusive of:  Separately billable procedures and treating other patients   Critical care was necessary to treat or prevent imminent or life-threatening deterioration of the following conditions:  Sepsis and respiratory failure   Critical care was time spent personally by me on the following activities:  Development of treatment plan with patient or surrogate, discussions with consultants, evaluation of patient's response to treatment, examination of patient, obtaining history from patient or surrogate, ordering and performing treatments and interventions, ordering and review of laboratory studies, ordering and review of radiographic studies, pulse oximetry, re-evaluation of patient's condition and review of old charts      Critical Care performed: yes ____________________________________________   INITIAL IMPRESSION / ASSESSMENT AND PLAN / ED COURSE  Pertinent labs & imaging results that were available during my care of the patient were reviewed by me and considered in my medical decision making (see chart for details).  DDX: Asthma, copd, CHF, pna, ptx, malignancy, Pe, anemia   Preston Wilson is a 81 y.o. who presents to the ED with his as described above.  Patient is very frail and ill-appearing.  Currently protecting his airway.  Blood work sent for the above differential.  EKG shows tach at cardia with nonspecific ST changes likely rate dependent.  He is afebrile describes productive cough concerning for pneumonia.  Patient has active malignancy therefore CT scan ordered to evaluate for PE.  Chest x-ray showed possible consolidation reviewed the CT scan it does appear that he likely has some component of postobstructive pneumonia effusion which would explain his symptoms.  Patient also with  symptomatic anemia hemoglobin is 6.8 after having received transfusion therefore will recommend additional transfusion here as this could be driving much of his symptoms.  Will start on broad-spectrum  antibiotics for pneumonia.  Patient with elevation to 0.25.  Most likely secondary to demand ischemia in setting of sepsis and underlying anemia.   Patient will require admission to the hospital.  Have discussed with the patient and available family all diagnostics and treatments performed thus far and all questions were answered to the best of my ability. The patient demonstrates understanding and agreement with plan.      ____________________________________________   FINAL CLINICAL IMPRESSION(S) / ED DIAGNOSES  Final diagnoses:  Acute respiratory distress  Pneumonia of left lower lobe due to infectious organism (HCC)  Elevated troponin      NEW MEDICATIONS STARTED DURING THIS VISIT:  New Prescriptions   No medications on file     Note:  This document was prepared using Dragon voice recognition software and may include unintentional dictation errors.    Merlyn Lot, MD 06/27/2017 1538    Merlyn Lot, MD 06/22/2017 [phone]

## 2017-06-10 NOTE — ED Notes (Addendum)
20mg  etomidate, 160mg  succinylcholine verbal orders from Dr. Jacqualine Code.

## 2017-06-10 NOTE — ED Notes (Signed)
Date and time results received: 06/14/2017 1545 (use smartphrase ".now" to insert current time)  Test: troponin  Critical Value: 0.25  Name of Provider Notified: Dr. Delman Kitten   Orders Received? Or Actions Taken

## 2017-06-10 NOTE — ED Notes (Signed)
Pt is now back from CT. Pt appears comfortable, skin continues to be pale and dry. Pt able to talk in full sentences.

## 2017-06-10 NOTE — ED Notes (Signed)
Spoke with Agricultural consultant, Water quality scientist regarding blood transfusion. Paged Dr. Benjie Karvonen to get clarification about whether or not to give blood in ER.

## 2017-06-10 NOTE — ED Notes (Signed)
Dr. Benjie Karvonen at bedside to assess patient.

## 2017-06-10 NOTE — ED Notes (Addendum)
Pt attempting to bite tube, kicking legs. EDP informed.   Dr. Benjie Karvonen paged.

## 2017-06-10 NOTE — ED Notes (Signed)
Per Dr. Merlyn Lot, patient to receive type specific blood and to to receive in ER.

## 2017-06-10 NOTE — ED Notes (Signed)
Pt taken to floor by Marya Amsler, RN and RT.  Report to Leroy Sea, RN on CCU

## 2017-06-10 NOTE — ED Notes (Addendum)
Reporting yellow sputum, SOB and productive cough X 2 weeks. Worsening overnight. Wheezing present. Denies CP. Pt pale. Hx of CA, receives blood transfusions every 4 weeks. Alert and oriented X4.  Pt placed on 2L [place] for comfort. Oxygen 98% on RA.

## 2017-06-10 NOTE — ED Notes (Addendum)
Spoke with Dr. Bettey Costa in person. States that patient can receive blood in ER if it is ready but that it is not necessary. Bed ready, will attempt to call report to facilitate patient to unit

## 2017-06-10 NOTE — Consult Note (Addendum)
[facility] Consultation    ASSESSMENT/PLAN   Respiratory failure. Discussed with patient's wife is states that he would want to be briefly intubated, clarified the issue of CPR, patient will not be resuscitated if his heart were to stop. She is agreeable if he needs central access and pressors will proceed with intubation, place on broad-spectrum antibiotic coverage, albuterol, Atrovent, Solu-Medrol, respiratory culture data, mechanical ventilation protocol  Pancytopenia. Patient has been transfusion dependent, will transfuse packed red blood cell and for hemoglobin less than 7  Positive troponin. Cycle cardiac enzymes. EKG reveals an ossific ST-T wave changes, ectopic atrial focus and tachycardia without clear STEMI  Lactic acidosis. Lactic acid is 4.0, will recycle  Hyponatremia. Will provide saline resuscitation, will also send spot urinary sodium and urinary osm, serum osmolality  Prerenal azotemia. Elevated BUN/creatinine ratio. Will Pl., Foley make sure there is no post obstructive uropathy and obtain renal ultrasound  Discussed CODE STATUS with wife. Patient is intubated. Will make no CPR if patient were to arrest  Critical care time 45 minutes   Name: Preston Wilson MRN: [id number] DOB: Jul 29, 1935    ADMISSION DATE:  06/18/2017 CONSULTATION DATE: 07/04/2017  REFERRING MD :  Dr. Benjie Karvonen  CHIEF COMPLAINT:   Shortness of breath   HISTORY OF PRESENT ILLNESS:  Patient is an 82 year old gentleman with a past medical history remarkable for hyperlipidemia, asthma, prostate cancer, metastatic, castrate resistant, chronic anemia and transfusion dependent who presented to the emergency department with complaints of shortness of breath. His wife states that he has had shortness of breath over 2 week period of time with increasing sputum production. It was also noted to have low-grade fever, chills. He recently received 2 units of packed red blood cells at the  cancer center secondary to hemoglobin of 6.2. Per chart there are no additional options for treatment of his prostate cancer secondary to his anemia and thrombus cytopenia. In emergency department patient was given azithromycin and Rocephin, subsequently developed acute progressive shortness of breath. When I arrived in the emergency room he was in distress with an off his BiPAP mask desaturating into the 70s. We proceeded with intubation after clarifying his wishes with his wife.  PAST MEDICAL HISTORY :  Past Medical History:  Diagnosis Date  . Asthma   . Heart murmur   . Hyperlipidemia   . Hypocalcemia   . Osteomyelitis ([place])   . Prostate cancer ([place])   . SOB (shortness of breath)    Past Surgical History:  Procedure Laterality Date  . COLONOSCOPY WITH PROPOFOL N/A 04/12/2015   Procedure: COLONOSCOPY WITH PROPOFOL;  Surgeon: Lollie Sails, MD;  Location: [facility] ENDOSCOPY;  Service: Endoscopy;  Laterality: N/A;  . EXCISIONAL HEMORRHOIDECTOMY    . TONSILLECTOMY     Prior to Admission medications   Medication Sig Start Date End Date Taking? Authorizing Provider  ADVAIR DISKUS 100-50 MCG/DOSE AEPB inhale 1 dose by mouth twice a day 02/14/15  Yes [provider]  calcium-vitamin D (OSCAL WITH D) 500-200 MG-UNIT per tablet Take 1 tablet by mouth daily with breakfast.    Yes [provider]  Denosumab (XGEVA [place]) Inject 1 Dose into the skin every 30 (thirty) minutes.   Yes [provider]  ferrous sulfate 325 (65 FE) MG tablet Take 325 mg by mouth 2 (two) times daily with a meal.    Yes [provider]  Leuprolide Acetate (LUPRON IJ) Inject 1 mL as directed every 3 (three) months.   Yes  [provider]  Multiple Vitamin (MULTIVITAMIN) tablet Take 1 tablet by mouth daily.   Yes [provider]  predniSONE (DELTASONE) 5 MG tablet take 1 tablet by mouth twice a day with food 05/31/17  Yes Cammie Sickle, MD  ondansetron (ZOFRAN) 8 MG  tablet 1 pill every 8 hours as needed for nausea/vomitting Patient not taking: Reported on 04/10/2017 01/20/16   Cammie Sickle, MD   No Known Allergies  FAMILY HISTORY:  Family History  Problem Relation Age of Onset  . Colon cancer Father        deceased  . Prostate cancer Brother        deceased  . Other Child        neuroblastoma   SOCIAL HISTORY:  reports that he quit smoking about 14 months ago. His smoking use included cigarettes. He smoked 0.50 packs per day. he has never used smokeless tobacco. He reports that he does not drink alcohol or use drugs.  REVIEW OF SYSTEMS:   Patient unable to provide history secondary to respiratory distress   VITAL SIGNS: Temp:  [97.4 F (36.3 C)] 97.4 F (36.3 C) (02/04 1405) Pulse Rate:  [123-145] 133 (02/04 1636) Resp:  [16-32] 22 (02/04 1636) BP: (83-133)/(61-95) 128/83 (02/04 1636) SpO2:  [97 %-100 %] 99 % (02/04 1636) Weight:  [165 lb (74.8 kg)] 165 lb (74.8 kg) (02/04 1407) HEMODYNAMICS:    Physical Examination:   VS: BP 128/83   Pulse (!) 133   Temp (!) 97.4 F (36.3 C) (Oral)   Resp (!) 22   Ht 6' (1.829 m)   Wt 165 lb (74.8 kg)   SpO2 99%   BMI 22.38 kg/m   General Appearance: Patient in severe respiratory distress, ripping off BiPAP mask, desaturating Neuro: Limited exam, patient moves all extremities HEENT: Patient is cyanotic with accessory muscle utilization, edentulous with upper plates, trachea is midline, no thyromegaly or jugular venous distention Pulmonary: Patient with prolonged expiratory phase, and territory crackles in both lung bases  Cardiovascular tachycardia, systolic murmur appreciated left parasternal border. Sinus mechanism noted on monitor  Abdomen: Limited exam secondary to respiratory distress, hypoactive bowel sounds noted. Skin:   warm, no rashes, no ecchymosis  Extremities: cyanosis noted, no clubbing, no edema, warm with normal capillary refill.    LABS: Reviewed   LABORATORY  PANEL:   CBC Recent Labs  Lab 07/01/2017 1424  WBC 2.8*  HGB 6.8*  HCT 20.1*  PLT 97*    Chemistries  Recent Labs  Lab 06/26/2017 1424  NA 131*  K 4.8  CL 98*  CO2 21*  GLUCOSE 140*  BUN 25*  CREATININE 0.80  CALCIUM 8.9  AST 42*  ALT 22  ALKPHOS 112  BILITOT 0.5    No results for input(s): GLUCAP in the last 168 hours. No results for input(s): PHART, PCO2ART, PO2ART in the last 168 hours. Recent Labs  Lab 06/17/2017 1424  AST 42*  ALT 22  ALKPHOS 112  BILITOT 0.5  ALBUMIN 3.5    Cardiac Enzymes Recent Labs  Lab 06/12/2017 1424  TROPONINI 0.25*    RADIOLOGY:  Ct Angio Chest Pe W And/or Wo Contrast  Result Date: 06/10/2017 CLINICAL DATA:  Shortness of breath. EXAM: CT ANGIOGRAPHY CHEST WITH CONTRAST TECHNIQUE: Multidetector CT imaging of the chest was performed using the standard protocol during bolus administration of intravenous contrast. Multiplanar CT image reconstructions and MIPs were obtained to evaluate the vascular anatomy. CONTRAST:  15mL ISOVUE-370 IOPAMIDOL (ISOVUE-370) INJECTION 76%  COMPARISON:  CT chest dated Sep 06, 2016. FINDINGS: Cardiovascular: Satisfactory opacification of the pulmonary arteries to the segmental level. No evidence of pulmonary embolism. Normal heart size. No pericardial effusion. Normal caliber thoracic aorta. Coronary, aortic arch, and branch vessel atherosclerotic vascular disease. Mediastinum/Nodes: New mildly enlarged left axillary lymph node, measuring up to 1.2 cm in short axis. New mildly enlarged prevascular lymph node, measuring 1.3 cm in short axis. No hilar lymphadenopathy. The thyroid gland, trachea, and esophagus demonstrate no significant abnormalities. Lungs/Pleura: Large right and small left pleural effusions. Right basilar subsegmental atelectasis. Compressive atelectasis of the left lower lobe. No consolidation or pneumothorax. No suspicious pulmonary nodule. Upper Abdomen: No acute abnormality. New borderline enlarged  left cardiophrenic lymph node. Musculoskeletal: Diffuse sclerotic metastatic disease throughout the visible bony thorax is unchanged. No fracture. Mild gynecomastia. Review of the MIP images confirms the above findings. IMPRESSION: 1. No evidence of pulmonary embolism. 2. Large left and small right pleural effusions. Resultant compressive atelectasis of the left lower lobe. 3. New mildly enlarged prevascular and left axillary lymph nodes. Other axillary lymph nodes have increased in size when compared to prior study, but remain non-enlarged by CT size criteria. Findings are concerning for new nodal metastases. 4. Diffuse sclerotic metastatic disease throughout the visible bony thorax, not significantly changed. 5.  Aortic atherosclerosis (ICD10-I70.0). Electronically Signed   By: Titus Dubin M.D.   On: 06/26/2017 15:46   Dg Chest Portable 1 View  Result Date: 07/01/2017 CLINICAL DATA:  Shortness of breath. Evaluate for CHF. History of metastatic prostate cancer. EXAM: PORTABLE CHEST 1 VIEW COMPARISON:  CT chest, abdomen and pelvis dated Sep 06, 2016. FINDINGS: The heart size and mediastinal contours are within normal limits. Normal pulmonary vascularity. Left basilar opacity with adjacent small left pleural effusion. Right basilar atelectasis. No pneumothorax. Diffuse sclerosis of the visualized bony thorax. IMPRESSION: 1. Left lower lobe consolidation, concerning for pneumonia. Adjacent small left pleural effusion. 2. Diffuse sclerosis of the visualized bony thorax, consistent with history of metastatic prostate cancer. Electronically Signed   By: Titus Dubin M.D.   On: 06/13/2017 14:38     Hermelinda Dellen, DO 06/10/2017, 4:40 PM

## 2017-06-10 NOTE — ED Notes (Addendum)
Pt wife calling out, states that patient cannot breathe. Pt diaphoretic, tachypneic. Placed on NRB. Pt appears to be choking on thick yellow sputum. Sat patient up straight in bed. Pt produces yellow thick sputum, suctioned. EDP called to bedside. RT called.  1608 Pt placed on BIPAP. RT at bedside.

## 2017-06-10 NOTE — ED Triage Notes (Addendum)
Patient reports he started having episodes of SOB around 2AM. HX COPD. Does not wear O2 at home. Patient was placed on 2L O2 during triage due to saying he could not breathe. O2 sats in triage 100%. Denies any pain. A&O x4. Current prostate cancer that has metastisized to bones

## 2017-06-10 NOTE — ED Notes (Signed)
Wife, Xabi Wittler signed consent for blood transfusion since patient intubated.

## 2017-06-10 NOTE — ED Notes (Signed)
Pt 98% on RA. Pt placed on 2L [place] when in room for comfort. Wife at bedside. Pt does not wear oxygen at home.

## 2017-06-10 NOTE — ED Notes (Signed)
Pt kicking and biting ET tube. EDP informed.

## 2017-06-10 NOTE — H&P (Addendum)
Preston Wilson NAME: Preston Wilson    MR#:  [id number]  DATE OF BIRTH:  1935/08/30  DATE OF ADMISSION:  06/20/2017  PRIMARY CARE PHYSICIAN: Lenard Simmer, MD   REQUESTING/REFERRING PHYSICIAN: dr Quentin Cornwall  CHIEF COMPLAINT:   sob HISTORY OF PRESENT ILLNESS:  Preston Wilson  is a 82 y.o. male with a known history of metastatic castrate resistant prostate cancer, anemia of chronic disease now transfusion dependent and COPD/asthma who presents today to the emergency room complaining shortness of breath. Patient reports over the past 2 weeks he has had increasing shortness of breath and sputum production. He has had low-grade fever and chills. He denies body aches. He denies nausea or vomiting. He received 2 units of pack red blood cells a few days ago at the cancer center when his hemoglobin was 6.2. He has completed treatment for his prostate cancer and is no longer a candidate for further therapy given his history of anemia and thrombocytopenia.  Chest x-ray showing pleural effusions and pneumonia. He has been started on Rocephin and azithromycin. Influenza testing is pending   Of note while doing this dictation, she had increased work of breathing and shortness of breath. He has bilateral rhonchi diffusely. He is working very hard to breathe. Case discussed with ER physician who will consider intubating patient. We will try Bipap for now. Case also discussed with intensivist.  Physical exam and review of systems on this H&P are consistent with pre-intubation.   PAST MEDICAL HISTORY:   Past Medical History:  Diagnosis Date  . Asthma   . Heart murmur   . Hyperlipidemia   . Hypocalcemia   . Osteomyelitis ([place])   . Prostate cancer ([place])   . SOB (shortness of breath)     PAST SURGICAL HISTORY:   Past Surgical History:  Procedure Laterality Date  . COLONOSCOPY WITH PROPOFOL N/A 04/12/2015   Procedure: COLONOSCOPY WITH  PROPOFOL;  Surgeon: Lollie Sails, MD;  Location: [facility] ENDOSCOPY;  Service: Endoscopy;  Laterality: N/A;  . EXCISIONAL HEMORRHOIDECTOMY    . TONSILLECTOMY      SOCIAL HISTORY:   Social History   Tobacco Use  . Smoking status: Former Smoker    Packs/day: 0.50    Types: Cigarettes    Last attempt to quit: 04/10/2016    Years since quitting: 1.1  . Smokeless tobacco: Never Used  Substance Use Topics  . Alcohol use: No    Alcohol/week: 0.0 oz    FAMILY HISTORY:   Family History  Problem Relation Age of Onset  . Colon cancer Father        deceased  . Prostate cancer Brother        deceased  . Other Child        neuroblastoma    DRUG ALLERGIES:  No Known Allergies  REVIEW OF SYSTEMS:   Review of Systems  Constitutional: Positive for fever and malaise/fatigue. Negative for chills.  HENT: Negative.  Negative for ear discharge, ear pain, hearing loss, nosebleeds and sore throat.   Eyes: Negative.  Negative for blurred vision and pain.  Respiratory: Positive for cough, sputum production and shortness of breath. Negative for hemoptysis and wheezing.   Cardiovascular: Negative.  Negative for chest pain, palpitations and leg swelling.  Gastrointestinal: Negative.  Negative for abdominal pain, blood in stool, diarrhea, nausea and vomiting.  Genitourinary: Negative.  Negative for dysuria.  Musculoskeletal: Negative.  Negative for back pain.  Skin: Negative.  Neurological: Positive for weakness. Negative for dizziness, tremors, speech change, focal weakness, seizures and headaches.  Endo/Heme/Allergies: Negative.  Does not bruise/bleed easily.  Psychiatric/Behavioral: Negative.  Negative for depression, hallucinations and suicidal ideas.    MEDICATIONS AT HOME:   Prior to Admission medications   Medication Sig Start Date End Date Taking? Authorizing Provider  ADVAIR DISKUS 100-50 MCG/DOSE AEPB inhale 1 dose by mouth twice a day 02/14/15  Yes [provider]   calcium-vitamin D (OSCAL WITH D) 500-200 MG-UNIT per tablet Take 1 tablet by mouth daily with breakfast.    Yes [provider]  Denosumab (XGEVA [place]) Inject 1 Dose into the skin every 30 (thirty) minutes.   Yes [provider]  ferrous sulfate 325 (65 FE) MG tablet Take 325 mg by mouth 2 (two) times daily with a meal.    Yes [provider]  Leuprolide Acetate (LUPRON IJ) Inject 1 mL as directed every 3 (three) months.   Yes [provider]  Multiple Vitamin (MULTIVITAMIN) tablet Take 1 tablet by mouth daily.   Yes [provider]  predniSONE (DELTASONE) 5 MG tablet take 1 tablet by mouth twice a day with food 05/31/17  Yes Cammie Sickle, MD  ondansetron (ZOFRAN) 8 MG tablet 1 pill every 8 hours as needed for nausea/vomitting Patient not taking: Reported on 04/10/2017 01/20/16   Cammie Sickle, MD      VITAL SIGNS:  Blood pressure 102/64, pulse (!) 125, temperature (!) 97.4 F (36.3 C), temperature source Oral, resp. rate 17, height 6' (1.829 m), weight 74.8 kg (165 lb), SpO2 100 %.  PHYSICAL EXAMINATION:   Physical Exam  Constitutional: He is oriented to person, place, and time. No distress.  Frail appearing  HENT:  Head: Normocephalic.  Eyes: No scleral icterus.  Neck: Normal range of motion. Neck supple. No JVD present. No tracheal deviation present.  Cardiovascular: Regular rhythm. Exam reveals no gallop and no friction rub.  Murmur heard. Tachycardia  Pulmonary/Chest: Effort normal. No respiratory distress. He has wheezes. He has no rales. He exhibits no tenderness.  Bilateral rhonchi with increased respiratory effort  Abdominal: Soft. Bowel sounds are normal. He exhibits no distension and no mass. There is no tenderness. There is no rebound and no guarding.  Musculoskeletal: Normal range of motion. He exhibits no edema.  Neurological: He is alert and oriented to person, place, and time.  Skin: Skin is warm. No rash  noted. No erythema.  Psychiatric: Affect and judgment normal.      LABORATORY PANEL:   CBC Recent Labs  Lab 06/28/2017 1424  WBC 2.8*  HGB 6.8*  HCT 20.1*  PLT 97*   ------------------------------------------------------------------------------------------------------------------  Chemistries  Recent Labs  Lab 06/18/2017 1424  NA 131*  K 4.8  CL 98*  CO2 21*  GLUCOSE 140*  BUN 25*  CREATININE 0.80  CALCIUM 8.9  AST 42*  ALT 22  ALKPHOS 112  BILITOT 0.5   ------------------------------------------------------------------------------------------------------------------  Cardiac Enzymes Recent Labs  Lab 06/15/2017 1424  TROPONINI 0.25*   ------------------------------------------------------------------------------------------------------------------  RADIOLOGY:  Ct Angio Chest Pe W And/or Wo Contrast  Result Date: 06/08/2017 CLINICAL DATA:  Shortness of breath. EXAM: CT ANGIOGRAPHY CHEST WITH CONTRAST TECHNIQUE: Multidetector CT imaging of the chest was performed using the standard protocol during bolus administration of intravenous contrast. Multiplanar CT image reconstructions and MIPs were obtained to evaluate the vascular anatomy. CONTRAST:  25mL ISOVUE-370 IOPAMIDOL (ISOVUE-370) INJECTION 76% COMPARISON:  CT chest dated Sep 06, 2016. FINDINGS: Cardiovascular: Satisfactory  opacification of the pulmonary arteries to the segmental level. No evidence of pulmonary embolism. Normal heart size. No pericardial effusion. Normal caliber thoracic aorta. Coronary, aortic arch, and branch vessel atherosclerotic vascular disease. Mediastinum/Nodes: New mildly enlarged left axillary lymph node, measuring up to 1.2 cm in short axis. New mildly enlarged prevascular lymph node, measuring 1.3 cm in short axis. No hilar lymphadenopathy. The thyroid gland, trachea, and esophagus demonstrate no significant abnormalities. Lungs/Pleura: Large right and small left pleural effusions. Right basilar  subsegmental atelectasis. Compressive atelectasis of the left lower lobe. No consolidation or pneumothorax. No suspicious pulmonary nodule. Upper Abdomen: No acute abnormality. New borderline enlarged left cardiophrenic lymph node. Musculoskeletal: Diffuse sclerotic metastatic disease throughout the visible bony thorax is unchanged. No fracture. Mild gynecomastia. Review of the MIP images confirms the above findings. IMPRESSION: 1. No evidence of pulmonary embolism. 2. Large left and small right pleural effusions. Resultant compressive atelectasis of the left lower lobe. 3. New mildly enlarged prevascular and left axillary lymph nodes. Other axillary lymph nodes have increased in size when compared to prior study, but remain non-enlarged by CT size criteria. Findings are concerning for new nodal metastases. 4. Diffuse sclerotic metastatic disease throughout the visible bony thorax, not significantly changed. 5.  Aortic atherosclerosis (ICD10-I70.0). Electronically Signed   By: Titus Dubin M.D.   On: 06/21/2017 15:46   Dg Chest Portable 1 View  Result Date: 06/09/2017 CLINICAL DATA:  Shortness of breath. Evaluate for CHF. History of metastatic prostate cancer. EXAM: PORTABLE CHEST 1 VIEW COMPARISON:  CT chest, abdomen and pelvis dated Sep 06, 2016. FINDINGS: The heart size and mediastinal contours are within normal limits. Normal pulmonary vascularity. Left basilar opacity with adjacent small left pleural effusion. Right basilar atelectasis. No pneumothorax. Diffuse sclerosis of the visualized bony thorax. IMPRESSION: 1. Left lower lobe consolidation, concerning for pneumonia. Adjacent small left pleural effusion. 2. Diffuse sclerosis of the visualized bony thorax, consistent with history of metastatic prostate cancer. Electronically Signed   By: Titus Dubin M.D.   On: 06/21/2017 14:38    EKG:   Sinus rhythm no ST elevation or depression  IMPRESSION AND PLAN:   82 year old male with metastatic  prostate cancer and anemia of chronic disease who is now transfusion dependent presents with shortness of breath.  1. Acute hypoxic respiratory failure in the setting of lower lobe consolidation and atelectasis with bilateral pleural effusion Patient on Bipap with low threshold for intubation. Case discussed with intensivist Further management as per intensivist  2. Pneumonia: Continue Zosyn Follow-up with influenza testing  3. Pleural effusion left greater than right: Patient may benefit from thoracentesis once stable  4. Metastatic prostate cancer with anemia of chronic disease, transfusion dependent Patient had consented for blood transfusion and therefore will receive 1 unit PRBC Oncology evaluation requested   5. History of COPD/asthma exacerbation: Start IV steroids Continue DuoNeb's     All the records are reviewed and case discussed with ED provider. Management plans discussed with the patient and wife and they are in agreement  CODE STATUS: FULL  Critical careTOTAL TIME TAKING CARE OF THIS PATIENT: 50 minutes.  Critically ill and high risk for intubation   Johnsie Moscoso M.D on 06/20/2017 at 4:01 PM  Between 7am to 6pm - Pager - [phone]  After 6pm go to [email or web address] - password [email or web address] [facility] [place]  Office  [phone]  CC: Primary care physician; Lenard Simmer, MD

## 2017-06-10 NOTE — Consult Note (Addendum)
Pharmacy Antibiotic Note  Preston Wilson is a 82 y.o. male admitted on 06/29/2017 with  pneumonia.  Pharmacy has been consulted for Zosyn and vancomycin dosing. Patient received ceftriaxone and azithromycin IV x 1 dose in ED.   Plan: Ke: 0.069  Vd: 52   T1/2: 10 Start vancomycin 1g IV every 12 hours with 6 hour stack dosing. Calculated trough at Css is 16.2. Trough level ordered prior to 4th dose. Will monitor renal function and adjust dose as needed.   Start Zosyn 3.375 IV EI every 8 hours.   Height: 6' (182.9 cm) Weight: 165 lb (74.8 kg) IBW/kg (Calculated) : 77.6  Temp (24hrs), Avg:97.4 F (36.3 C), Min:97.4 F (36.3 C), Max:97.4 F (36.3 C)  Recent Labs  Lab 06/04/17 0858 06/08/2017 1424  WBC 3.1* 2.8*  CREATININE  --  0.80    Estimated Creatinine Clearance: 77.9 mL/min (by C-G formula based on SCr of 0.8 mg/dL).    No Known Allergies  Antimicrobials this admission: 2/4 ceftriaxone/azithromycin >> x 1 dose 2/4 Zosyn >>  2/4 vancomycin   Dose adjustments this admission:  Microbiology results: 2/4 BCx: pending 2/4 MRSA PCR: pending   Thank you for allowing pharmacy to be a part of this patient's care.  Pernell Dupre, PharmD, BCPS Clinical Pharmacist 06/10/2017 4:19 PM

## 2017-06-11 ENCOUNTER — Other Ambulatory Visit: Payer: Self-pay

## 2017-06-11 ENCOUNTER — Inpatient Hospital Stay: Payer: Medicare Other

## 2017-06-11 ENCOUNTER — Telehealth: Payer: Self-pay | Admitting: Internal Medicine

## 2017-06-11 DIAGNOSIS — J9601 Acute respiratory failure with hypoxia: Secondary | ICD-10-CM

## 2017-06-11 LAB — BASIC METABOLIC PANEL
ANION GAP: 12 (ref 5–15)
Anion gap: 11 (ref 5–15)
BUN: 27 mg/dL — ABNORMAL HIGH (ref 6–20)
BUN: 32 mg/dL — AB (ref 6–20)
CHLORIDE: 103 mmol/L (ref 101–111)
CO2: 16 mmol/L — ABNORMAL LOW (ref 22–32)
CO2: 17 mmol/L — ABNORMAL LOW (ref 22–32)
CREATININE: 1.09 mg/dL (ref 0.61–1.24)
Calcium: 7 mg/dL — ABNORMAL LOW (ref 8.9–10.3)
Calcium: 7.4 mg/dL — ABNORMAL LOW (ref 8.9–10.3)
Chloride: 105 mmol/L (ref 101–111)
Creatinine, Ser: 1.04 mg/dL (ref 0.61–1.24)
GFR calc Af Amer: >60 mL/min (ref >60)
GLUCOSE: 156 mg/dL — AB (ref 65–99)
Glucose, Bld: 174 mg/dL — ABNORMAL HIGH (ref 65–99)
POTASSIUM: 5.5 mmol/L — AB (ref 3.5–5.1)
POTASSIUM: 5.9 mmol/L — AB (ref 3.5–5.1)
SODIUM: 133 mmol/L — AB (ref 135–145)
Sodium: 131 mmol/L — ABNORMAL LOW (ref 135–145)

## 2017-06-11 LAB — BLOOD GAS, ARTERIAL
Acid-base deficit: 9 mmol/L — ABNORMAL HIGH (ref 0.0–2.0)
Bicarbonate: 17.5 mmol/L — ABNORMAL LOW (ref 20.0–28.0)
FIO2: 0.5
LHR: 23 {breaths}/min
MECHVT: 500 mL
O2 Saturation: 98.3 %
PATIENT TEMPERATURE: 37
PEEP: 5 cmH2O
PO2 ART: 126 mmHg — AB (ref 83.0–108.0)
pCO2 arterial: 40 mmHg (ref 32.0–48.0)
pH, Arterial: 7.25 — ABNORMAL LOW (ref 7.350–7.450)

## 2017-06-11 LAB — TYPE AND SCREEN
ABO/RH(D): O POS
ANTIBODY SCREEN: NEGATIVE
Unit division: 0

## 2017-06-11 LAB — CBC
HEMATOCRIT: 23.7 % — AB (ref 40.0–52.0)
HEMATOCRIT: 24.5 % — AB (ref 40.0–52.0)
HEMOGLOBIN: 8.1 g/dL — AB (ref 13.0–18.0)
Hemoglobin: 7.9 g/dL — ABNORMAL LOW (ref 13.0–18.0)
MCH: 32.1 pg (ref 26.0–34.0)
MCH: 32.3 pg (ref 26.0–34.0)
MCHC: 33.2 g/dL (ref 32.0–36.0)
MCHC: 33.5 g/dL (ref 32.0–36.0)
MCV: 96.5 fL (ref 80.0–100.0)
MCV: 96.7 fL (ref 80.0–100.0)
PLATELETS: 92 10*3/uL — AB (ref 150–440)
Platelets: 100 10*3/uL — ABNORMAL LOW (ref 150–440)
RBC: 2.45 MIL/uL — ABNORMAL LOW (ref 4.40–5.90)
RBC: 2.54 MIL/uL — ABNORMAL LOW (ref 4.40–5.90)
RDW: 18.9 % — ABNORMAL HIGH (ref 11.5–14.5)
RDW: 19.1 % — AB (ref 11.5–14.5)
WBC: 3.3 10*3/uL — ABNORMAL LOW (ref 3.8–10.6)
WBC: 3.7 10*3/uL — AB (ref 3.8–10.6)

## 2017-06-11 LAB — PROTIME-INR
INR: 1.16
Prothrombin Time: 14.7 seconds (ref 11.4–15.2)

## 2017-06-11 LAB — APTT: APTT: 25 s (ref 24–36)

## 2017-06-11 LAB — GLUCOSE, CAPILLARY
GLUCOSE-CAPILLARY: 166 mg/dL — AB (ref 65–99)
GLUCOSE-CAPILLARY: 285 mg/dL — AB (ref 65–99)
Glucose-Capillary: 173 mg/dL — ABNORMAL HIGH (ref 65–99)

## 2017-06-11 LAB — BPAM RBC
BLOOD PRODUCT EXPIRATION DATE: 201903022359
ISSUE DATE / TIME: 201902041912
Unit Type and Rh: 5100

## 2017-06-11 LAB — LACTATE DEHYDROGENASE, PLEURAL OR PERITONEAL FLUID: LD FL: 67 U/L — AB (ref 3–23)

## 2017-06-11 LAB — BODY FLUID CELL COUNT WITH DIFFERENTIAL
Eos, Fluid: 0 %
Lymphs, Fluid: 47 %
Monocyte-Macrophage-Serous Fluid: 21 %
NEUTROPHIL FLUID: 29 %
Other Cells, Fluid: 3 %
WBC FLUID: 34 uL

## 2017-06-11 LAB — GLUCOSE, PLEURAL OR PERITONEAL FLUID: Glucose, Fluid: 197 mg/dL

## 2017-06-11 LAB — AMYLASE, PLEURAL OR PERITONEAL FLUID: AMYLASE FL: 17 U/L

## 2017-06-11 LAB — ALBUMIN, PLEURAL OR PERITONEAL FLUID: Albumin, Fluid: 1.1 g/dL

## 2017-06-11 LAB — TROPONIN I: Troponin I: 17.12 ng/mL (ref <0.03)

## 2017-06-11 LAB — SODIUM, URINE, RANDOM: Sodium, Ur: <10 mmol/L

## 2017-06-11 LAB — PROTEIN, PLEURAL OR PERITONEAL FLUID: Total protein, fluid: <3 g/dL

## 2017-06-11 LAB — PHOSPHORUS: PHOSPHORUS: 5.9 mg/dL — AB (ref 2.5–4.6)

## 2017-06-11 LAB — MAGNESIUM: Magnesium: 2.9 mg/dL — ABNORMAL HIGH (ref 1.7–2.4)

## 2017-06-11 LAB — OSMOLALITY, URINE: OSMOLALITY UR: 439 mosm/kg (ref 300–900)

## 2017-06-11 MED ORDER — DOCUSATE SODIUM 50 MG/5ML PO LIQD
100.0000 mg | Freq: Two times a day (BID) | ORAL | Status: DC
  Administered 2017-06-11: 100 mg
  Filled 2017-06-11: qty 10

## 2017-06-11 MED ORDER — HYDROCORTISONE NICU/PEDS ORAL SYRINGE 2 MG/ML: 50.0000 mg | Freq: Three times a day (TID) | ORAL | Status: DC

## 2017-06-11 MED ORDER — SODIUM CHLORIDE 0.9 % IV SOLN
0.0000 ug/min | Freq: Once | INTRAVENOUS | Status: AC
  Administered 2017-06-11: 20 ug/min via INTRAVENOUS
  Filled 2017-06-11: qty 10

## 2017-06-11 MED ORDER — SODIUM CHLORIDE 0.9 % IV BOLUS (SEPSIS)
500.0000 mL | Freq: Once | INTRAVENOUS | Status: AC
  Administered 2017-06-11: 500 mL via INTRAVENOUS

## 2017-06-11 MED ORDER — DEXTROSE 50 % IV SOLN
50.0000 mL | Freq: Once | INTRAVENOUS | Status: AC
  Administered 2017-06-11: 50 mL via INTRAVENOUS
  Filled 2017-06-11: qty 50

## 2017-06-11 MED ORDER — SODIUM POLYSTYRENE SULFONATE 15 GM/60ML PO SUSP
30.0000 g | Freq: Once | ORAL | Status: AC
  Administered 2017-06-11: 30 g via RECTAL
  Filled 2017-06-11: qty 120

## 2017-06-11 MED ORDER — SODIUM CHLORIDE 0.9 % IV SOLN
0.0000 ug/min | INTRAVENOUS | Status: DC
  Filled 2017-06-11: qty 1

## 2017-06-11 MED ORDER — CALCIUM CARBONATE-VITAMIN D 500-200 MG-UNIT PO TABS: 1.0000 | ORAL_TABLET | Freq: Every day | ORAL | Status: DC

## 2017-06-11 MED ORDER — FAMOTIDINE 40 MG/5ML PO SUSR
20.0000 mg | Freq: Every day | ORAL | Status: DC
  Administered 2017-06-11: 20 mg
  Filled 2017-06-11: qty 2.5

## 2017-06-11 MED ORDER — HYDROCORTISONE NA SUCCINATE PF 100 MG IJ SOLR: 50.0000 mg | Freq: Three times a day (TID) | INTRAMUSCULAR | Status: DC

## 2017-06-11 MED ORDER — PHENYLEPHRINE HCL 10 MG/ML IJ SOLN
0.0000 ug/min | Freq: Once | INTRAMUSCULAR | Status: DC
  Filled 2017-06-11: qty 1

## 2017-06-11 MED ORDER — ADULT MULTIVITAMIN W/MINERALS CH
1.0000 | ORAL_TABLET | Freq: Every day | ORAL | Status: DC
  Administered 2017-06-11: 1
  Filled 2017-06-11: qty 1

## 2017-06-11 MED ORDER — SENNOSIDES 8.8 MG/5ML PO SYRP
10.0000 mL | ORAL_SOLUTION | Freq: Two times a day (BID) | ORAL | Status: DC
  Administered 2017-06-11: 10 mL
  Filled 2017-06-11 (×2): qty 10

## 2017-06-11 MED ORDER — VASOPRESSIN 20 UNIT/ML IV SOLN
0.0300 [IU]/min | INTRAVENOUS | Status: DC
  Filled 2017-06-11: qty 2

## 2017-06-11 MED ORDER — SODIUM CHLORIDE 0.9 % IV SOLN
0.0000 ug/min | INTRAVENOUS | Status: DC
  Filled 2017-06-11: qty 4

## 2017-06-11 MED ORDER — SODIUM CHLORIDE 0.9 % IV SOLN
0.0000 ug/min | INTRAVENOUS | Status: DC
  Administered 2017-06-11: 40 ug/min via INTRAVENOUS
  Administered 2017-06-11: 35 ug/min via INTRAVENOUS
  Filled 2017-06-11: qty 10
  Filled 2017-06-11: qty 1

## 2017-06-11 MED ORDER — INSULIN REGULAR HUMAN 100 UNIT/ML IJ SOLN
10.0000 [IU] | Freq: Once | INTRAMUSCULAR | Status: AC
  Administered 2017-06-11: 10 [IU] via INTRAVENOUS
  Filled 2017-06-11 (×2): qty 0.1

## 2017-06-11 MED ORDER — SODIUM CHLORIDE 0.9 % IV SOLN
0.0000 ug/min | Freq: Once | INTRAVENOUS | Status: DC
  Filled 2017-06-11: qty 1

## 2017-06-12 LAB — CYTOLOGY - NON PAP

## 2017-06-12 LAB — PROTEIN, BODY FLUID (OTHER): Total Protein, Body Fluid Other: 1.8 g/dL

## 2017-06-13 ENCOUNTER — Telehealth: Payer: Self-pay

## 2017-06-13 LAB — PH, BODY FLUID: PH, BODY FLUID: 7.8

## 2017-06-13 NOTE — Telephone Encounter (Signed)
Death certificate placed in DK folder for completion.

## 2017-06-13 NOTE — Telephone Encounter (Signed)
Informed Preston Wilson that death cert is ready for pick up.

## 2017-06-13 NOTE — Telephone Encounter (Signed)
Received Death Certificate  Placed in nurse box

## 2017-06-15 LAB — CULTURE, BLOOD (ROUTINE X 2)
CULTURE: NO GROWTH
CULTURE: NO GROWTH
Special Requests: ADEQUATE

## 2017-06-15 LAB — BODY FLUID CULTURE: CULTURE: NO GROWTH

## 2017-06-19 ENCOUNTER — Other Ambulatory Visit: Payer: Medicare Other

## 2017-06-19 ENCOUNTER — Ambulatory Visit: Payer: Medicare Other | Admitting: Internal Medicine

## 2017-06-19 ENCOUNTER — Ambulatory Visit: Payer: Medicare Other

## 2017-07-05 NOTE — Progress Notes (Signed)
   07-05-2017 1105  Clinical Encounter Type  Visited With Patient and family together;Health care provider  Visit Type Initial   Chaplain visited with patient, spouse, and sister...health care providers in and out during exchange.  Chaplain offered emotional support, conversation around self-care.  Sister-in-law spoke of past memories related to hospital experience.  Patient's younger son joined conversation as patient spouse left.  Family open to ongoing chaplain support.

## 2017-07-05 NOTE — Care Management Note (Addendum)
Case Management Note  Patient Details  Name: Preston Wilson MRN: [id number] Date of Birth: August 26, 1935  Subjective/Objective:                 Patient admitted from home with pneumonia and pleural effusions.  Required intubation. Patient has prostate cancer with bone mets and is no longer a candidate for further treatment. Has been receiving blood transfusions for anemia.    Action/Plan:  Requested palliative consult Expected Discharge Date:                  Expected Discharge Plan:     In-House Referral:     Discharge planning Services     Post Acute Care Choice:    Choice offered to:     DME Arranged:    DME Agency:     HH Arranged:    HH Agency:     Status of Service:     If discussed at [employer] of Stay Meetings, dates discussed:    Additional Comments:  Katrina Stack, RN 2017/06/30, 9:04 AM

## 2017-07-05 NOTE — Progress Notes (Signed)
eLink Physician-Brief Progress Note Patient Name: Preston Wilson DOB: 23-Jun-1935 MRN: [id number]   Date of Service  Jul 07, 2017  HPI/Events of Note  Multiple issues: 1. K+ = 5.9 - NPO. NGT to LIS and 2. Oliguria -  No CVL or CVP. No recent cardiac echo.   eICU Interventions  Will order: 1. Kayexalate enema - 30 gm per rectum now.  2. Bolus with 0.9 NaCl 500 mL IV over 30 minutes now.      Intervention Category Major Interventions: Electrolyte abnormality - evaluation and management Intermediate Interventions: Oliguria - evaluation and management  [name] July 07, 2017, 2:04 AM

## 2017-07-05 NOTE — Progress Notes (Signed)
Lab called to notify of critical lab value Notified [facility] NP of Critical lab value: Troponin 17.12 Will await orders

## 2017-07-05 NOTE — Death Summary Note (Signed)
DEATH SUMMARY   Patient Details  Name: Preston Wilson MRN: [id number] DOB: Aug 16, 1935  Admission/Discharge Information   Admit Date:  06/15/17  Date of Death:  06-16-17  Time of Death:  August 07, 1957  Length of Stay: 1  Referring Physician: Lenard Simmer, MD   Reason(s) for Hospitalization  Acute Respiratory Failure   Diagnoses  Preliminary cause of death: Septic shock ([place]) Secondary Diagnoses (including complications and co-morbidities):  Active Problems:   Sepsis ([place])   Respiratory failure ([place])   Metastatic prostate cancer with anemia of chronic disease   Bilateral Pleural Effusions   Pneumonia   COPD   Asthma    Hyponatremia   Positive troponin's    Prerenal Azotemia    Pancytopenia secondary to metastatic prostate cancer   Brief Hospital Course (including significant findings, care, treatment, and services provided and events leading to death)  Preston Wilson is a 82 y.o. year old male who presented to [facility] ER 15-Jun-2017 with acute hypoxic respiratory failure secondary to multilobular pneumonia vs. pulmonary edema initially requiring Bipap, however due to worsening respiratory failure required mechanical intubation in the ER. He was admitted to ICU for further workup and treatment with iv antibiotics.  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  On June 15, 2017 pts code status changed by pts wife to limited code-no CPR, NO ACLS medications, and no defibrillation or cardioversion.  On admission pts troponin elevated at 17 without significant EKG changes and Cardiology consulted 2017-06-16.  Pt underwent right sided thoracentesis on 2017/06/16 with removal of 1.1 liters of yellow fluid.  On 2017/06/16 the pt became hypotensive requiring neo-synephrine and vasopressin drips.  However, despite aggressive treatment  the pt became asystole and pulseless on 06/16/17 with time of death at [place].     Pertinent Labs and Studies  Significant Diagnostic Studies Ct Angio Chest Pe W And/or Wo Contrast  Result Date: 06/15/17 CLINICAL DATA:  Shortness of breath. EXAM: CT ANGIOGRAPHY CHEST WITH CONTRAST TECHNIQUE: Multidetector CT imaging of the chest was performed using the standard protocol during bolus administration of intravenous contrast. Multiplanar CT image reconstructions and MIPs were obtained to evaluate the vascular anatomy. CONTRAST:  44mL ISOVUE-370 IOPAMIDOL (ISOVUE-370) INJECTION 76% COMPARISON:  CT chest dated Sep 06, 2016. FINDINGS: Cardiovascular: Satisfactory opacification of the pulmonary arteries to the segmental level. No evidence of pulmonary embolism. Normal heart size. No pericardial effusion. Normal caliber thoracic aorta. Coronary, aortic arch, and branch vessel atherosclerotic vascular disease. Mediastinum/Nodes: New mildly enlarged left axillary lymph node, measuring up to 1.2 cm in short axis. New mildly enlarged prevascular lymph node, measuring 1.3 cm in short axis. No hilar lymphadenopathy. The thyroid gland, trachea, and esophagus demonstrate no significant abnormalities. Lungs/Pleura: Large right and small left pleural effusions. Right basilar subsegmental atelectasis. Compressive atelectasis of the left lower lobe. No consolidation or pneumothorax. No suspicious pulmonary nodule. Upper Abdomen: No acute abnormality. New borderline enlarged left cardiophrenic lymph node. Musculoskeletal: Diffuse sclerotic metastatic disease throughout the visible bony thorax is unchanged. No fracture. Mild gynecomastia. Review of the MIP images confirms the above findings. IMPRESSION: 1. No evidence of pulmonary embolism. 2. Large left and small right pleural effusions. Resultant compressive atelectasis of the left lower lobe. 3. New mildly enlarged prevascular and left axillary lymph nodes. Other axillary lymph  nodes have increased in size when compared to prior study,  but remain non-enlarged by CT size criteria. Findings are concerning for new nodal metastases. 4. Diffuse sclerotic metastatic disease throughout the visible bony thorax, not significantly changed. 5.  Aortic atherosclerosis (ICD10-I70.0). Electronically Signed   By: Titus Dubin M.D.   On: 06/14/2017 15:46   US Renal  Result Date: 2017-06-14 CLINICAL DATA:  Acute renal disease EXAM: RENAL / URINARY TRACT ULTRASOUND COMPLETE COMPARISON:  None. FINDINGS: Right Kidney: Length: 12.9 cm. 6 mm cyst is noted in the midportion of the right kidney. No obstructive changes are noted. Left Kidney: Length: 11.3 cm. Echogenicity within normal limits. No mass or hydronephrosis visualized. Bladder: Bladder is decompressed by Foley catheter. IMPRESSION: Tiny right renal cyst.  No other focal abnormality is noted. Electronically Signed   By: Inez Catalina M.D.   On: Jun 14, 2017 11:46   Dg Chest Port 1 View  Result Date: 2017/06/14 CLINICAL DATA:  S/p [place] EXAM: PORTABLE CHEST - 1 VIEW COMPARISON:  Earlier film of the same day FINDINGS: No pneumothorax. Decrease in right pleural effusion. Persistent moderate left pleural effusion. Extensive patchy airspace opacities throughout both lungs with persistent dense consolidation at the left lung base. Endotracheal tube and nasogastric tube remain in place. Heart size difficult to assess due to adjacent opacities. Multiple osseous sclerotic lesions throughout the visualized axial and appendicular skeleton. IMPRESSION: 1. No pneumothorax post right thoracentesis. 2. Persistent bilateral airspace disease and moderate left effusion. 3.  Support hardware stable in position. Electronically Signed   By: Lucrezia Europe M.D.   On: 06-14-17 13:21   Dg Chest Port 1 View  Result Date: 2017-06-14 CLINICAL DATA:  82 year old male on ventilator. Prostate cancer. Subsequent encounter. EXAM: PORTABLE CHEST 1 VIEW COMPARISON:  06/08/2017  chest x-ray and chest CT. FINDINGS: Endotracheal tube tip 3.4 cm above the carina. Slight worsening of prominent diffuse airspace disease which may represent pulmonary edema with bilateral pleural effusions. Basilar atelectasis or infiltrates not excluded. No pneumothorax. Diffuse osseous sclerotic metastatic disease. IMPRESSION: Slight worsening of prominent diffuse airspace disease which may represent pulmonary edema with bilateral pleural effusions. Basilar atelectasis or infiltrates not excluded. Diffuse osseous sclerotic metastatic disease. Electronically Signed   By: Genia Del M.D.   On: 06/14/17 09:21   Dg Chest Portable 1 View  Result Date: 06/28/2017 CLINICAL DATA:  Intubation. EXAM: PORTABLE CHEST 1 VIEW COMPARISON:  Chest x-ray and CTA chest from same day. FINDINGS: Interval placement of an endotracheal tube with the tip approximately 8 cm above the level of the carina. The heart size and mediastinal contours are within normal limits. New perihilar and right lower lobe interstitial and alveolar opacities. Improved aeration at the left lung base. Layering left greater than right pleural effusions. No pneumothorax. Diffuse osseous metastases, unchanged. IMPRESSION: 1. Endotracheal tube in place with the tip approximately 8 cm above the level of the carina. 2. New perihilar and right lower lobe interstitial and alveolar opacities, concerning for pulmonary edema. 3. Improved aeration at the left lung base. Layering bilateral pleural effusions. Electronically Signed   By: Titus Dubin M.D.   On: 06/22/2017 16:58   Dg Chest Portable 1 View  Result Date: 06/09/2017 CLINICAL DATA:  Shortness of breath. Evaluate for CHF. History of metastatic prostate cancer. EXAM: PORTABLE CHEST 1 VIEW COMPARISON:  CT chest, abdomen and pelvis dated Sep 06, 2016. FINDINGS: The heart size and mediastinal contours are within normal limits. Normal pulmonary vascularity. Left basilar opacity with adjacent small left  pleural effusion. Right basilar atelectasis. No pneumothorax. Diffuse sclerosis  of the visualized bony thorax. IMPRESSION: 1. Left lower lobe consolidation, concerning for pneumonia. Adjacent small left pleural effusion. 2. Diffuse sclerosis of the visualized bony thorax, consistent with history of metastatic prostate cancer. Electronically Signed   By: Titus Dubin M.D.   On: 06/15/2017 14:38   Dg Abd Portable 1 View  Result Date: 06/14/2017 CLINICAL DATA:  OG tube placement EXAM: PORTABLE ABDOMEN - 1 VIEW COMPARISON:  None. FINDINGS: NG tube tip is in the proximal stomach. IMPRESSION: NG tube tip in the proximal stomach. Electronically Signed   By: Rolm Baptise M.D.   On: 06/20/2017 16:54   US Thoracentesis Asp Pleural Space W/img Guide  Result Date: 06-23-17 INDICATION: Patient with bilateral pleural effusions. Request is made for diagnostic and therapeutic thoracentesis. EXAM: ULTRASOUND GUIDED DIAGNOSTIC AND THERAPEUTIC RIGHT THORACENTESIS MEDICATIONS: 10 mL 1% lidocaine COMPLICATIONS: None immediate. PROCEDURE: An ultrasound guided thoracentesis was thoroughly discussed with the patient and questions answered. The benefits, risks, alternatives and complications were also discussed. The patient understands and wishes to proceed with the procedure. Written consent was obtained. Ultrasound was performed to localize and mark an adequate pocket of fluid in the right chest. The area was then prepped and draped in the normal sterile fashion. 1% Lidocaine was used for local anesthesia. Under ultrasound guidance a Safe-T-Centesis catheter was introduced. Thoracentesis was performed. The catheter was removed and a dressing applied. FINDINGS: A total of approximately 1.1 liters of clear, yellow fluid was removed. Samples were sent to the laboratory as requested by the clinical team. IMPRESSION: Successful ultrasound guided diagnostic and therapeutic right thoracentesis yielding 1.1 liters of pleural fluid.  Read by: Brynda Greathouse PA-C No pneumothorax on follow-up chest radiograph. Electronically Signed   By: Lucrezia Europe M.D.   On: 06-23-17 13:55    Microbiology Recent Results (from the past 240 hour(s))  Blood Culture (routine x 2)     Status: None (Preliminary result)   Collection Time: 06/07/2017  3:22 PM  Result Value Ref Range Status   Specimen Description BLOOD LEFT HAND  Final   Special Requests   Final    BOTTLES DRAWN AEROBIC AND ANAEROBIC Blood Culture adequate volume   Culture   Final    NO GROWTH < 24 HOURS Performed at [facility], [address], [place], [place] [address]    Report Status PENDING  Incomplete  Blood Culture (routine x 2)     Status: None (Preliminary result)   Collection Time: 06/24/2017  3:22 PM  Result Value Ref Range Status   Specimen Description BLOOD RAC  Final   Special Requests   Final    BOTTLES DRAWN AEROBIC AND ANAEROBIC Blood Culture results may not be optimal due to an inadequate volume of blood received in culture bottles   Culture   Final    NO GROWTH < 24 HOURS Performed at [facility], [address], [place], [place] [address]    Report Status PENDING  Incomplete  MRSA PCR Screening     Status: None   Collection Time: 06/21/2017  6:20 PM  Result Value Ref Range Status   MRSA by PCR NEGATIVE NEGATIVE Final    Comment:        The GeneXpert MRSA Assay (FDA approved for NASAL specimens only), is one component of a comprehensive MRSA colonization surveillance program. It is not intended to diagnose MRSA infection nor to guide or monitor treatment for MRSA infections. Performed at [facility], [place], [place]  [address]     Lab Basic Metabolic Panel: Recent Labs  Lab 06/18/2017 1424 06-17-2017 0019 June 17, 2017 0708  NA 131* 131* 133*  K 4.8 5.9* 5.5*  CL 98* 103 105  CO2 21* 16* 17*  GLUCOSE 140* 156* 174*  BUN 25* 27* 32*  CREATININE 0.80 1.04 1.09  CALCIUM 8.9 7.4* 7.0*  MG  --   2.9*  --   PHOS  --  5.9*  --    Liver Function Tests: Recent Labs  Lab 06/23/2017 1424  AST 42*  ALT 22  ALKPHOS 112  BILITOT 0.5  PROT 6.7  ALBUMIN 3.5   No results for input(s): LIPASE, AMYLASE in the last 168 hours. No results for input(s): AMMONIA in the last 168 hours. CBC: Recent Labs  Lab 06/19/2017 1424 06/17/2017 0019 06-17-2017 0708  WBC 2.8* 3.3* 3.7*  NEUTROABS 1.9  --   --   HGB 6.8* 8.1* 7.9*  HCT 20.1* 24.5* 23.7*  MCV 95.2 96.7 96.5  PLT 97* 100* 92*   Cardiac Enzymes: Recent Labs  Lab 06/22/2017 1424 06-17-2017 0348  TROPONINI 0.25* 17.12*   Sepsis Labs: Recent Labs  Lab 06/23/2017 1424 06/14/2017 1522 06/10/17 2229 06-17-2017 0019 Jun 17, 2017 0708  PROCALCITON  --  <0.10  --   --   --   WBC 2.8*  --   --  3.3* 3.7*  LATICACIDVEN  --  4.0* 1.9  --   --     Procedures/Operations  Right sided thoracentesis Mechanical Intubation   Marda Stalker, [place] Pager [phone] (please enter 7 digits) PCCM Consult Pager [phone] (please enter 7 digits)

## 2017-07-05 NOTE — Progress Notes (Signed)
Initial Nutrition Assessment  DOCUMENTATION CODES:   Not applicable  INTERVENTION:  If patient becomes hemodynamically stable and will not be extubated within 24-48 hrs, recommend TF otherwise monitor GOC  NUTRITION DIAGNOSIS:   Inadequate oral intake related to inability to eat as evidenced by NPO status.  GOAL:   Provide needs based on ASPEN/SCCM guidelines  MONITOR:   I & O's, Labs, Vent status, Skin, Weight trends  REASON FOR ASSESSMENT:   Ventilator    ASSESSMENT:   82 year old gentleman with a past medical history remarkable for hyperlipidemia, asthma, prostate cancer, metastatic, castrate resistant, chronic anemia and transfusion dependent who presented to the emergency department with complaints of shortness of breath requiring intubation  Patient with multilobar pneumonia vs flash pulmonary edema.  Patient discussed in rounds. Completing ultrasound of kidneys + thoracentesis during visit. Weight has been stable. RD will continue to follow. Currently not hemodynamically stable enough for TFs  Patient is currently intubated on ventilator support MV: 10.6 L/min Temp (24hrs), Avg:98.1 F (36.7 C), Min:95.7 F (35.4 C), Max:98.8 F (37.1 C)  Propofol: None  Intake/Output Summary (Last 24 hours) at 06/23/17 1113 Last data filed at 2017-06-23 0600 Gross per 24 hour  Intake 2750.09 ml  Output 330 ml  Net 2420.09 ml   Labs reviewed:  CBGs 173, 285, 166 Na 133, K+ 5.5, Phos 5.9, Mg 2.9 MAP: 64-86  Medications reviewed and include:  Ca-Vitamin D, Iron, Solumedrol, MVI NS at 170mL/hr Neo gtt (66mcg/min)   NUTRITION - FOCUSED PHYSICAL EXAM:  Unable to complete at this time  Diet Order:  Diet NPO time specified Aspiration precautions  EDUCATION NEEDS:   Not appropriate for education at this time  Skin:  Skin Assessment: Reviewed RN Assessment  Last BM:  PTA  Height:   Ht Readings from Last 1 Encounters:  07/04/2017 6' (1.829 m)    Weight:    Wt Readings from Last 1 Encounters:  06-23-17 177 lb 4 oz (80.4 kg)    Ideal Body Weight:  80.9 kg  BMI:  Body mass index is 24.04 kg/m.  Estimated Nutritional Needs:   Kcal:  1808 calories  Protein:  97-121 grams (1.2-1.5g/kg)  Fluid:  Per MD  Satira Anis. Rush Salce, MS, RD LDN Inpatient Clinical Dietitian Pager [phone]

## 2017-07-05 NOTE — Progress Notes (Signed)
[facility] Progess Note    SYNOPSIS   82 year old gentleman with a past medical history remarkable for hyperlipidemia, asthma, prostate cancer, metastatic, castrate resistant, chronic anemia and transfusion dependent who presented to the emergency department with complaints of shortness of breath requiring intubation  ASSESSMENT/PLAN   Respiratory failure. Multilobar pneumonia versus flash pulmonary edema. Per report patient does have a history of aortic valvular disease. Also noted to have an elevated troponin this morning of 17 without any significant EKG changes. We'll continue mechanical ventilation, unable to diurese at this point secondary to soft blood pressure with a systolic of 90. Is on broad-spectrum antibiotic coverage to include vancomycin and Zosyn. Consulting cardiology although secondary to his refractory anemia and thrombocytopenia limited therapeutics most likely will only be medically managed at this time.  Prostate cancer. Metastatic and involving bone with refractory anemia requiring multiple transfusions. Per oncology no further additional therapeutics can be offered. Patient is presently a no CPR if he would have a cardiac arrest otherwise aggressive support per wife and family.  Anemia. Status post transfusion  Elevated BUN/creatinine ratio. Will check renal ultrasound, Foley in place.   CCT 45 Minutes  VENTILATOR SETTINGS: Vent Mode: PRVC FiO2 (%):  [50 %-100 %] 50 % Set Rate:  [16 bmp-23 bmp] 23 bmp Vt Set:  [500 mL] 500 mL PEEP:  [5 cmH20] 5 cmH20  INTAKE / OUTPUT:  Intake/Output Summary (Last 24 hours) at Jun 28, 2017 1610 Last data filed at 06/28/2017 0600 Gross per 24 hour  Intake 2750.09 ml  Output 330 ml  Net 2420.09 ml    Name: Preston Wilson MRN: [id number] DOB: 1935-10-08    ADMISSION DATE:  06/13/2017  SUBJECTIVE:   Presently on mechanical ventilation, communicated and waved when I said good morning.  Positive troponin on this morning's lab. Patient does have a prior history of aortic stenosis  VITAL SIGNS: Temp:  [95.7 F (35.4 C)-98.8 F (37.1 C)] 98.2 F (36.8 C) (02/05 0800) Pulse Rate:  [92-145] 107 (02/05 0800) Resp:  [8-37] 18 (02/05 0800) BP: (78-133)/(54-95) 94/61 (02/05 0800) SpO2:  [95 %-100 %] 99 % (02/05 0800) FiO2 (%):  [50 %-100 %] 50 % (02/05 0410) Weight:  [165 lb (74.8 kg)-177 lb 4 oz (80.4 kg)] 177 lb 4 oz (80.4 kg) (02/05 0440)  PHYSICAL EXAMINATION: Physical Examination:   VS: BP 94/61   Pulse (!) 107   Temp 98.2 F (36.8 C)   Resp 18   Ht 6' (1.829 m)   Wt 177 lb 4 oz (80.4 kg)   SpO2 99%   BMI 24.04 kg/m   General Appearance: Resting comfortably this morning Neuro: Responds appropriately. HEENT: Trachea is midline, no thyromegaly appreciated Pulmonary: Crackles appreciated bilateral  CardiovascularNormal S1,S2.  No m/r/g.   Abdomen: Benign, Soft, non-tender. Skin:   warm, no rashes, no ecchymosis  Extremities: normal, no cyanosis, clubbing.    LABORATORY PANEL:   CBC Recent Labs  Lab 06-28-2017 0708  WBC 3.7*  HGB 7.9*  HCT 23.7*  PLT 92*    Chemistries  Recent Labs  Lab 06/26/2017 1424 28-Jun-2017 0019 28-Jun-2017 0708  NA 131* 131* 133*  K 4.8 5.9* 5.5*  CL 98* 103 105  CO2 21* 16* 17*  GLUCOSE 140* 156* 174*  BUN 25* 27* 32*  CREATININE 0.80 1.04 1.09  CALCIUM 8.9 7.4* 7.0*  MG  --  2.9*  --   PHOS  --  5.9*  --   AST 42*  --   --  ALT 22  --   --   ALKPHOS 112  --   --   BILITOT 0.5  --   --     Recent Labs  Lab 06/23/2017 1655 07/01/2017 1834 12-Jun-2017 0717 06-12-2017 0729 2017/06/12 0809  GLUCAP 319* 349* 166* 285* 173*   Recent Labs  Lab 06/29/2017 1720 06/07/2017 2032  PHART 7.05* 7.25*  PCO2ART 55* 45  PO2ART 153* 367*   Recent Labs  Lab 07/02/2017 1424  AST 42*  ALT 22  ALKPHOS 112  BILITOT 0.5  ALBUMIN 3.5    Cardiac Enzymes Recent Labs  Lab 2017/06/12 0348  TROPONINI 17.12*    RADIOLOGY:  Ct Angio  Chest Pe W And/or Wo Contrast  Result Date: 06/18/2017 CLINICAL DATA:  Shortness of breath. EXAM: CT ANGIOGRAPHY CHEST WITH CONTRAST TECHNIQUE: Multidetector CT imaging of the chest was performed using the standard protocol during bolus administration of intravenous contrast. Multiplanar CT image reconstructions and MIPs were obtained to evaluate the vascular anatomy. CONTRAST:  69mL ISOVUE-370 IOPAMIDOL (ISOVUE-370) INJECTION 76% COMPARISON:  CT chest dated Sep 06, 2016. FINDINGS: Cardiovascular: Satisfactory opacification of the pulmonary arteries to the segmental level. No evidence of pulmonary embolism. Normal heart size. No pericardial effusion. Normal caliber thoracic aorta. Coronary, aortic arch, and branch vessel atherosclerotic vascular disease. Mediastinum/Nodes: New mildly enlarged left axillary lymph node, measuring up to 1.2 cm in short axis. New mildly enlarged prevascular lymph node, measuring 1.3 cm in short axis. No hilar lymphadenopathy. The thyroid gland, trachea, and esophagus demonstrate no significant abnormalities. Lungs/Pleura: Large right and small left pleural effusions. Right basilar subsegmental atelectasis. Compressive atelectasis of the left lower lobe. No consolidation or pneumothorax. No suspicious pulmonary nodule. Upper Abdomen: No acute abnormality. New borderline enlarged left cardiophrenic lymph node. Musculoskeletal: Diffuse sclerotic metastatic disease throughout the visible bony thorax is unchanged. No fracture. Mild gynecomastia. Review of the MIP images confirms the above findings. IMPRESSION: 1. No evidence of pulmonary embolism. 2. Large left and small right pleural effusions. Resultant compressive atelectasis of the left lower lobe. 3. New mildly enlarged prevascular and left axillary lymph nodes. Other axillary lymph nodes have increased in size when compared to prior study, but remain non-enlarged by CT size criteria. Findings are concerning for new nodal metastases. 4.  Diffuse sclerotic metastatic disease throughout the visible bony thorax, not significantly changed. 5.  Aortic atherosclerosis (ICD10-I70.0). Electronically Signed   By: Titus Dubin M.D.   On: 06/09/2017 15:46   Dg Chest Portable 1 View  Result Date: 06/12/2017 CLINICAL DATA:  Intubation. EXAM: PORTABLE CHEST 1 VIEW COMPARISON:  Chest x-ray and CTA chest from same day. FINDINGS: Interval placement of an endotracheal tube with the tip approximately 8 cm above the level of the carina. The heart size and mediastinal contours are within normal limits. New perihilar and right lower lobe interstitial and alveolar opacities. Improved aeration at the left lung base. Layering left greater than right pleural effusions. No pneumothorax. Diffuse osseous metastases, unchanged. IMPRESSION: 1. Endotracheal tube in place with the tip approximately 8 cm above the level of the carina. 2. New perihilar and right lower lobe interstitial and alveolar opacities, concerning for pulmonary edema. 3. Improved aeration at the left lung base. Layering bilateral pleural effusions. Electronically Signed   By: Titus Dubin M.D.   On: 06/08/2017 16:58   Dg Chest Portable 1 View  Result Date: 06/10/2017 CLINICAL DATA:  Shortness of breath. Evaluate for CHF. History of metastatic prostate cancer. EXAM: PORTABLE CHEST 1 VIEW COMPARISON:  CT chest, abdomen and pelvis dated Sep 06, 2016. FINDINGS: The heart size and mediastinal contours are within normal limits. Normal pulmonary vascularity. Left basilar opacity with adjacent small left pleural effusion. Right basilar atelectasis. No pneumothorax. Diffuse sclerosis of the visualized bony thorax. IMPRESSION: 1. Left lower lobe consolidation, concerning for pneumonia. Adjacent small left pleural effusion. 2. Diffuse sclerosis of the visualized bony thorax, consistent with history of metastatic prostate cancer. Electronically Signed   By: Titus Dubin M.D.   On: 06/09/2017 14:38   Dg Abd  Portable 1 View  Result Date: 06/08/2017 CLINICAL DATA:  OG tube placement EXAM: PORTABLE ABDOMEN - 1 VIEW COMPARISON:  None. FINDINGS: NG tube tip is in the proximal stomach. IMPRESSION: NG tube tip in the proximal stomach. Electronically Signed   By: Rolm Baptise M.D.   On: 06/21/2017 16:54     Hermelinda Dellen, DO  February 25, 2019Patient ID: [name], male   DOB: 05-01-1936, 82 y.o.   MRN: [id number]

## 2017-07-05 NOTE — Progress Notes (Signed)
SLP Cancellation Note  Patient Details Name: Preston Wilson MRN: [id number] DOB: 01-29-1936   Cancelled treatment:       Reason Eval/Treat Not Completed: Patient not medically ready(chart reviewed; consulted NSG briefly). Pt is orally intubated and not appropriate for BSE at this time. ST services will f/u w/ BSE when pt is orally extubated. Diet order cancelled. Recommend frequent oral care; aspiration precautions.    Orinda Kenner, MS, CCC-SLP [place] June 25, 2017, 8:47 AM

## 2017-07-05 NOTE — Progress Notes (Signed)
Inpatient Diabetes Program Recommendations  AACE/ADA: New Consensus Statement on Inpatient Glycemic Control (2015)  Target Ranges:  Prepandial:   less than 140 mg/dL      Peak postprandial:   less than 180 mg/dL (1-2 hours)      Critically ill patients:  140 - 180 mg/dL   Lab Results  Component Value Date   GLUCAP 173 (H) June 20, 2017    Review of Glycemic ControlResults for MCCLAIN, SHALL (MRN [id number]) as of June 20, 2017 11:21  Ref. Range 06/07/2017 16:55 06/30/2017 18:34 06/20/2017 07:17 06/20/2017 07:29 2017-06-20 08:09  Glucose-Capillary Latest Ref Range: 65 - 99 mg/dL 319 (H) 349 (H) 166 (H) 285 (H) 173 (H)   Diabetes history: Type 2 DM Outpatient Diabetes medications: None Current orders for Inpatient glycemic control:  none Inpatient Diabetes Program Recommendations:   If appropriate, consider adding Novolog sensitive q 4 hours.   Thanks,  Adah Perl, RN, BC-ADM Inpatient Diabetes Coordinator Pager [phone] (8a-5p)

## 2017-07-05 NOTE — Progress Notes (Signed)
Spoken with [facility] Physician and [place] [facility] NP about declining urine output and patient's systolic blood pressures being in 80's. MAPs have consistently been greater than 65. See EMAR for orders and interventions (Kayexalate, two 500 ml fluid bolus). Will recheck potassium with AM labs. Continue to monitor and assess.

## 2017-07-05 NOTE — Progress Notes (Signed)
Spoke to pt's RN over the phone; left message/ [my number- [phone] for Intensivist Dr.Conforti to discuss re: pt's plan of care.

## 2017-07-05 NOTE — Telephone Encounter (Signed)
Spoke to Dr. Jefferson Fuel- pt current intubated for respiratory failure. No acute issues from prostate cancer stand point; will evaluate pt tomorrow AM.

## 2017-07-05 NOTE — Progress Notes (Signed)
Care assumed of patient at 1900 BP low neo-synephrine infusing increased multiple times with little change in BP. NP and this nurse in room providing care when patient became asystole and pulseless, pronounced by NP at [place]. Family called back to room. Chaplain present for family (2 sons, daughter in law and wife). [employer] picked up body at this time.

## 2017-07-05 NOTE — Procedures (Signed)
PROCEDURE SUMMARY:  Successful US guided right diagnostic and therapeutic thoracentesis. Yielded 1.1 liters of yellow fluid. Pt tolerated procedure well. No immediate complications.  Specimen was sent for labs. CXR ordered.  Docia Barrier PA-C June 16, 2017 10:54 AM

## 2017-07-05 NOTE — Consult Note (Signed)
Pharmacy Antibiotic Note  Preston Wilson is a 82 y.o. male admitted on 06/07/2017 with  pneumonia.  Pharmacy has been consulted for Zosyn and vancomycin dosing. Patient received ceftriaxone and azithromycin IV x 1 dose in ED.   MRSA PCR is negative  Plan: After discussion with Dr. Jefferson Fuel, will d/c vancomycin.   Continue Zosyn 3.375 IV EI every 8 hours.   Height: 6' (182.9 cm) Weight: 177 lb 4 oz (80.4 kg) IBW/kg (Calculated) : 77.6  Temp (24hrs), Avg:98.1 F (36.7 C), Min:95.7 F (35.4 C), Max:98.8 F (37.1 C)  Recent Labs  Lab 06/21/2017 1424 07/03/2017 1522 06/16/2017 2229 June 15, 2017 0019 2017/06/15 0708  WBC 2.8*  --   --  3.3* 3.7*  CREATININE 0.80  --   --  1.04 1.09  LATICACIDVEN  --  4.0* 1.9  --   --     Estimated Creatinine Clearance: 59.3 mL/min (by C-G formula based on SCr of 1.09 mg/dL).    No Known Allergies  Antimicrobials this admission: 2/4 ceftriaxone/azithromycin >> x 1 dose 2/4 Zosyn >>  2/4 vancomycin >> 2/5  Dose adjustments this admission:  Microbiology results: 2/4 BCx: NGTD 2/4 MRSA PCR: negative  Thank you for allowing pharmacy to be a part of this patient's care.  Napoleon Form, PharmD, BCPS Clinical Pharmacist 06-15-17 12:11 PM

## 2017-07-05 NOTE — Progress Notes (Signed)
[place] [facility] [place] NAME: Long Brimage    MR#:  [id number]  DATE OF BIRTH:  08/17/1935  SUBJECTIVE:   Pt critically ill and intubated On IV Phenylephrine IV fentanyl gtt for sedation REVIEW OF SYSTEMS:   Review of Systems  Constitutional: Negative for chills, fever and weight loss.  HENT: Negative for ear discharge, ear pain and nosebleeds.   Eyes: Negative for blurred vision, pain and discharge.  Respiratory: Negative for sputum production, shortness of breath, wheezing and stridor.   Cardiovascular: Negative for chest pain, palpitations, orthopnea and PND.  Gastrointestinal: Negative for abdominal pain, diarrhea, nausea and vomiting.  Genitourinary: Negative for frequency and urgency.  Musculoskeletal: Negative for back pain and joint pain.  Neurological: Positive for weakness. Negative for sensory change, speech change and focal weakness.  Psychiatric/Behavioral: Negative for depression and hallucinations. The patient is not nervous/anxious.    Tolerating Diet:npo intubated Tolerating PT: pending  DRUG ALLERGIES:  No Known Allergies  VITALS:  Blood pressure (!) 72/52, pulse (!) 105, temperature 99.7 F (37.6 C), resp. rate (!) 23, height 6' (1.829 m), weight 80.4 kg (177 lb 4 oz), SpO2 (!) 80 %.  PHYSICAL EXAMINATION:   Physical Exam  GENERAL:  82 y.o.-year-old patient lying in the bed with no acute distress. Critically ill. Intubated and on the vent EYES: Pupils equal, round, reactive to light and accommodation. No scleral icterus.  HEENT: Head atraumatic, normocephalic. Oropharynx and nasopharynx clear. NG and OG + NECK:  Supple, no jugular venous distention. No thyroid enlargement, no tenderness.  LUNGS: Normal breath sounds bilaterally, no wheezing, rales, rhonchi. No use of accessory muscles of respiration.  CARDIOVASCULAR: S1, S2 normal. No murmurs, rubs, or gallops. tachcyardia ABDOMEN: Soft, nontender,  nondistended. Bowel sounds present. No organomegaly or mass.  EXTREMITIES: No cyanosis, clubbing or edema b/l.    NEUROLOGIC:intubated PSYCHIATRIC: pt sedated and on the vent  SKIN: No obvious rash, lesion, or ulcer.   LABORATORY PANEL:  CBC Recent Labs  Lab 2017-07-08 0708  WBC 3.7*  HGB 7.9*  HCT 23.7*  PLT 92*    Chemistries  Recent Labs  Lab 06/24/2017 1424 07-08-2017 0019 07-08-2017 0708  NA 131* 131* 133*  K 4.8 5.9* 5.5*  CL 98* 103 105  CO2 21* 16* 17*  GLUCOSE 140* 156* 174*  BUN 25* 27* 32*  CREATININE 0.80 1.04 1.09  CALCIUM 8.9 7.4* 7.0*  MG  --  2.9*  --   AST 42*  --   --   ALT 22  --   --   ALKPHOS 112  --   --   BILITOT 0.5  --   --    Cardiac Enzymes Recent Labs  Lab 07-08-2017 0348  TROPONINI 17.12*   RADIOLOGY:  Ct Angio Chest Pe W And/or Wo Contrast  Result Date: 06/16/2017 CLINICAL DATA:  Shortness of breath. EXAM: CT ANGIOGRAPHY CHEST WITH CONTRAST TECHNIQUE: Multidetector CT imaging of the chest was performed using the standard protocol during bolus administration of intravenous contrast. Multiplanar CT image reconstructions and MIPs were obtained to evaluate the vascular anatomy. CONTRAST:  23mL ISOVUE-370 IOPAMIDOL (ISOVUE-370) INJECTION 76% COMPARISON:  CT chest dated Sep 06, 2016. FINDINGS: Cardiovascular: Satisfactory opacification of the pulmonary arteries to the segmental level. No evidence of pulmonary embolism. Normal heart size. No pericardial effusion. Normal caliber thoracic aorta. Coronary, aortic arch, and branch vessel atherosclerotic vascular disease. Mediastinum/Nodes: New mildly enlarged left axillary lymph node, measuring up to 1.2 cm  in short axis. New mildly enlarged prevascular lymph node, measuring 1.3 cm in short axis. No hilar lymphadenopathy. The thyroid gland, trachea, and esophagus demonstrate no significant abnormalities. Lungs/Pleura: Large right and small left pleural effusions. Right basilar subsegmental atelectasis. Compressive  atelectasis of the left lower lobe. No consolidation or pneumothorax. No suspicious pulmonary nodule. Upper Abdomen: No acute abnormality. New borderline enlarged left cardiophrenic lymph node. Musculoskeletal: Diffuse sclerotic metastatic disease throughout the visible bony thorax is unchanged. No fracture. Mild gynecomastia. Review of the MIP images confirms the above findings. IMPRESSION: 1. No evidence of pulmonary embolism. 2. Large left and small right pleural effusions. Resultant compressive atelectasis of the left lower lobe. 3. New mildly enlarged prevascular and left axillary lymph nodes. Other axillary lymph nodes have increased in size when compared to prior study, but remain non-enlarged by CT size criteria. Findings are concerning for new nodal metastases. 4. Diffuse sclerotic metastatic disease throughout the visible bony thorax, not significantly changed. 5.  Aortic atherosclerosis (ICD10-I70.0). Electronically Signed   By: Titus Dubin M.D.   On: 06/18/2017 15:46   US Renal  Result Date: 2017-06-22 CLINICAL DATA:  Acute renal disease EXAM: RENAL / URINARY TRACT ULTRASOUND COMPLETE COMPARISON:  None. FINDINGS: Right Kidney: Length: 12.9 cm. 6 mm cyst is noted in the midportion of the right kidney. No obstructive changes are noted. Left Kidney: Length: 11.3 cm. Echogenicity within normal limits. No mass or hydronephrosis visualized. Bladder: Bladder is decompressed by Foley catheter. IMPRESSION: Tiny right renal cyst.  No other focal abnormality is noted. Electronically Signed   By: Inez Catalina M.D.   On: 2017/06/22 11:46   Dg Chest Port 1 View  Result Date: 06/22/17 CLINICAL DATA:  S/p Thora EXAM: PORTABLE CHEST - 1 VIEW COMPARISON:  Earlier film of the same day FINDINGS: No pneumothorax. Decrease in right pleural effusion. Persistent moderate left pleural effusion. Extensive patchy airspace opacities throughout both lungs with persistent dense consolidation at the left lung base.  Endotracheal tube and nasogastric tube remain in place. Heart size difficult to assess due to adjacent opacities. Multiple osseous sclerotic lesions throughout the visualized axial and appendicular skeleton. IMPRESSION: 1. No pneumothorax post right thoracentesis. 2. Persistent bilateral airspace disease and moderate left effusion. 3.  Support hardware stable in position. Electronically Signed   By: Lucrezia Europe M.D.   On: 2017-06-22 13:21   Dg Chest Port 1 View  Result Date: 06-22-2017 CLINICAL DATA:  82 year old male on ventilator. Prostate cancer. Subsequent encounter. EXAM: PORTABLE CHEST 1 VIEW COMPARISON:  06/09/2017 chest x-ray and chest CT. FINDINGS: Endotracheal tube tip 3.4 cm above the carina. Slight worsening of prominent diffuse airspace disease which may represent pulmonary edema with bilateral pleural effusions. Basilar atelectasis or infiltrates not excluded. No pneumothorax. Diffuse osseous sclerotic metastatic disease. IMPRESSION: Slight worsening of prominent diffuse airspace disease which may represent pulmonary edema with bilateral pleural effusions. Basilar atelectasis or infiltrates not excluded. Diffuse osseous sclerotic metastatic disease. Electronically Signed   By: Genia Del M.D.   On: 2017/06/22 09:21   Dg Chest Portable 1 View  Result Date: 06/10/2017 CLINICAL DATA:  Intubation. EXAM: PORTABLE CHEST 1 VIEW COMPARISON:  Chest x-ray and CTA chest from same day. FINDINGS: Interval placement of an endotracheal tube with the tip approximately 8 cm above the level of the carina. The heart size and mediastinal contours are within normal limits. New perihilar and right lower lobe interstitial and alveolar opacities. Improved aeration at the left lung base. Layering left greater than right  pleural effusions. No pneumothorax. Diffuse osseous metastases, unchanged. IMPRESSION: 1. Endotracheal tube in place with the tip approximately 8 cm above the level of the carina. 2. New perihilar and  right lower lobe interstitial and alveolar opacities, concerning for pulmonary edema. 3. Improved aeration at the left lung base. Layering bilateral pleural effusions. Electronically Signed   By: Titus Dubin M.D.   On: 06/16/2017 16:58   Dg Chest Portable 1 View  Result Date: 07/02/2017 CLINICAL DATA:  Shortness of breath. Evaluate for CHF. History of metastatic prostate cancer. EXAM: PORTABLE CHEST 1 VIEW COMPARISON:  CT chest, abdomen and pelvis dated Sep 06, 2016. FINDINGS: The heart size and mediastinal contours are within normal limits. Normal pulmonary vascularity. Left basilar opacity with adjacent small left pleural effusion. Right basilar atelectasis. No pneumothorax. Diffuse sclerosis of the visualized bony thorax. IMPRESSION: 1. Left lower lobe consolidation, concerning for pneumonia. Adjacent small left pleural effusion. 2. Diffuse sclerosis of the visualized bony thorax, consistent with history of metastatic prostate cancer. Electronically Signed   By: Titus Dubin M.D.   On: 06/30/2017 14:38   Dg Abd Portable 1 View  Result Date: 06/20/2017 CLINICAL DATA:  OG tube placement EXAM: PORTABLE ABDOMEN - 1 VIEW COMPARISON:  None. FINDINGS: NG tube tip is in the proximal stomach. IMPRESSION: NG tube tip in the proximal stomach. Electronically Signed   By: Rolm Baptise M.D.   On: 06/17/2017 16:54   US Thoracentesis Asp Pleural Space W/img Guide  Result Date: June 19, 2017 INDICATION: Patient with bilateral pleural effusions. Request is made for diagnostic and therapeutic thoracentesis. EXAM: ULTRASOUND GUIDED DIAGNOSTIC AND THERAPEUTIC RIGHT THORACENTESIS MEDICATIONS: 10 mL 1% lidocaine COMPLICATIONS: None immediate. PROCEDURE: An ultrasound guided thoracentesis was thoroughly discussed with the patient and questions answered. The benefits, risks, alternatives and complications were also discussed. The patient understands and wishes to proceed with the procedure. Written consent was obtained.  Ultrasound was performed to localize and mark an adequate pocket of fluid in the right chest. The area was then prepped and draped in the normal sterile fashion. 1% Lidocaine was used for local anesthesia. Under ultrasound guidance a Safe-T-Centesis catheter was introduced. Thoracentesis was performed. The catheter was removed and a dressing applied. FINDINGS: A total of approximately 1.1 liters of clear, yellow fluid was removed. Samples were sent to the laboratory as requested by the clinical team. IMPRESSION: Successful ultrasound guided diagnostic and therapeutic right thoracentesis yielding 1.1 liters of pleural fluid. Read by: Brynda Greathouse PA-C No pneumothorax on follow-up chest radiograph. Electronically Signed   By: Lucrezia Europe M.D.   On: 06/19/17 13:55   ASSESSMENT AND PLAN:   82 year old male with metastatic prostate cancer and anemia of chronic disease who is now transfusion dependent presents with shortness of breath.  1. Septic shock with Acute hypoxic respiratory failure in the setting of lower lobe consolidation and atelectasis with bilateral pleural effusion -Patient now intubation. -IV vanc and zosyn -f/u BC -s/p thoracentesis 1.1 liter fluid removed  2. Pneumonia: Continue Zosyn and vanc Follow-up with influenza testing  3. Pleural effusion left greater than right:  -s/p USG thoracentesis  4. Acute on chronic CHF with acute NSTEMI -suspected demand ischemia ?true ACS -cardiology consult pending--Dr Callwood to see pt  4. Metastatic prostate cancer with anemia of chronic disease, transfusion dependent Transfuse as needed  5. History of COPD/asthma exacerbation: Start IV steroids Continue DuoNeb's  critically ill and poor prognosis  Case discussed with Care Management/Social Worker. Management plans discussed with the patient, family and  they are in agreement.  CODE STATUS: Partial  DVT Prophylaxis: *lovenox*  TOTAL TIME TAKING CARE OF THIS PATIENT: 30  minutes.  >50% time spent on counselling and coordination of care  POSSIBLE D/C several DAYS, DEPENDING ON CLINICAL CONDITION.  Note: This dictation was prepared with Dragon dictation along with smaller phrase technology. Any transcriptional errors that result from this process are unintentional.  Fritzi Mandes M.D on 07/01/2017 at 6:39 PM  Between 7am to 6pm - Pager - [phone]  After 6pm go to [email or web address] - password [email or web address] [facility]  Office  [phone]  CC: Primary care physician; Lenard Simmer, MDPatient ID: [name], male   DOB: [birth year], 82 y.o.   MRN: [id number]

## 2017-07-05 NOTE — Telephone Encounter (Signed)
noted 

## 2017-07-05 DEATH — deceased

## 2017-07-18 ENCOUNTER — Other Ambulatory Visit: Payer: Self-pay | Admitting: Nurse Practitioner

## 2017-07-24 ENCOUNTER — Ambulatory Visit: Payer: Medicare Other | Admitting: Radiation Oncology

## 2018-08-06 IMAGING — CT CT ANGIO CHEST
2 of 6 series · 18 of 46 positions shown · IV contrast (iopamidol)
Comparison: CT chest dated September 06, 2016.

CLINICAL DATA: Shortness of breath.

EXAM:
CT ANGIOGRAPHY CHEST WITH CONTRAST
TECHNIQUE: Multidetector CT imaging of the chest was performed using the
standard protocol during bolus administration of intravenous
contrast. Multiplanar CT image reconstructions and MIPs were
obtained to evaluate the vascular anatomy.
CONTRAST:  75mL FTGOBK-[id number] IOPAMIDOL (FTGOBK-ZD8) INJECTION 76%

[Series 6: thins · axial · 0.79mm/px · z∈[-403,-76]mm · 15 of 359 slices shown]
[im 16/359  lung]
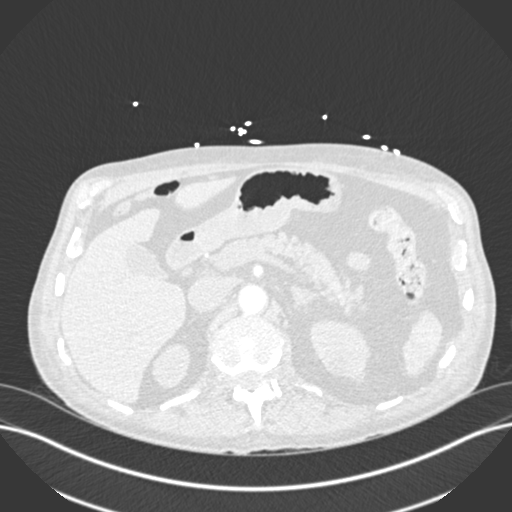
[im 47/359  soft-tissue]
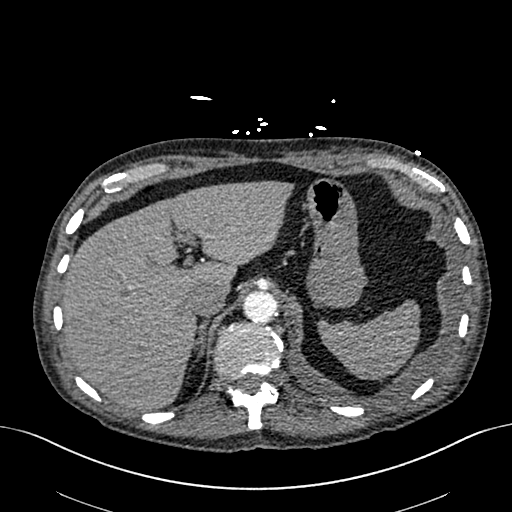
[im 63/359  lung]
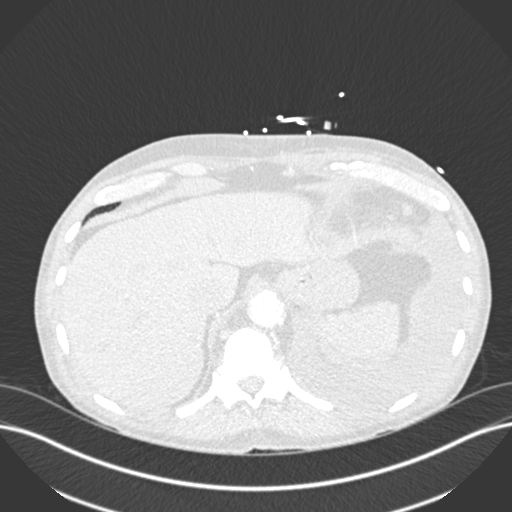
[im 94/359  soft-tissue]
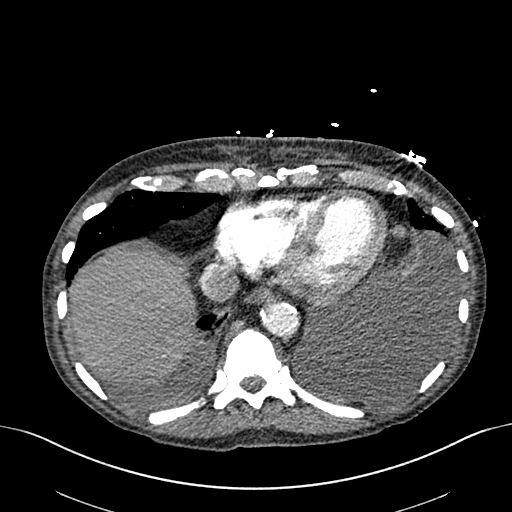
[im 109/359  lung]
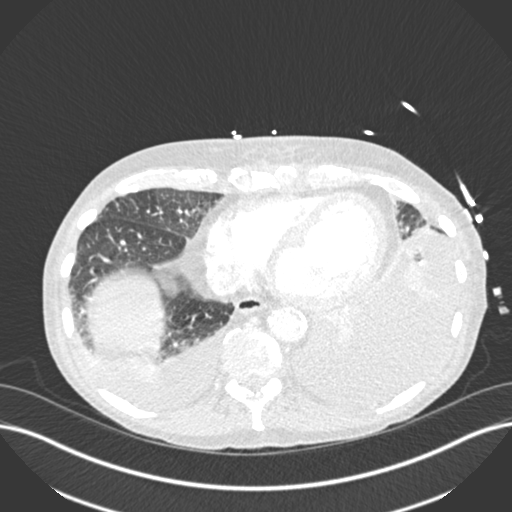
[im 141/359  soft-tissue]
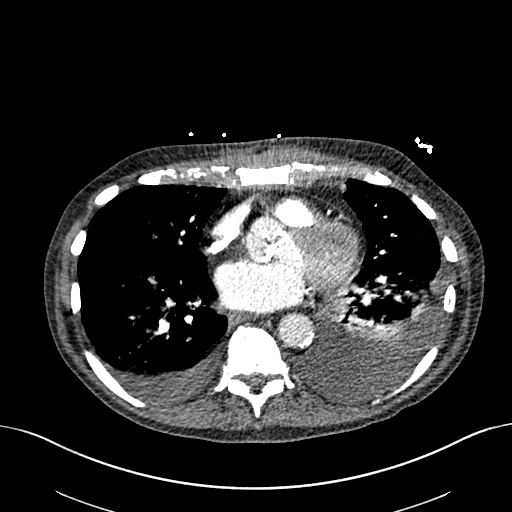
[im 156/359  lung]
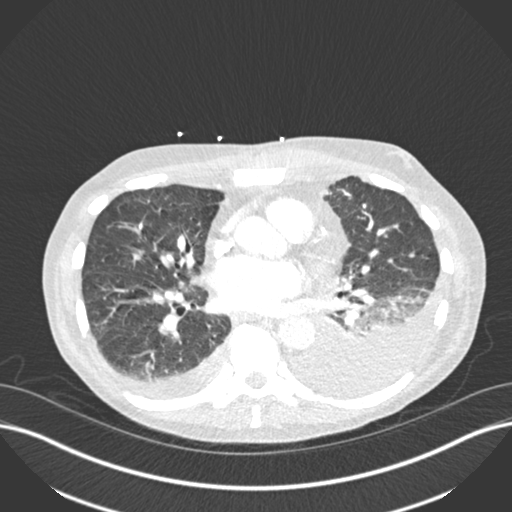
[im 187/359  soft-tissue]
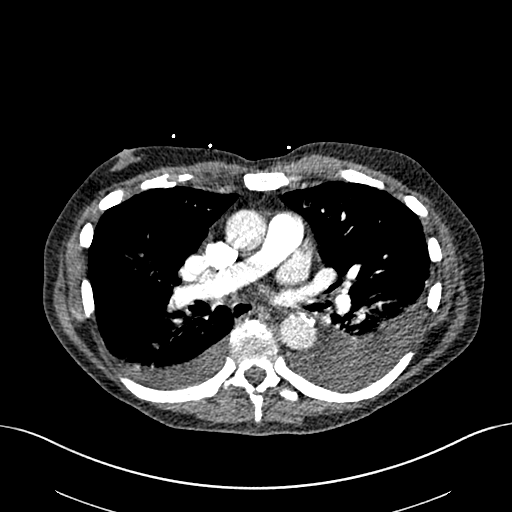
[im 203/359  lung]
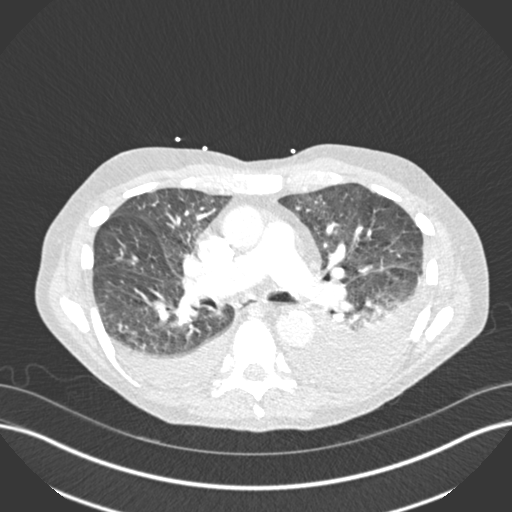
[im 218/359  soft-tissue]
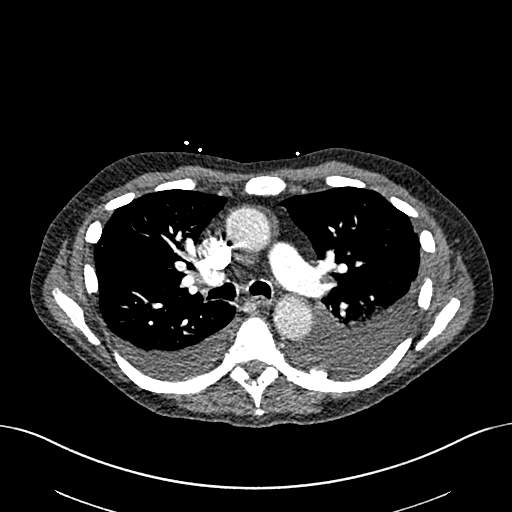
[im 250/359  lung]
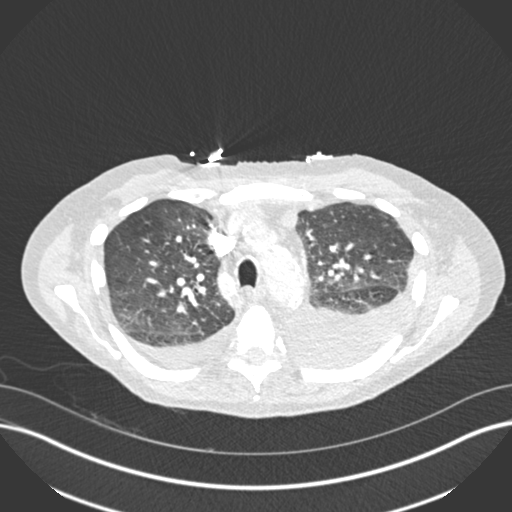
[im 265/359  soft-tissue]
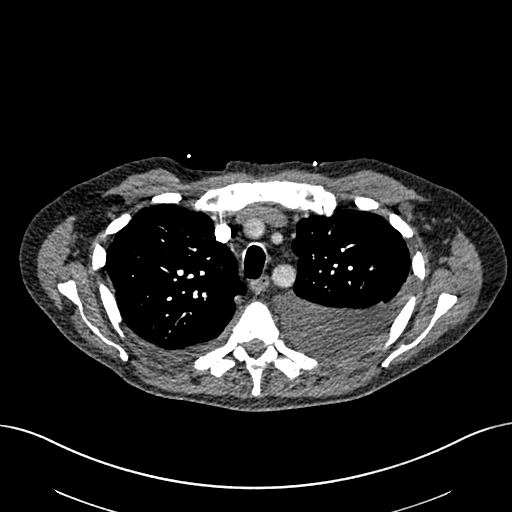
[im 296/359  lung]
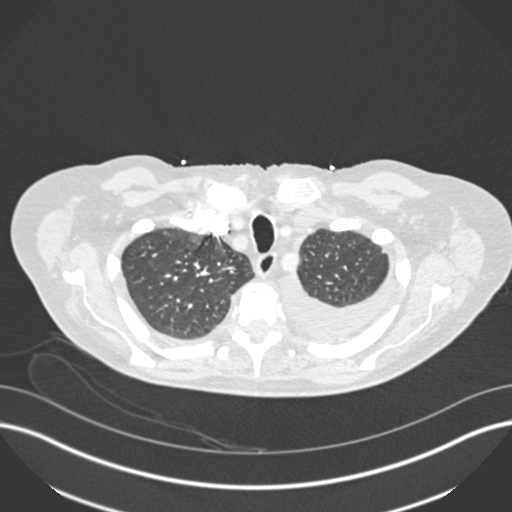
[im 312/359  soft-tissue]
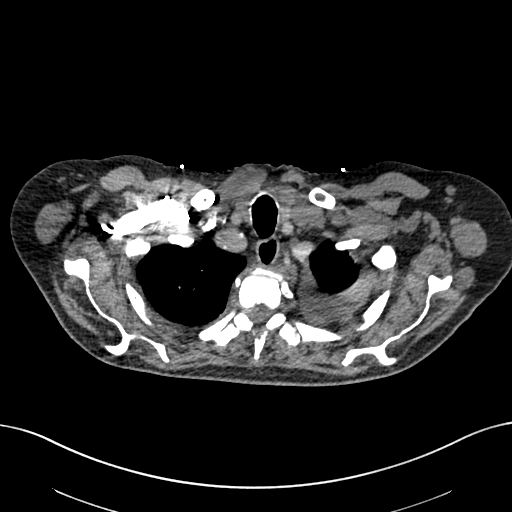
[im 343/359  lung]
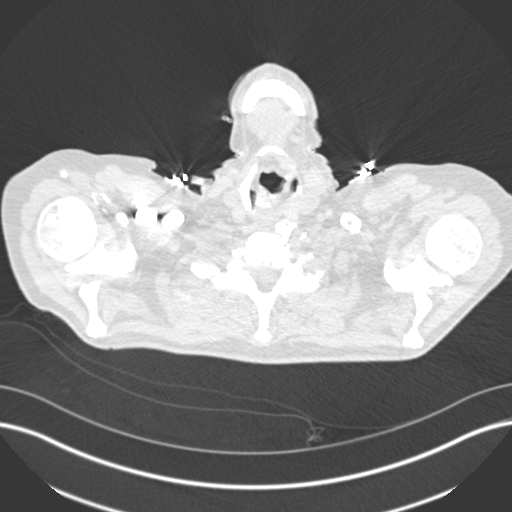

[Series 8: coronal mpr · coronal · 0.73mm/px · 3 of 82 slices shown]
[im 21/82  soft-tissue]
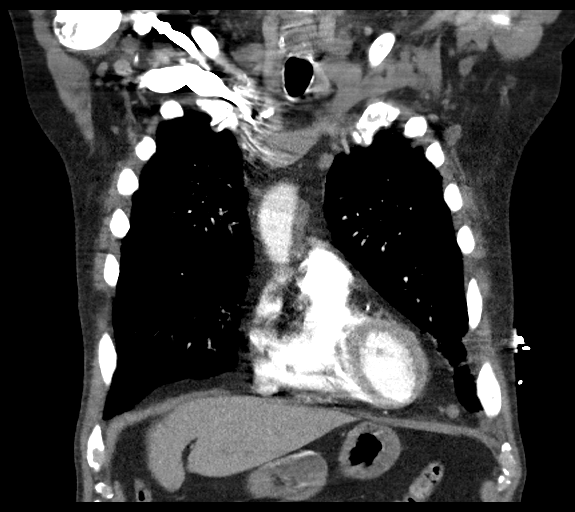
[im 41/82  soft-tissue]
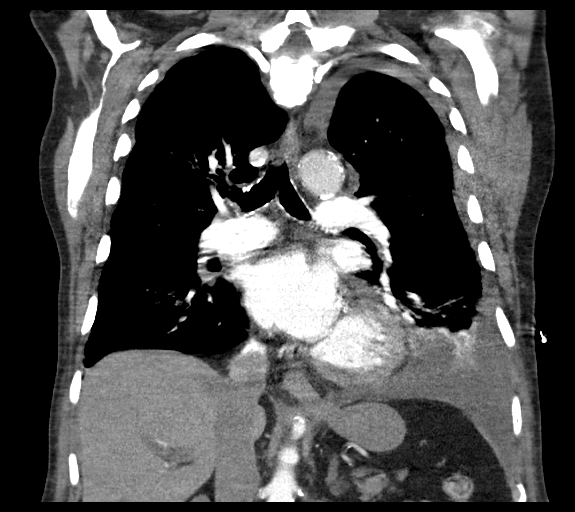
[im 61/82  soft-tissue]
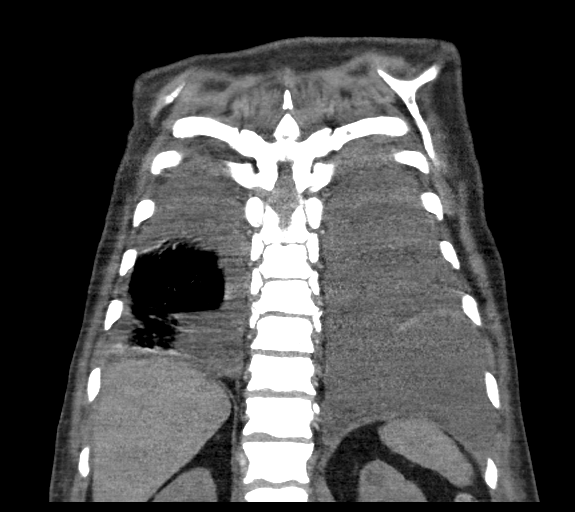

[18 of 46 positions shown; findings below may reference images not displayed]

FINDINGS: Cardiovascular: Satisfactory opacification of the pulmonary arteries
to the segmental level. No evidence of pulmonary embolism. Normal
heart size. No pericardial effusion. Normal caliber thoracic aorta.
Coronary, aortic arch, and branch vessel atherosclerotic vascular
disease.

Mediastinum/Nodes: New mildly enlarged left axillary lymph node,
measuring up to 1.2 cm in short axis. New mildly enlarged
prevascular lymph node, measuring 1.3 cm in short axis. No hilar
lymphadenopathy. The thyroid gland, trachea, and esophagus
demonstrate no significant abnormalities.

Lungs/Pleura: Large right and small left pleural effusions. Right
basilar subsegmental atelectasis. Compressive atelectasis of the
left lower lobe. No consolidation or pneumothorax. No suspicious
pulmonary nodule.

Upper Abdomen: No acute abnormality. New borderline enlarged left
cardiophrenic lymph node.

Musculoskeletal: Diffuse sclerotic metastatic disease throughout the
visible bony thorax is unchanged. No fracture. Mild gynecomastia..

Review of the MIP images confirms the above findings.
IMPRESSION: 1. No evidence of pulmonary embolism.
2. Large left and small right pleural effusions. Resultant
compressive atelectasis of the left lower lobe.
3. New mildly enlarged prevascular and left axillary lymph nodes.
Other axillary lymph nodes have increased in size when compared to
prior study, but remain non-enlarged by CT size criteria. Findings
are concerning for new nodal metastases.
4. Diffuse sclerotic metastatic disease throughout the visible bony
thorax, not significantly changed.
5.  Aortic atherosclerosis ([id number]).
